# Patient Record
Sex: Male | Born: 1950
Health system: Southern US, Community
[De-identification: ages and names within clinical notes are randomized; demographics above are authoritative.]

## PROBLEM LIST (undated history)

## (undated) DIAGNOSIS — Z860100 Personal history of colon polyps, unspecified: Secondary | ICD-10-CM

## (undated) DIAGNOSIS — I480 Paroxysmal atrial fibrillation: Secondary | ICD-10-CM

## (undated) DIAGNOSIS — M75102 Unspecified rotator cuff tear or rupture of left shoulder, not specified as traumatic: Secondary | ICD-10-CM

## (undated) DIAGNOSIS — I2510 Atherosclerotic heart disease of native coronary artery without angina pectoris: Secondary | ICD-10-CM

## (undated) DIAGNOSIS — J309 Allergic rhinitis, unspecified: Secondary | ICD-10-CM

## (undated) DIAGNOSIS — T8859XA Other complications of anesthesia, initial encounter: Secondary | ICD-10-CM

## (undated) DIAGNOSIS — M1990 Unspecified osteoarthritis, unspecified site: Secondary | ICD-10-CM

## (undated) DIAGNOSIS — Z9889 Other specified postprocedural states: Secondary | ICD-10-CM

## (undated) DIAGNOSIS — R112 Nausea with vomiting, unspecified: Secondary | ICD-10-CM

## (undated) DIAGNOSIS — T4145XA Adverse effect of unspecified anesthetic, initial encounter: Secondary | ICD-10-CM

## (undated) HISTORY — PX: CARDIAC CATHETERIZATION: SHX172

## (undated) HISTORY — PX: POLYPECTOMY: SHX149

## (undated) HISTORY — PX: COLONOSCOPY: SHX174

## (undated) HISTORY — DX: Atherosclerotic heart disease of native coronary artery without angina pectoris: I25.10

## (undated) HISTORY — PX: KNEE ARTHROSCOPY: SUR90

---
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
## 1988-11-24 HISTORY — PX: CARPAL TUNNEL RELEASE: SHX101

## 2004-05-10 ENCOUNTER — Encounter: Admission: RE | Admit: 2004-05-10 | Discharge: 2004-05-10 | Payer: Self-pay | Admitting: Family Medicine

## 2008-06-16 ENCOUNTER — Ambulatory Visit: Payer: Self-pay | Admitting: Gastroenterology

## 2008-06-30 ENCOUNTER — Ambulatory Visit: Payer: Self-pay | Admitting: Gastroenterology

## 2008-06-30 ENCOUNTER — Encounter: Payer: Self-pay | Admitting: Gastroenterology

## 2008-07-12 ENCOUNTER — Encounter: Payer: Self-pay | Admitting: Gastroenterology

## 2011-01-03 ENCOUNTER — Ambulatory Visit (INDEPENDENT_AMBULATORY_CARE_PROVIDER_SITE_OTHER): Payer: 59 | Admitting: Internal Medicine

## 2011-01-03 ENCOUNTER — Encounter: Payer: Self-pay | Admitting: Internal Medicine

## 2011-01-03 ENCOUNTER — Encounter (INDEPENDENT_AMBULATORY_CARE_PROVIDER_SITE_OTHER): Payer: Self-pay | Admitting: *Deleted

## 2011-01-03 ENCOUNTER — Other Ambulatory Visit: Payer: Self-pay | Admitting: Internal Medicine

## 2011-01-03 ENCOUNTER — Other Ambulatory Visit: Payer: 59

## 2011-01-03 DIAGNOSIS — Z23 Encounter for immunization: Secondary | ICD-10-CM

## 2011-01-03 DIAGNOSIS — J309 Allergic rhinitis, unspecified: Secondary | ICD-10-CM | POA: Insufficient documentation

## 2011-01-03 DIAGNOSIS — N529 Male erectile dysfunction, unspecified: Secondary | ICD-10-CM | POA: Insufficient documentation

## 2011-01-03 DIAGNOSIS — D126 Benign neoplasm of colon, unspecified: Secondary | ICD-10-CM | POA: Insufficient documentation

## 2011-01-03 DIAGNOSIS — Z0000 Encounter for general adult medical examination without abnormal findings: Secondary | ICD-10-CM

## 2011-01-03 DIAGNOSIS — M1710 Unilateral primary osteoarthritis, unspecified knee: Secondary | ICD-10-CM | POA: Insufficient documentation

## 2011-01-03 DIAGNOSIS — IMO0002 Reserved for concepts with insufficient information to code with codable children: Secondary | ICD-10-CM | POA: Insufficient documentation

## 2011-01-03 DIAGNOSIS — E785 Hyperlipidemia, unspecified: Secondary | ICD-10-CM

## 2011-01-03 DIAGNOSIS — M1990 Unspecified osteoarthritis, unspecified site: Secondary | ICD-10-CM | POA: Insufficient documentation

## 2011-01-03 DIAGNOSIS — Z125 Encounter for screening for malignant neoplasm of prostate: Secondary | ICD-10-CM

## 2011-01-03 LAB — CBC WITH DIFFERENTIAL/PLATELET
Basophils Relative: 0.6 % (ref 0.0–3.0)
Eosinophils Absolute: 0.4 10*3/uL (ref 0.0–0.7)
Eosinophils Relative: 4.6 % (ref 0.0–5.0)
HCT: 47.3 % (ref 39.0–52.0)
MCV: 93.6 fl (ref 78.0–100.0)
Monocytes Relative: 7.1 % (ref 3.0–12.0)
Neutrophils Relative %: 55.6 % (ref 43.0–77.0)
Platelets: 199 10*3/uL (ref 150.0–400.0)

## 2011-01-03 LAB — BASIC METABOLIC PANEL
BUN: 19 mg/dL (ref 6–23)
Calcium: 9.7 mg/dL (ref 8.4–10.5)
Chloride: 102 mEq/L (ref 96–112)
Creatinine, Ser: 1 mg/dL (ref 0.4–1.5)
Glucose, Bld: 92 mg/dL (ref 70–99)
Potassium: 3.9 mEq/L (ref 3.5–5.1)

## 2011-01-03 LAB — LIPID PANEL
Cholesterol: 212 mg/dL — ABNORMAL HIGH (ref 0–200)
Triglycerides: 237 mg/dL — ABNORMAL HIGH (ref 0.0–149.0)

## 2011-01-03 LAB — PSA: PSA: 0.46 ng/mL (ref 0.10–4.00)

## 2011-01-15 NOTE — Assessment & Plan Note (Signed)
Summary: NEW [facility] PT--#--STC   Vital Signs:  Patient profile:   60 year old male Height:      68 inches Weight:      205 pounds BMI:     31.28 O2 Sat:      97 % on Room air Temp:     98.5 degrees F oral Pulse rate:   70 / minute BP sitting:   128 / 82  (left arm) Cuff size:   regular  Vitals Entered By: Bill Salinas CMA (January 03, 2011 1:33 PM)  O2 Flow:  Room air CC: New pt here to est care with primary md/ ab   Primary Care Provider:  Illene Regulus  CC:  New pt here to est care with primary md/ ab.  History of Present Illness: Mr. Lienhard presents to establsih for on-going continuity care.   He has a place in the right inguinal area which is intermittently raised.  He always feels tired and not rested. Some snoring. No report of apnea. Just as tired in the am as at bedtime. does not fall asleep during the day. He also reports some cognitive decline: memory loss and decreased mental edge.   Erectile dysfunction, semi-hard erections.   Preventive Screening-Counseling & Management  Alcohol-Tobacco     Alcohol drinks/day: 0     Smoking Status: never  Caffeine-Diet-Exercise     Caffeine use/day: 5-6 cups daily     Does Patient Exercise: no  Hep-HIV-STD-Contraception     Dental Visit-last 6 months yes     Sun Exposure-Excessive: no  Safety-Violence-Falls     Seat Belt Use: yes     Helmet Use: n/a     Firearms in the Home: firearms in the home     Smoke Detectors: yes     Violence in the Home: no risk noted     Sexual Abuse: no     Fall Risk: low  Current Medications (verified): 1)  None  Allergies (verified): No Known Drug Allergies  Past History:  Past Medical History: UCD-varicella? 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 marriage - good healthSmoking Status:  never Caffeine use/day:  5-6 cups daily Does Patient Exercise:  no Dental Care w/in 6 mos.:  yes Sun Exposure-Excessive:  no Seat Belt Use:  yes Fall Risk:  low  Review of Systems       The patient complains of decreased hearing.  The patient denies anorexia, fever, weight loss, weight gain, vision loss, chest pain, syncope, dyspnea on exertion, peripheral edema, headaches, abdominal pain, severe indigestion/heartburn, incontinence, muscle weakness, difficulty walking, depression, abnormal bleeding, enlarged lymph nodes, and angioedema.    Physical Exam  General:  Well-developed,well-nourished,in no acute distress; alert,appropriate and cooperative throughout examination Head:  normocephalic and atraumatic.   Eyes:  vision grossly intact, pupils equal, pupils round, corneas and lenses clear, and no injection.   Ears:  External ear exam shows no significant lesions or  deformities.  Otoscopic examination reveals clear canals, tympanic membranes are intact bilaterally without bulging, retraction, inflammation or discharge. Hearing is grossly normal bilaterally. Nose:  no external deformity, no external erythema, and no nasal discharge.   Mouth:  Oral mucosa and oropharynx without lesions or exudates.  Teeth in good repair. Neck:  supple, full ROM, no thyromegaly, and no carotid bruits.   Chest Wall:  no deformities.   Lungs:  Normal respiratory effort, chest expands symmetrically. Lungs are clear to auscultation, no crackles or wheezes. Heart:  Normal rate and regular rhythm. S1 and S2 normal without gallop, murmur, click, rub or  other extra sounds. Abdomen:  soft, non-tender, normal bowel sounds, no distention, no guarding, and no rebound tenderness.   Visible bulge right groin. Palpable right inquinal hernia, good size, soft, non-tender and reducible  Rectal:  no external abnormalities and normal sphincter tone.   Prostate:  no gland enlargement, no nodules, no asymmetry, and no induration.   Msk:  normal ROM, no joint tenderness, no joint swelling, no joint warmth, and no joint instability.   Pulses:  2+ radial and DP pulses Extremities:  No clubbing, cyanosis, edema, or deformity noted with normal full range of motion of all joints.   Neurologic:  alert & oriented X3, cranial nerves II-XII intact, strength normal in all extremities, gait normal, and DTRs symmetrical and normal.   Skin:  turgor normal, color normal, no suspicious lesions, and no ulcerations.   Cervical Nodes:  no anterior cervical adenopathy and no posterior cervical adenopathy.   Inguinal Nodes:  no R inguinal adenopathy and no L inguinal adenopathy.   Psych:  Oriented X3, memory intact for recent and remote, normally interactive, good eye contact, and not anxious appearing.     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   Plan - for any joint pain recommend APAP as first line of treatment, to use NSAIDs on if APAP fails.   Problem # 2:  ERECTILE DYSFUNCTION, ORGANIC (ICD-607.84) Able to attain erection but maintaining erection is subpar.   Plan - testosterone level with recommendations to follow.   Addendum- testosterone level is normal. No need for treatment  Problem # 3:  COLONIC POLYPS (ICD-211.3) Patient with a history of colon polyps. Last colonoscopy '09 with Dr. Russella Dar. Will rely on GI call back for next study.  Problem # 4:  ALLERGIC RHINITIS CAUSE UNSPECIFIED (ICD-477.9) Long term problem. If otc non-sedating antihistamines,  e.g. claritin orallegra, don't work can consider nasal inhalational steroids as a treatment.   Problem # 5:  Preventive Health Care (ICD-V70.0) History as outline with no major limiting ailments. Exam is unremarkable. Lab......[facility] for colorectal cancer screening. Normal prostate exam and lab is ordered.  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 He is a candidate for pneumonia and shingles vaccine - will await review of records from his previous doctor.   IN summry - a very nice gentleman who is established for on-going care. He is oriented to the office processes and services provided. He will return as needed or in 4 months for follow-up.  Other Orders: TLB-Lipid Panel (80061-LIPID) TLB-CBC Platelet - w/Differential (85025-CBCD) TLB-BMP (Basic Metabolic Panel-BMET) (80048-METABOL) TLB-TSH (Thyroid Stimulating Hormone) (84443-TSH) TLB-Testosterone, Total (84403-TESTO) TLB-PSA (Prostate Specific Antigen) (84153-PSA) Tdap => 64yrs IM (62831) Admin 1st Vaccine (51761)  Patient: Bradley Gilbert Note: All result  statuses are Final unless otherwise noted.  Tests: (1) Lipid Panel (LIPID)   Cholesterol          [H]  212 mg/dL                   1-191     ATP III Classification            Desirable:  < 200 mg/dL                    Borderline High:  200 - 239 mg/dL               High:  > = 240 mg/dL   Triglycerides        [H]  237.0 mg/dL                 4.7-829.5     Normal:  <150 mg/dL     Borderline High:  621 - 199 mg/dL   HDL                       30.86 mg/dL                 >57.84   VLDL Cholesterol     [H]  47.4 mg/dL                  6.9-62.9  CHO/HDL Ratio:  CHD Risk                             4                    Men          Women     1/2 Average Risk     3.4          3.3     Average Risk          5.0          4.4     2X Average Risk          9.6          7.1     3X Average Risk          15.0          11.0                            Tests: (2) CBC Platelet w/Diff (CBCD)   White Cell Count          8.3 K/uL                    4.5-10.5   Red Cell Count            5.05 Mil/uL                 4.22-5.81   Hemoglobin                16.3 g/dL                   52.8-41.3   Hematocrit                47.3 %                      39.0-52.0   MCV  93.6 fl                     78.0-100.0   MCHC                      34.6 g/dL                   03.4-74.2   RDW                       12.9 %                      11.5-14.6   Platelet Count            199.0 K/uL                  150.0-400.0   Neutrophil %              55.6 %                      43.0-77.0   Lymphocyte %              32.1 %                      12.0-46.0   Monocyte %                7.1 %                       3.0-12.0   Eosinophils%              4.6 %                       0.0-5.0   Basophils %               0.6 %                       0.0-3.0   Neutrophill Absolute      4.6 K/uL                    1.4-7.7   Lymphocyte Absolute       2.7 K/uL                    0.7-4.0   Monocyte Absolute         0.6 K/uL                    0.1-1.0  Eosinophils, Absolute                             0.4 K/uL                    0.0-0.7   Basophils Absolute        0.0 K/uL                    0.0-0.1  Tests: (3) BMP (METABOL)   Sodium                    139 mEq/L                   135-145   Potassium  3.9 mEq/L                   3.5-5.1   Chloride                  102 mEq/L                   96-112   Carbon Dioxide            30 mEq/L                    19-32   Glucose                   92 mg/dL                    16-10   BUN                       19 mg/dL                    9-60   Creatinine                1.0 mg/dL                   4.5-4.0   Calcium                   9.7 mg/dL                   9.8-11.9   GFR                       83.57 mL/min                >60.00  Tests: (4) TSH (TSH)   FastTSH                   1.63  uIU/mL                 0.35-5.50  Tests: (5) Testosterone, Total (TESTO)   Testosterone              416.48 ng/dL                147.82-956.21  Tests: (6) Prostate Specific Antigen (PSA)   PSA-Hyb                   0.46 ng/mL                  0.10-4.00  Tests: (7) Cholesterol LDL - Direct (DIRLDL)  Cholesterol LDL - Direct                             133.1 mg/dL     Optimal:  <308 mg/dL     Near or Above Optimal:  100-129 mg/dL     Borderline High:  657-846 mg/dL     High:  962-952 mg/dL     Very High:  >841 mg/dL  Orders Added: 1)  TLB-Lipid Panel [80061-LIPID] 2)  TLB-CBC Platelet - w/Differential [85025-CBCD] 3)  TLB-BMP (Basic Metabolic Panel-BMET) [80048-METABOL] 4)  TLB-TSH (Thyroid Stimulating Hormone) [84443-TSH] 5)  TLB-Testosterone, Total [84403-TESTO] 6)  TLB-PSA (Prostate Specific Antigen) [84153-PSA] 7)  Tdap => 72yrs IM [90715] 8)  Admin 1st Vaccine [90471] 9)  New Patient 40-64 years [32440]   Immunizations Administered:  Tetanus Vaccine:    Vaccine Type: Tdap    Site: left deltoid    Mfr: GlaxoSmithKline    Dose: 0.5 ml    Route: IM    Given by: Ami Bullins CMA    Exp. Date: 09/12/2012    Lot #: [id number]    VIS given: 10/11/08 version given January 03, 2011.   Immunizations Administered:  Tetanus Vaccine:    Vaccine Type: Tdap    Site: left deltoid    Mfr: GlaxoSmithKline    Dose: 0.5 ml    Route: IM    Given by: Ami Bullins CMA    Exp. Date: 09/12/2012    Lot #: [id number]    VIS given: 10/11/08 version given January 03, 2011.

## 2011-03-14 ENCOUNTER — Ambulatory Visit (INDEPENDENT_AMBULATORY_CARE_PROVIDER_SITE_OTHER): Payer: 59 | Admitting: Internal Medicine

## 2011-03-14 VITALS — BP 132/78 | HR 68 | Temp 98.9°F | Resp 14 | Wt 204.5 lb

## 2011-03-14 DIAGNOSIS — T148 Other injury of unspecified body region: Secondary | ICD-10-CM

## 2011-03-14 DIAGNOSIS — W57XXXA Bitten or stung by nonvenomous insect and other nonvenomous arthropods, initial encounter: Secondary | ICD-10-CM

## 2011-03-14 DIAGNOSIS — G4700 Insomnia, unspecified: Secondary | ICD-10-CM

## 2011-03-14 MED ORDER — DOXYCYCLINE HYCLATE 100 MG PO TABS: 100.0000 mg | ORAL_TABLET | Freq: Two times a day (BID) | ORAL | Status: AC

## 2011-03-16 ENCOUNTER — Encounter: Payer: Self-pay | Admitting: Internal Medicine

## 2011-03-16 NOTE — Progress Notes (Signed)
Subjective:    Patient ID: Bradley Gilbert, male    DOB: 1951-01-06, 60 y.o.   MRN: [id number]  HPI Bradley Gilbert presents with fever, headache and malaise. He has had several tick bites: one on the arm and 3 in the right groin. He has a h/o RMSF and at that time did not have a rash. He has no rash today either. He also reports a very poor sleep habit recently: a duration problem without latency issues.  Past Medical History  Diagnosis Date  . Benign neoplasm of colon   . Allergic rhinitis, cause unspecified   . Osteoarthrosis, unspecified whether generalized or localized, unspecified site   . Osteoarthrosis, unspecified whether generalized or localized, lower leg    No past surgical history on file. Family History  Problem Relation Age of Onset  . Diabetes Mother   . Heart disease Father   . Cancer Father   . COPD Father     throat  . Colon cancer Neg Hx   . Prostate cancer Neg Hx    History   Social History  . Marital Status: Married    Spouse Name: N/A    Number of Children: N/A  . 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 Smoking status: Not on file  . Smokeless tobacco: Not on file  . Alcohol Use: Not on file  . Drug Use: Not on file  . Sexually Active: Not on file   Other Topics Concern  . Not on file   Social History Narrative   Graduate of [employer].  Married - '85.  1 son - '70,  2 daughters - '74, '87;   5 grandchildren.  Hobbies - keeps horses and ponies.  Marriage - good health        Review of Systems Review of Systems  Constitutional: Positive for fever, chill. No change in activity and no unexpected weight change.  HENT:  Negative for hearing loss, ear pain, congestion, neck stiffness and postnasal drip.   Eyes: Negative for pain, discharge and visual disturbance.  Respiratory: Negative for chest tightness and wheezing.   Cardiovascular: Negative for chest pain and  palpitations.       [No decreased exercise tolerance Gastrointestinal: [No change in bowel habit. No bloating or gas. No reflux or indigestion Genitourinary: Negative for urgency, frequency, flank pain and difficulty urinating.  Musculoskeletal:  Positive for myalgias, back pain, arthralgias.  Negative for gait problem.  Neurological: Negative for dizziness, tremors, weakness and headaches.  Hematological: Negative for adenopathy.  Psychiatric/Behavioral: Negative for behavioral problems and dysphoric mood.       Objective:   Physical Exam  Vitals reviewed. Constitutional: He is oriented to person, place, and time. He appears well-developed and well-nourished. No distress.  HENT:  Head: Normocephalic and atraumatic.  Eyes: Conjunctivae and EOM are normal. Pupils are equal, round, and reactive to light.  Neck: Neck supple.  Cardiovascular: Normal rate and regular rhythm.   Pulmonary/Chest: Effort normal and breath sounds normal.  Abdominal: Soft. Bowel sounds are normal. He exhibits no distension. There is no guarding.  Musculoskeletal: Normal range of motion. He exhibits no edema and no tenderness.  Neurological: He is alert and oriented to person, place, and time. He has normal reflexes. No cranial nerve deficit.  Skin: Skin is warm and dry. No rash noted.  Psychiatric: He has a normal mood and affect. Thought content normal.  Assessment & Plan:  1. RMSF - patient with tick exposure and a history of RMSF that presented without rash. He feels his symptoms are very much the same.  Plan - doxycycline 100mg  bid x 7 days.  2. Insomnia - sleep duration issues. Reviewed sleep hygiene with him in detail, especially to avoid extinction behaviors. No medication provided.

## 2011-03-17 ENCOUNTER — Other Ambulatory Visit: Payer: Self-pay | Admitting: Internal Medicine

## 2011-03-17 DIAGNOSIS — K4090 Unilateral inguinal hernia, without obstruction or gangrene, not specified as recurrent: Secondary | ICD-10-CM | POA: Insufficient documentation

## 2011-07-10 ENCOUNTER — Telehealth: Payer: Self-pay | Admitting: *Deleted

## 2011-07-10 MED ORDER — DOXYCYCLINE HYCLATE 100 MG PO TABS: 100.0000 mg | ORAL_TABLET | Freq: Two times a day (BID) | ORAL | Status: AC

## 2011-07-10 NOTE — Telephone Encounter (Signed)
Called patient. He does not know if the ticks had fed - no blood when they were removed from his scalp in the shower. 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

## 2011-07-10 NOTE — Telephone Encounter (Signed)
Pt states that removed 2 ticks from his scalp about 2 weeks ago. He is now having sxs of reoccurring HA and neck stiffness. Pt has OV schedule for tomorrow but wanted to know if ATB could be sent to pharmacy today-please advise

## 2011-07-11 ENCOUNTER — Ambulatory Visit: Payer: 59 | Admitting: Internal Medicine

## 2011-07-18 ENCOUNTER — Telehealth: Payer: Self-pay | Admitting: *Deleted

## 2011-07-18 NOTE — Telephone Encounter (Signed)
Was treated w/ doxycycline for possible RMSF. Agree with advice.

## 2011-07-18 NOTE — Telephone Encounter (Signed)
Pt is scheduled for f/u OV Monday. He was given RX for abx for possible tick illness after a bite. Pt continues to c/o h/a x 2 - 3 wks, stiff neck and back ache. Advised him to call and speak w/on-call service w/any change in symptoms.

## 2011-07-21 ENCOUNTER — Ambulatory Visit (INDEPENDENT_AMBULATORY_CARE_PROVIDER_SITE_OTHER): Payer: 59 | Admitting: Internal Medicine

## 2011-07-21 VITALS — BP 136/84 | HR 63 | Temp 98.4°F | Wt 210.0 lb

## 2011-07-21 DIAGNOSIS — R0609 Other forms of dyspnea: Secondary | ICD-10-CM

## 2011-07-21 DIAGNOSIS — R6889 Other general symptoms and signs: Secondary | ICD-10-CM

## 2011-07-21 DIAGNOSIS — Z136 Encounter for screening for cardiovascular disorders: Secondary | ICD-10-CM

## 2011-07-21 DIAGNOSIS — R0989 Other specified symptoms and signs involving the circulatory and respiratory systems: Secondary | ICD-10-CM

## 2011-07-21 NOTE — Progress Notes (Signed)
Subjective:    Patient ID: Bradley Gilbert, male    DOB: [birth year], 60 y.o.   MRN: [id number]  HPI Mr. Jeudy presents for evaluation of persistent neck stiffness and minor headache.  He did have tick exposure about 2-3 weeks ago and was started on doxycycline 100mg  bid x 7 days starting about 10 days after exposure. He has not had any rash or severe headache or other symptoms of RMSF or Lyme's disease. He has had prior case of RMSF.  Mr. Breton reports that about 10 days ago while out working he experienced the on-set of profound diaphoresis along with weakness. He thought he might have heat exhaustion and went to the house. He reports that he was slow to recover and felt "wiped out" the remainder of the weekend. sinc that time he has had decreased exercise tolerance and endurance is down. He denies any chest pain or discomfort. He will have increase DOE with normal activities. Risk profile is low: no DM, no lipid function elevation, no obese. He is 59.  Past Medical History  Diagnosis Date  . Benign neoplasm of colon   . Allergic rhinitis, cause unspecified   . Osteoarthrosis, unspecified whether generalized or localized, unspecified site   . Osteoarthrosis, unspecified whether generalized or localized, lower leg    No past surgical history on file. Family History  Problem Relation Age of Onset  . Diabetes Mother   . Heart disease Father   . Cancer Father   . COPD Father     throat  . Colon cancer Neg Hx   . Prostate cancer Neg Hx    History   Social History  . Marital Status: Married    Spouse Name: N/A    Number of Children: N/A  . 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 Smoking status: Not on file  . Smokeless tobacco: Not on file  . Alcohol Use: Not on file  . Drug Use: Not on file  . Sexually Active: Not on file   Other Topics Concern  . Not on file   Social History Narrative   Graduate of [employer].  Married - '85.  1 son - '70,  2 daughters - '74, '87;   5 grandchildren.  Hobbies - keeps horses and ponies.  Marriage - good health       Review of Systems Review of Systems  Constitutional:  Negative for fever, chills, activity change and unexpected weight change.  HEENT:  Negative for hearing loss, ear pain, congestion, neck stiffness and postnasal drip. Negative for sore throat or swallowing problems. Negative for dental complaints.   Eyes: Negative for vision loss or change in visual acuity.  Respiratory: Negative for chest tightness and wheezing.   Cardiovascular: Negative for chest pain and palpitation. Positive for decreased exercise tolerance and DOE Gastrointestinal: No change in bowel habit. No bloating or gas. No reflux or indigestion Genitourinary: Negative for urgency, frequency, flank pain and difficulty urinating.  Musculoskeletal: Negative for myalgias, back pain, arthralgias and gait problem.  Neurological: Negative for dizziness, tremors, weakness and headaches.  Hematological: Negative for adenopathy.  Psychiatric/Behavioral: Negative for behavioral problems and dysphoric mood.       Objective:   Physical Exam Vitals noted - stable Gen'l - WNWD white male in no distress HEENT- normal Neck - supple with full ROM but with crepitus with extension and head rolls Chest - CTAP Cor -  2+ radial pulse, RRR, no JVD Abdomen BS+ Extremities - no deformity Derm - no rash or lesions  12 Lead EKG no injury pattern.       Assessment & Plan:  1. 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 Doubt infectious etiology  Plan - ROM exercises daily  2. Decreased exercise tolerance - patient with an event which was exaggerated in terms of heat exhaustion. The combination of symptoms at the time and the persistent decreased exercise tolerance that has persisted are very worrisome for atypical angina.   Plan Myoview stress test.

## 2011-08-11 ENCOUNTER — Ambulatory Visit (HOSPITAL_COMMUNITY): Payer: 59 | Attending: Internal Medicine | Admitting: Radiology

## 2011-08-11 DIAGNOSIS — R0989 Other specified symptoms and signs involving the circulatory and respiratory systems: Secondary | ICD-10-CM | POA: Insufficient documentation

## 2011-08-11 DIAGNOSIS — R0602 Shortness of breath: Secondary | ICD-10-CM

## 2011-08-11 DIAGNOSIS — R6889 Other general symptoms and signs: Secondary | ICD-10-CM

## 2011-08-11 DIAGNOSIS — R0609 Other forms of dyspnea: Secondary | ICD-10-CM | POA: Insufficient documentation

## 2011-08-11 MED ORDER — TECHNETIUM TC 99M TETROFOSMIN IV KIT
33.0000 | PACK | Freq: Once | INTRAVENOUS | Status: AC | PRN
  Administered 2011-08-11: 33 via INTRAVENOUS

## 2011-08-11 MED ORDER — TECHNETIUM TC 99M TETROFOSMIN IV KIT
11.0000 | PACK | Freq: Once | INTRAVENOUS | Status: AC | PRN
  Administered 2011-08-11: 11 via INTRAVENOUS

## 2011-08-11 NOTE — Progress Notes (Signed)
[facility] SITE 3 NUCLEAR MED [address] [place] [address] [phone]  Cardiology Nuclear Med Study  Bradley Gilbert is a 60 y.o. male [id number] 04-Aug-1951   Nuclear Med Background Indication for Stress Test:  Evaluation for Ischemia History:  No previous documented CAD Cardiac Risk Factors: Family History - CAD  Symptoms:  Diaphoresis, DOE and Fatigue   Nuclear Pre-Procedure Caffeine/Decaff Intake:  None NPO After: 7:00pm   Lungs:  Clear. IV 0.9% NS with Angio Cath:  20g  IV Site: R Antecubital x 1, tolerated well IV Started by:  Irean Hong, RN  Chest Size (in):  42 Cup Size: n/a  Height: 5\' 10"  (1.778 m)  Weight:  204 lb (92.534 kg)  BMI:  Body mass index is 29.27 kg/(m^2). Tech Comments:  n/a    Nuclear Med Study 1 or 2 day study: 1 day  Stress Test Type:  Stress  Reading MD: Olga Millers, MD  Order Authorizing Provider:  Illene Regulus, MD  Resting Radionuclide: Technetium 54m Tetrofosmin  Resting Radionuclide Dose: 11 mCi   Stress Radionuclide:  Technetium 48m Tetrofosmin  Stress Radionuclide Dose: 32.2 mCi           Stress Protocol Rest HR: 61 Stress HR: 153  Rest BP: 134/81 Stress BP: 181/74  Exercise Time (min): 10:01 METS: 11.7   Predicted Max HR: 159 bpm % Max HR: 96.23 bpm Rate Pressure Product: 46962   Dose of Adenosine (mg):  n/a Dose of Lexiscan: n/a mg  Dose of Atropine (mg): n/a Dose of Dobutamine: n/a mcg/kg/min (at max HR)  Stress Test Technologist: Smiley Houseman, CMA-N  Nuclear Technologist:  Domenic Polite, CNMT     Rest Procedure:  Myocardial perfusion imaging was performed at rest 45 minutes following the intravenous administration of Technetium 31m Tetrofosmin.  Rest ECG: No acute changes, rare PVC.  Stress Procedure:  The patient exercised for 10:01 on the treadmill utilizing the Bruce protocol.  The patient stopped due to fatigue and denied any chest pain.  There were marked ST-T wave changes with  occasional PVC's and couplets.  ST changes quickly resolved.  Technetium 82m Tetrofosmin was injected at peak exercise and myocardial perfusion imaging was performed after a brief delay.  Stress ECG: Significant ST abnormalities consistent with ischemia.  QPS Raw Data Images:  Acquisition technically good; normal left ventricular size. Stress Images:  There is decreased uptake in the inferior wall. Rest Images:  Normal homogeneous uptake in all areas of the myocardium. Subtraction (SDS):  These findings are consistent with mild inferior ischemia noted on the short axis images only. Transient Ischemic Dilatation (Normal <1.22): .84 Lung/Heart Ratio (Normal <0.45):  .34  Quantitative Gated Spect Images QGS EDV:  102 ml QGS ESV:  34 ml QGS cine images:  NL LV Function; NL Wall Motion QGS EF: 66%  Impression Exercise Capacity:  Good exercise capacity. BP Response:  Normal blood pressure response. Clinical Symptoms:  No chest pain. ECG Impression: Significant ST abnormalities consistent with ischemia. Comparison with Prior Nuclear Study: No previous nuclear studies. Abnormal Stress nuclear study Olga Millers

## 2011-08-12 ENCOUNTER — Telehealth: Payer: Self-pay | Admitting: *Deleted

## 2011-08-12 DIAGNOSIS — R9439 Abnormal result of other cardiovascular function study: Secondary | ICD-10-CM

## 2011-08-12 NOTE — Telephone Encounter (Signed)
Card cons Dx abn CL Done Thx

## 2011-08-12 NOTE — Telephone Encounter (Signed)
[facility] [facility]ology called to notify MD of abnormal stress test. Results are ready, please advise in Dr Debby Bud absence.

## 2011-08-13 NOTE — Telephone Encounter (Signed)
Pt scheduled for Apt w/Cardiology on 9/26.

## 2011-08-13 NOTE — Telephone Encounter (Signed)
Patient informed and PCC's notified

## 2011-08-15 ENCOUNTER — Telehealth: Payer: Self-pay | Admitting: Cardiovascular Disease

## 2011-08-15 ENCOUNTER — Emergency Department (HOSPITAL_COMMUNITY): Payer: 59

## 2011-08-15 ENCOUNTER — Observation Stay (HOSPITAL_COMMUNITY)
Admission: EM | Admit: 2011-08-15 | Discharge: 2011-08-16 | Disposition: A | Discharge status: Home / Self Care | Payer: 59 | Attending: Internal Medicine | Admitting: Internal Medicine

## 2011-08-15 ENCOUNTER — Telehealth: Payer: Self-pay | Admitting: *Deleted

## 2011-08-15 DIAGNOSIS — R61 Generalized hyperhidrosis: Secondary | ICD-10-CM | POA: Insufficient documentation

## 2011-08-15 DIAGNOSIS — Z01818 Encounter for other preprocedural examination: Secondary | ICD-10-CM | POA: Insufficient documentation

## 2011-08-15 DIAGNOSIS — R079 Chest pain, unspecified: Principal | ICD-10-CM | POA: Insufficient documentation

## 2011-08-15 DIAGNOSIS — R42 Dizziness and giddiness: Secondary | ICD-10-CM | POA: Insufficient documentation

## 2011-08-15 DIAGNOSIS — Z01812 Encounter for preprocedural laboratory examination: Secondary | ICD-10-CM | POA: Insufficient documentation

## 2011-08-15 DIAGNOSIS — R5381 Other malaise: Secondary | ICD-10-CM | POA: Insufficient documentation

## 2011-08-15 DIAGNOSIS — R9439 Abnormal result of other cardiovascular function study: Secondary | ICD-10-CM | POA: Insufficient documentation

## 2011-08-15 DIAGNOSIS — R0602 Shortness of breath: Secondary | ICD-10-CM | POA: Insufficient documentation

## 2011-08-15 DIAGNOSIS — F29 Unspecified psychosis not due to a substance or known physiological condition: Secondary | ICD-10-CM | POA: Insufficient documentation

## 2011-08-15 LAB — PROTIME-INR
INR: 1.18 (ref 0.00–1.49)
Prothrombin Time: 15.3 seconds — ABNORMAL HIGH (ref 11.6–15.2)

## 2011-08-15 LAB — COMPREHENSIVE METABOLIC PANEL
ALT: 25 U/L (ref 0–53)
AST: 20 U/L (ref 0–37)
BUN: 14 mg/dL (ref 6–23)
Calcium: 9.3 mg/dL (ref 8.4–10.5)
Chloride: 104 mEq/L (ref 96–112)
Creatinine, Ser: 0.94 mg/dL (ref 0.50–1.35)
GFR calc Af Amer: >60 mL/min (ref >60)

## 2011-08-15 LAB — CBC
HCT: 42.2 % (ref 39.0–52.0)
MCHC: 37 g/dL — ABNORMAL HIGH (ref 30.0–36.0)
RBC: 4.84 MIL/uL (ref 4.22–5.81)

## 2011-08-15 LAB — APTT: aPTT: 132 seconds — ABNORMAL HIGH (ref 24–37)

## 2011-08-15 LAB — D-DIMER, QUANTITATIVE: D-Dimer, Quant: <0.22 ug/mL-FEU (ref 0.00–0.48)

## 2011-08-15 LAB — POCT I-STAT TROPONIN I

## 2011-08-15 NOTE — Telephone Encounter (Signed)
Pt and wife walked into the office. He c/o slight dizziness, lightheadedness, "gas in chest", slight chest pressure in center of chest, "jittery" x 1 hr. NO pain or SOB. Pt had abnormal stress test Monday 9/20 and scheduled for cardiology eval Monday 9/24. I advised pt to go to ER 5 minutes after he walked into the office. Gave him copy of stress test to take with him to the ER. Also advised them if he was admitted to ensure he was put on Dr Debby Bud or covering MD's service.   Wife will call office later with update.

## 2011-08-15 NOTE — Telephone Encounter (Signed)
Pt came to PCP's office, Wife is taking him to ER at [facility] now.

## 2011-08-15 NOTE — Telephone Encounter (Signed)
9/21--pt calling stating having funny feeling in chest, diaphoresis, and red face?--when i attempted to reach pt there was no answer on home or cell phone--LM on both numbers for pt to go to nearest ED  And have this checked out--nt

## 2011-08-15 NOTE — Telephone Encounter (Signed)
Pt calling stating that he is not feeling well. Pt c/o sweating and red face and pt feel funny in his chest. Pt wanted to come see Dr. Clifton James today. Pt was made aware he was not in the office today but wanted to speak to someone to be advised as to what to do.

## 2011-08-16 LAB — CBC
HCT: 40.6 % (ref 39.0–52.0)
MCH: 31.7 pg (ref 26.0–34.0)
MCHC: 35.7 g/dL (ref 30.0–36.0)
MCV: 88.6 fL (ref 78.0–100.0)
Platelets: 177 10*3/uL (ref 150–400)
RDW: 12.7 % (ref 11.5–15.5)

## 2011-08-16 LAB — BASIC METABOLIC PANEL
CO2: 26 mEq/L (ref 19–32)
GFR calc Af Amer: >60 mL/min (ref >60)
GFR calc non Af Amer: >60 mL/min (ref >60)
Sodium: 138 mEq/L (ref 135–145)

## 2011-08-16 LAB — LIPID PANEL
Cholesterol: 152 mg/dL (ref 0–200)
HDL: 40 mg/dL (ref >39)
Total CHOL/HDL Ratio: 3.8 RATIO

## 2011-08-16 LAB — TSH: TSH: 1.54 u[IU]/mL (ref 0.350–4.500)

## 2011-08-16 LAB — CARDIAC PANEL(CRET KIN+CKTOT+MB+TROPI)
Relative Index: INVALID (ref 0.0–2.5)
Troponin I: <0.3 ng/mL (ref <0.30)

## 2011-08-16 LAB — HEMOGLOBIN A1C: Mean Plasma Glucose: 123 mg/dL — ABNORMAL HIGH (ref <117)

## 2011-08-17 NOTE — Telephone Encounter (Signed)
Patient was admitted, had cath w/o obstructive disease. Does he already have post-hospital follow-up with cardiology? With me?

## 2011-08-17 NOTE — Telephone Encounter (Signed)
Admitted, cathed, discharged. See previous phone note

## 2011-08-18 ENCOUNTER — Other Ambulatory Visit: Payer: Self-pay | Admitting: *Deleted

## 2011-08-18 DIAGNOSIS — R0602 Shortness of breath: Secondary | ICD-10-CM

## 2011-08-18 NOTE — Telephone Encounter (Signed)
Reviewed hospital course. I have not seen this pt before. He was scheduled to see me for initial visit. Cath with mild CAD. cdm

## 2011-08-19 ENCOUNTER — Ambulatory Visit (HOSPITAL_COMMUNITY): Payer: 59 | Attending: Cardiology

## 2011-08-19 ENCOUNTER — Encounter: Payer: Self-pay | Admitting: *Deleted

## 2011-08-19 DIAGNOSIS — R5381 Other malaise: Secondary | ICD-10-CM | POA: Insufficient documentation

## 2011-08-19 DIAGNOSIS — R0602 Shortness of breath: Secondary | ICD-10-CM

## 2011-08-19 DIAGNOSIS — I079 Rheumatic tricuspid valve disease, unspecified: Secondary | ICD-10-CM | POA: Insufficient documentation

## 2011-08-19 DIAGNOSIS — R61 Generalized hyperhidrosis: Secondary | ICD-10-CM | POA: Insufficient documentation

## 2011-08-19 DIAGNOSIS — I059 Rheumatic mitral valve disease, unspecified: Secondary | ICD-10-CM | POA: Insufficient documentation

## 2011-08-19 DIAGNOSIS — R0989 Other specified symptoms and signs involving the circulatory and respiratory systems: Secondary | ICD-10-CM | POA: Insufficient documentation

## 2011-08-19 DIAGNOSIS — R0609 Other forms of dyspnea: Secondary | ICD-10-CM | POA: Insufficient documentation

## 2011-08-19 HISTORY — PX: TRANSTHORACIC ECHOCARDIOGRAM: SHX275

## 2011-08-20 ENCOUNTER — Encounter: Payer: Self-pay | Admitting: Cardiovascular Disease

## 2011-08-20 ENCOUNTER — Encounter: Payer: Self-pay | Admitting: *Deleted

## 2011-08-20 ENCOUNTER — Ambulatory Visit (INDEPENDENT_AMBULATORY_CARE_PROVIDER_SITE_OTHER): Payer: 59 | Admitting: Cardiovascular Disease

## 2011-08-20 VITALS — BP 136/88 | HR 86 | Ht 70.0 in | Wt 210.1 lb

## 2011-08-20 DIAGNOSIS — E785 Hyperlipidemia, unspecified: Secondary | ICD-10-CM | POA: Insufficient documentation

## 2011-08-20 DIAGNOSIS — E7800 Pure hypercholesterolemia, unspecified: Secondary | ICD-10-CM | POA: Insufficient documentation

## 2011-08-20 DIAGNOSIS — I2510 Atherosclerotic heart disease of native coronary artery without angina pectoris: Secondary | ICD-10-CM

## 2011-08-20 MED ORDER — ROSUVASTATIN CALCIUM 10 MG PO TABS: 10.0000 mg | ORAL_TABLET | Freq: Every day | ORAL | Status: DC

## 2011-08-20 NOTE — Progress Notes (Signed)
History of Present Illness:59 yo male with recent admission to [facility] 08/15/11 with c/o chest pain. He had an abnormal stress myoview earlier this month per primary care that showed possible inferior wall ischemia. Cardiac cath 08/15/11 with mild non-obstructive CAD. (30% mid LAD, o/w no CAD).   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 He had been drinking plenty of fluids. After this episode, the stress myoview was arranged by Dr. Debby Bud. His stress myoview on 08/11/11 showed normal LV function with mild dropout of the inferior wall with stress c/w ischemia. He had an episode of chest pain 08/15/11 which was concerning and because of this he went into the ED. Cath was later that day. Echo yesterday with normal LV function, no valvular disease. He was discharged on 08/16/11 and has done well since then.   No chest pain, SOB, palpitations.   Past Medical History  Diagnosis Date  . Benign neoplasm of colon   . Allergic rhinitis, cause unspecified   . Osteoarthrosis, unspecified whether generalized or localized, unspecified site   . Osteoarthrosis, unspecified whether generalized or localized, lower leg   . Coronary artery disease     Cath 08/15/11 with 30% mid LAD, o/w no CAD    Past Surgical History  Procedure Date  . Knee surgery     Current Outpatient Prescriptions  Medication Sig Dispense Refill  . aspirin 81 MG tablet Take 81 mg by mouth as needed.        . NON FORMULARY NO MEDICATIONS         No Known Allergies  History   Social History  . Marital Status: Married    Spouse Name: N/A    Number of Children: N/A  . 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 Smoking status: Never Smoker   . Smokeless tobacco: Not on file  . Alcohol Use: Not on file  . Drug Use: Not on file  . Sexually Active: Not on file   Other Topics  Concern  . Not on file   Social History Narrative   Graduate of [employer].  Married - '85.  1 son - '70,  2 daughters - '74, '87;   5 grandchildren.  Hobbies - keeps horses and ponies.  Marriage - good health    Family History  Problem Relation Age of Onset  . Diabetes Mother   . Heart disease Father   . Cancer Father   . COPD Father     throat  . Colon cancer Neg Hx   . Prostate cancer Neg Hx     Review of Systems:  As stated in the HPI and otherwise negative.   BP 136/88  Pulse 86  Ht 5\' 10"  (1.778 m)  Wt 210 lb 1.9 oz (95.31 kg)  BMI 30.15 kg/m2  Physical Examination: General: Well developed, well nourished, NAD HEENT: OP clear, mucus membranes moist SKIN: warm, dry. No rashes. Neuro: No focal deficits Musculoskeletal: Muscle strength 5/5 all ext Psychiatric: Mood and affect normal Neck: No JVD, no carotid bruits, no thyromegaly, no lymphadenopathy. Lungs:Clear bilaterally, no wheezes, rhonci, crackles Cardiovascular: Regular rate and rhythm. No murmurs, gallops or rubs. Abdomen:Soft. Bowel sounds present. Non-tender.  Extremities: No lower extremity edema. Pulses are 2 + in the bilateral DP/PT.  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  There was separate ostia for the   LAD and the left circumflex.      LAD:  A long vessel coursing to the apex.  It gave off two diagonals.   There was a 30% lesion in the midportion of the LAD right after the   second diagonal.      Left circumflex was a very large dominant vessel, gave off two large   OMs.  The large PL and the large PeA was angiographically normal.      The right coronary artery was a small nondominant vessel with no   obstruction.      Left ventriculogram done in the RAO position showed an EF of 60-65% with   no regional wall motion abnormalities.      Echo 08/19/11:  Left ventricle: The cavity size was normal. Wall thickness was increased in a pattern of mild LVH. Systolic  function was normal. The estimated ejection fraction was in the range of 60% to 65%. Wall motion was normal; there were no regional wall motion abnormalities. Doppler parameters are consistent with abnormal left ventricular relaxation (grade 1 diastolic dysfunction). - Aortic valve: There was no stenosis. - Mitral valve: Trivial regurgitation. - Right ventricle: The cavity size was normal. Systolic function was normal. - Pulmonary arteries: PA peak pressure: 25mm Hg (S). - Inferior vena cava: The vessel was normal in size; the respirophasic diameter changes were in the normal range (= 50%); findings are consistent with normal central venous pressure. Impressions:  - Normal LV size and systolic function, EF 60-65%. Normal RV size and systolic function. No significant valvular abnormalities.

## 2011-08-20 NOTE — Patient Instructions (Signed)
Your physician wants you to follow-up in: 12 months.  You will receive a reminder letter in the mail two months in advance. If you don't receive a letter, please call our office to schedule the follow-up appointment.  Your physician has recommended you make the following change in your medication: Start Crestor 10 mg by mouth daily.  Your physician recommends that you return for fasting lab work in: 3 months--Last week of December or first week in [place] and Liver profile

## 2011-08-20 NOTE — Assessment & Plan Note (Signed)
Stable. No recurrence of chest pain. Will add statin (Crestor 10 mg po Qdaily) and have him start ASA 81 mg po Qdaily. Heart healthy diet and daily exercise encouraged.

## 2011-08-20 NOTE — Assessment & Plan Note (Signed)
Start statin. Repeat LFTs and lipids in 3 months. Goal LDL 70.

## 2011-08-21 ENCOUNTER — Encounter: Payer: Self-pay | Admitting: *Deleted

## 2011-08-21 NOTE — H&P (Addendum)
NAME:  Bradley Gilbert, Bradley Gilbert NO.:  [id number]  MEDICAL RECORD NO.:  [id number]  LOCATION:  2508                         FACILITY:  [facility]  PHYSICIAN:  Bevelyn Buckles. Travia Onstad, MDDATE OF BIRTH:  1951/06/26  DATE OF ADMISSION:  08/15/2011 DATE OF DISCHARGE:                             HISTORY & PHYSICAL   PRIMARY CARDIOLOGIST:  The patient was scheduled to see Dr. Clifton James on August 20, 2011.  PRIMARY MEDICAL DOCTOR:  Rosalyn Gess. Norins, MD  CHIEF COMPLAINTS:  Chest pain.  HISTORY OF PRESENT ILLNESS:  Mr. 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  He developed diaphoresis, dizziness, some confusion, shortness of breath, and extreme fatigue during that episode.  Nuclear study showed significant ST abnormalities consistent with ischemia with mild inferior ischemia noted on the short axis images.  Today, the patient had a general sense that something was wrong from when he woke up.  He felt diaphoretic with chest pressure and shortness of breath. These symptoms are worse with exertion.  He also notes intermittent indigestion and a great increase in exhaustion recently.  He went to his PCP and was referred to the ER.  EKG shows nonspecific ST-T changes and troponin is negative with point-of-care.  The patient is pain free.  PAST MEDICAL HISTORY:  Abnormal nuclear stress on August 11, 2011, as above with normal EF of 60%-6%.  PAST SURGICAL HISTORY:  Knee surgery.  MEDICATIONS: 1. Aspirin 325 mg daily. 2. Ibuprofen p.r.n.  ALLERGIES:  No known drug allergies.  SOCIAL HISTORY:  Mr. Esco is the climax.  He is married with 3 children.  He works at [employer], but denies any history of smoking cigarettes.  He does not drink any  alcohol.  FAMILY HISTORY:  Mother is living at 51, does not necessarily have coronary artery disease that he knows of, but thinks that there may be some on her side.  Father died at 9 of an MI.  He has 4 sisters in relatively good health.  REVIEW OF SYSTEMS:  Positive for sweats.  Negative for fevers, chills, nausea, vomiting, diarrhea, bright red blood per rectum, melena, or hematemesis.  No bleeding problems in the past or stroke.  All other systems reviewed and otherwise negative.  LABORATORY DATA:  WBC 6.2, hemoglobin 15.6, hematocrit 42.2, platelet count 185.  Sodium 138, potassium 2.8, chloride 104, CO2 of 24, glucose 116, BUN 14, creatinine 0.94.  Troponin is negative with point-of-care. LFTs are normal.  EKG shows normal sinus rhythm with hyperacute T-waves in V2 through V4.  RADIOLOGIC STUDIES:  Chest x-ray showed no active disease.  PHYSICAL EXAMINATION:  VITAL SIGNS:  Temperature 97.5, pulse 73, respirations 15, blood pressure 140/83, pulse ox 100% on room air. GENERAL:  This is a pleasant white male in no acute distress. HEENT:  Normocephalic, atraumatic with extraocular movements intact. Clear sclerae.  Nares are without discharge. NECK:  Supple without carotid bruit. HEART:  Auscultation to the heart reveals regular rate and  rhythm with S1 and S2 without murmurs, rubs, or gallops. LUNGS:  Clear to auscultation bilaterally without wheezes, rales, or rhonchi. ABDOMEN:  Soft, nontender, nondistended.  Positive bowel sounds. EXTREMITIES:  Warm, dry, and without edema. NEUROLOGIC:  He is alert and oriented x3 and responds TO questions appropriately with a normal affect.  ASSESSMENT AND PLAN:  . The patient was seen and examined by Dr. Gala Romney and myself.  This is a 60 year old gentleman with no formal diagnosis of coronary artery disease, but an abnormal nuclear stress test several days ago, who presents with typical unstable angina symptoms that are worse  with exertion, improved with rest, and very concerning for tight coronary blockage.  At this time, we recommend to proceed with cardiac catheterization this afternoon.  We will also proceed to treat him with aspirin, beta-blockade, heparin, and initiate statin.  Other routine lab work including A1c and fasting lipid panels will be checked.  Plan was discussed with the patient including risks, benefits, and alternatives and the patient is agreeable to proceeding.     Dayna Dunn, P.A.C.   ______________________________ Bevelyn Buckles. Jaylise Peek, MD    DD/MEDQ  D:  08/15/2011  T:  08/15/2011  Job:  [id number]  cc:   Verne Carrow, MD Rosalyn Gess. Norins, MD  Electronically Signed by Ronie Spies  on 08/21/2011 07:08:14 PM Electronically Signed by Arvilla Meres MD on 09/07/2011 02:44:32 PM

## 2011-08-21 NOTE — Telephone Encounter (Signed)
Pt was seen by cardiology on 9/26 and I scheduled f/u with Dr Debby Bud on 10/12 at 1.

## 2011-08-27 ENCOUNTER — Telehealth (INDEPENDENT_AMBULATORY_CARE_PROVIDER_SITE_OTHER): Payer: Self-pay

## 2011-08-27 NOTE — Telephone Encounter (Signed)
Called pt to notify him that I did ask Dr Michaell Cowing about getting his pain medicine the day before his surgery  And per Dr Michaell Cowing he does not prescribe pain medicine until the day of surgery./ [facility]

## 2011-09-01 ENCOUNTER — Telehealth (INDEPENDENT_AMBULATORY_CARE_PROVIDER_SITE_OTHER): Payer: Self-pay

## 2011-09-01 NOTE — Telephone Encounter (Signed)
LM with pt's wife to notify pt to stop his aspirin 5 days before his surgery planned with Dr Michaell Cowing.Hulda Humphrey

## 2011-09-05 ENCOUNTER — Ambulatory Visit: Payer: 59 | Admitting: Internal Medicine

## 2011-09-05 DIAGNOSIS — K4090 Unilateral inguinal hernia, without obstruction or gangrene, not specified as recurrent: Secondary | ICD-10-CM

## 2011-09-05 HISTORY — PX: LAPAROSCOPIC INGUINAL HERNIA REPAIR: SUR788

## 2011-09-07 NOTE — Cardiovascular Report (Signed)
  NAME:  ZAKKERY, DORIAN NO.:  [id number]  MEDICAL RECORD NO.:  [id number]  LOCATION:  2807                         FACILITY:  [facility]  PHYSICIAN:  Bevelyn Buckles. Letisia Schwalb, MDDATE OF BIRTH:  1951/11/10  DATE OF PROCEDURE: DATE OF DISCHARGE:                           CARDIAC CATHETERIZATION   PRIMARY CARE PHYSICIAN:  Rosalyn Gess. Norins, MD.  PATIENT IDENTIFICATION:  Bradley Gilbert is a very pleasant 60 year old male who has several-week history of episodes of dyspnea and presyncope.  He had a stress test, which suggested inferior ischemia.  He presented today with worsening dyspnea.  Cardiac markers and EKG were normal. Given his symptoms and positive stress test, he is brought for cardiac catheterization.  PROCEDURES PERFORMED: 1. Selective coronary angiography. 2. Left heart catheterization. 3. Left ventriculogram.  DESCRIPTION OF PROCEDURE:  The risks and indications were explained. Consent was signed, and placed on the chart.  After confirmation of a normal Allen's test,  the right wrist area was prepped and draped in routine sterile fashion, anesthetized with 1% local lidocaine.  A 5- French arterial sheath was placed using a modified Seldinger technique. 4500 units of systemic heparin was given, and 3 mg of intra-arterial verapamil were administered.  Standard catheters including JL-3.5, JR-4, and a straight pigtail were used,  No apparent complications.  Central aortic pressure of 117/71 with a mean of 92.  LV pressure 128 over zero with an EDP of 11.  There is no aortic stenosis.  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  There was separate ostia for the LAD and the left circumflex.  LAD:  A long vessel coursing to the apex.  It gave off two diagonals. There was a 30% lesion in the midportion of the LAD right after the second diagonal.  Left circumflex was a very large dominant vessel, gave off two large OMs.  The large PL and the large PeA was  angiographically normal.  The right coronary artery was a small nondominant vessel with no obstruction.  Left ventriculogram done in the RAO position showed an EF of 60-65% with no regional wall motion abnormalities.  ASSESSMENT: 1. Minimal nonobstructive coronary artery disease as described above. 2. Normal left ventricular function.  DISCUSSION:  Mr. Maselli stress test seems to be a false positive.  We will check a D-dimer and echo.  If all these are normal he can be positively discharged home in the a.m.  If the symptoms persist, we can consider an outpatient chest CT.  We will start him empirically on proton pump inhibitor.     Bevelyn Buckles. Jalaine Riggenbach, MD     DRB/MEDQ  D:  08/15/2011  T:  08/15/2011  Job:  [id number]  cc:   Rosalyn Gess. Norins, MD  Electronically Signed by Arvilla Meres MD on 09/07/2011 02:44:30 PM

## 2011-09-09 ENCOUNTER — Encounter (INDEPENDENT_AMBULATORY_CARE_PROVIDER_SITE_OTHER): Payer: Self-pay

## 2011-09-11 ENCOUNTER — Encounter (INDEPENDENT_AMBULATORY_CARE_PROVIDER_SITE_OTHER): Payer: Self-pay

## 2011-09-23 ENCOUNTER — Ambulatory Visit (INDEPENDENT_AMBULATORY_CARE_PROVIDER_SITE_OTHER): Payer: 59 | Admitting: Surgery

## 2011-09-23 ENCOUNTER — Encounter (INDEPENDENT_AMBULATORY_CARE_PROVIDER_SITE_OTHER): Payer: Self-pay | Admitting: Surgery

## 2011-09-23 VITALS — BP 138/86 | HR 72 | Temp 96.2°F | Resp 20 | Ht 70.0 in | Wt 208.2 lb

## 2011-09-23 DIAGNOSIS — K4090 Unilateral inguinal hernia, without obstruction or gangrene, not specified as recurrent: Secondary | ICD-10-CM

## 2011-09-23 NOTE — Progress Notes (Signed)
Subjective:     Patient ID: Bradley Gilbert, male   DOB: May 28, 1951, 60 y.o.   MRN: [id number]  HPI  Patient Care Team: Duke Salvia, MD as PCP - General (Internal Medicine)  This patient is a 60 y.o.male who presents today for surgical evaluation.   Procedure: Laparoscopic right inguinal repair with mesh 09/05/2011  Patient comes in today feeling sore. He notes it's hard to close his pants at the beltline. He was feeling constipated but thinks he is currently getting on top of that. He had a moderate amount of bruising developed early, but that is nearly resolved. He still rather sensitive. He hesitates to cough. He sneezed and it really bothered him. He's been trying to just take Aleve only. One or 2 doses of hydrocodone a day. He was not taking things together as I had explained to he and his wife. He comes today with his wife. 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  Past Medical History  Diagnosis Date  . Benign neoplasm of colon   . Allergic rhinitis, cause unspecified   . Osteoarthrosis, unspecified whether generalized or localized, unspecified site   . Osteoarthrosis, unspecified whether generalized or localized, lower leg   . Coronary artery disease     Cath 08/15/11 with 30% mid LAD, o/w no CAD  . Abdominal pain     below navel    . Hearing loss     Past Surgical History  Procedure Date  . Knee surgery   . Hand surgery   . Hernia repair 09/05/11    [facility]    History   Social History  . Marital Status: Married    Spouse Name: N/A    Number of Children: N/A  . 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 Smoking status: Never Smoker   . Smokeless tobacco: Never Used  . Alcohol Use: No  . Drug Use: No  . Sexually Active: Not on file   Other Topics Concern  . Not on file   Social History Narrative   Graduate of [employer].  Married - '85.  1 son - '70,  2 daughters - '74, '87;   5 grandchildren.  Hobbies - keeps horses and ponies.  Marriage - good health    Family History  Problem Relation Age of Onset  . Diabetes Mother   . Heart disease Father   . Cancer Father   . COPD Father     throat  . Colon cancer Neg Hx   . Prostate cancer Neg Hx     Current outpatient prescriptions:Acetaminophen (TYLENOL PO), Take by mouth as needed.  , Disp: , Rfl: ;  aspirin 81 MG tablet, Take 81 mg by mouth as needed.  , Disp: , Rfl: ;  naproxen sodium (ANAPROX) 220 MG tablet, Take 220 mg by mouth 2 (two) times daily with a meal.  , Disp: , Rfl: ;  oxyCODONE (OXY IR/ROXICODONE) 5 MG immediate release tablet, every 8 (eight) hours as needed. , Disp: , Rfl:   No Known Allergies     Review of Systems  Constitutional: Negative for fever, chills and diaphoresis.  HENT: Negative for sore throat, trouble swallowing and neck pain.   Eyes: Negative for photophobia and visual disturbance.  Respiratory: Negative for choking and shortness of breath.   Cardiovascular:  Negative for chest pain and palpitations.  Gastrointestinal: Negative for nausea, vomiting, abdominal distention, anal bleeding and rectal pain.  Genitourinary: Negative for dysuria, urgency, difficulty urinating and testicular pain.  Musculoskeletal: Negative for myalgias, arthralgias and gait problem.  Skin: Negative for color change and rash.  Neurological: Negative for dizziness, speech difficulty, weakness and numbness.  Hematological: Negative for adenopathy.  Psychiatric/Behavioral: Negative for hallucinations, confusion and agitation.       Objective:   Physical Exam  Constitutional: He is oriented to person, place, and time. He appears well-developed and well-nourished. No distress.  HENT:  Head: Normocephalic.  Mouth/Throat: Oropharynx is clear and moist. No oropharyngeal exudate.  Eyes: Conjunctivae and EOM are normal. Pupils are equal,  round, and reactive to light. No scleral icterus.  Neck: Normal range of motion. No tracheal deviation present.  Cardiovascular: Normal rate, normal heart sounds and intact distal pulses.   Pulmonary/Chest: Effort normal. No respiratory distress.  Abdominal: Soft. He exhibits no distension. There is no tenderness. Hernia confirmed negative in the right inguinal area and confirmed negative in the left inguinal area.       Incisions clean with normal healing ridges.  No hernias  Genitourinary: Penis normal. No penile tenderness.       R groin sensitive.  No hernia/seroma  Musculoskeletal: Normal range of motion. He exhibits no tenderness.  Neurological: He is alert and oriented to person, place, and time. No cranial nerve deficit. He exhibits normal muscle tone. Coordination normal.  Skin: Skin is warm and dry. No rash noted. He is not diaphoretic.  Psychiatric: He has a normal mood and affect. His behavior is normal.       Assessment:     ~2 weeks from Lap [facility] repair with poorly controlled soreness     Plan:     I noted I think he deserves to get better control than this. Recommended he doubled  Aleve to 2 pills twice a day. Take the oxycodone more regularly. Use ice and/or heat. Experimental final works better for him. Use all 3 categories of pain control together. Hopefully that'll help calm things down.  I agree it is not realistic for him to go back to work right now. Given the very intense activity he does, I think he needs to wait a few more weeks at least. I would like to see him a couple weeks to make sure he is continuing to improve. He and his wife felt reassured.  Increase activity as tolerated.  Do not push through pain.  Advanced on diet as tolerated. Bowel regimen to avoid problems.

## 2011-09-23 NOTE — Patient Instructions (Signed)
Managing Pain  Pain after surgery or related to activity is often due to strain/injury to muscle, tendon, nerves and/or incisions.  This pain is usually short-term and will improve in a few months.   Many people find it helpful to do the following things TOGETHER to help speed the process of healing and to get back to regular activity more quickly:  1. Avoid heavy physical activity a.  no lifting greater than 20 pounds b. Do not "push through" the pain.  Listen to your body and avoid positions and maneuvers than reproduce the pain c. Walking is okay as tolerated, but go slowly and stop when getting sore.  d. Remember: If it hurts to do it, then don't do it! 2. Take Anti-inflammatory medication  a. Take with food/snack around the clock for 1-2 weeks i. This helps the muscle and nerve tissues become less irritable and calm down faster b. Choose ONE of the following over-the-counter medications: i. Naproxen 220mg  tabs (ex. Aleve) 2 pills twice a day  ii. Ibuprofen 200mg  tabs (ex. Advil, Motrin) 3-4 pills with every meal and just before bedtime iii. Acetaminophen 500mg  tabs (Tylenol) 1-2 pills with every meal and just before bedtime 3. Use a Heating pad or Ice/Cold Pack a. 4-6 times a day b. May use warm bath/hottub  or showers 4. Try Gentle Massage and/or Stretching  a. at the area of pain many times a day b. stop if you feel pain - do not overdo it  Try these steps together to help you body heal faster and avoid making things get worse.  Doing just one of these things may not be enough.    If you are not getting better after two weeks or are noticing you are getting worse, contact our office for further advice; we may need to re-evaluate you & see what other things we can do to help.

## 2011-09-25 NOTE — Discharge Summary (Signed)
  NAME:  Bradley Gilbert, Bradley Gilbert NO.:  [id number]  MEDICAL RECORD NO.:  [id number]  LOCATION:  2508                         FACILITY:  [facility]  PHYSICIAN:  Hillis Range, MD       DATE OF BIRTH:  08-Jan-1951  DATE OF ADMISSION:  08/15/2011 DATE OF DISCHARGE:  08/16/2011                              DISCHARGE SUMMARY   PROCEDURES: 1. Cardiac catheterization. 2. Coronary arteriogram. 3. Left ventriculogram. 4. Portable chest x-ray.  PRIMARY FINAL DISCHARGE DIAGNOSIS:  Episodic diaphoresis, dizziness, confusion, shortness of breath, and fatigue.  SECONDARY DIAGNOSES: 1. False positive Myoview with ST abnormalities and mild inferior     ischemia. 2. History of knee surgery. 3. Family history of coronary artery disease in his father.  TIME AT DISCHARGE:  34 minutes.  HOSPITAL COURSE:  Bradley Gilbert is a 60 year old male with no previous history of coronary artery disease.  He had episodic symptoms that were concerning for ischemia.  A stress test was performed which was abnormal as described above.  When he had recurrent symptoms, he was sent to the hospital and admitted for further evaluation and treatment.  His symptoms resolved.  A cardiac catheterization was performed on August 15, 2011, which showed minimal coronary artery disease and a preserved EF.  His only lesion was a 30% stenosis in the LAD.  His EF was 60-65%.  Dr. Gala Romney reviewed the films and recommended a D-dimer as well as an echocardiogram.  The D-dimer was within normal limits. The echocardiogram will be obtained as an outpatient.  On August 16, 2011, Bradley Gilbert's symptoms had resolved.  He was ambulating without chest pain or shortness of breath.  He was evaluated by Dr. Johney Frame and considered stable for discharge, to follow up as an outpatient.  DISCHARGE INSTRUCTIONS:  His activity level is to be increased gradually with no lifting with his right arm for a week and no driving for a day. He  is encouraged to stick to a low-sodium heart-healthy diet.  He is to call our office for problems with the cath site.  He is to follow up at [facility], and we will call him with an appointment.  He is to get an echocardiogram as soon as possible.  He is to follow up with Dr. Debby Bud as needed or as scheduled.  DISCHARGE MEDICATIONS: 1. Aspirin 325 mg daily p.r.n. 2. Ibuprofen 2 tabs q.8 h. p.r.n. 3. Prilosec OTC 20 mg daily. 4. Toprol-XL 25 mg daily.     Bradley Demark, PA-C   ______________________________ Hillis Range, MD    RB/MEDQ  D:  08/16/2011  T:  08/16/2011  Job:  [id number]  Electronically Signed by Bradley Demark PA-C on 09/01/2011 06:45:58 AM Electronically Signed by Hillis Range MD on 09/25/2011 02:26:38 PM

## 2011-10-13 ENCOUNTER — Ambulatory Visit (INDEPENDENT_AMBULATORY_CARE_PROVIDER_SITE_OTHER): Payer: 59 | Admitting: Surgery

## 2011-10-13 ENCOUNTER — Encounter (INDEPENDENT_AMBULATORY_CARE_PROVIDER_SITE_OTHER): Payer: Self-pay | Admitting: Surgery

## 2011-10-13 VITALS — BP 132/88 | HR 72 | Temp 96.9°F | Resp 16 | Ht 71.0 in | Wt 204.4 lb

## 2011-10-13 DIAGNOSIS — R1031 Right lower quadrant pain: Secondary | ICD-10-CM

## 2011-10-13 DIAGNOSIS — K4090 Unilateral inguinal hernia, without obstruction or gangrene, not specified as recurrent: Secondary | ICD-10-CM

## 2011-10-13 MED ORDER — OXYCODONE HCL 5 MG PO TABS: 5.0000 mg | ORAL_TABLET | Freq: Four times a day (QID) | ORAL | Status: AC | PRN

## 2011-10-13 NOTE — Patient Instructions (Signed)
Managing Pain  Pain after surgery or related to activity is often due to strain/injury to muscle, tendon, nerves and/or incisions.  This pain is usually short-term and will improve in a few months.   Many people find it helpful to do the following things TOGETHER to help speed the process of healing and to get back to regular activity more quickly:  1. Avoid heavy physical activity a.  no lifting greater than 20 pounds b. Do not "push through" the pain.  Listen to your body and avoid positions and maneuvers than reproduce the pain c. Walking is okay as tolerated, but go slowly and stop when getting sore.  d. Remember: If it hurts to do it, then don't do it! 2. Take Anti-inflammatory medication  a. Take with food/snack around the clock for 1-2 weeks i. This helps the muscle and nerve tissues become less irritable and calm down faster b. Choose ONE of the following over-the-counter medications: i. Naproxen 220mg  tabs (ex. Aleve) 1-2 pills twice a day  3. Use a Heating pad or Ice/Cold Pack a. 4-6 times a day b. May use warm bath/hottub  or showers 4. Try Gentle Massage and/or Stretching  a. at the area of pain many times a day b. stop if you feel pain - do not overdo it  Try these steps together to help you body heal faster and avoid making things get worse.  Doing just one of these things may not be enough.    If you are not getting better after two weeks or are noticing you are getting worse, contact our office for further advice; we may need to re-evaluate you & see what other things we can do to help.

## 2011-10-13 NOTE — Progress Notes (Signed)
Subjective:     Patient ID: Bradley Gilbert, male   DOB: Jan 30, 1951, 60 y.o.   MRN: [id number]  HPI  Patient Care Team: Duke Salvia, MD as PCP - General (Internal Medicine)  This patient is a 60 y.o.male who presents today for surgical evaluation.   Patient notes his right groin soreness has gone down to intermittent. However, it is not totally relieved. He has stopped taking narcotics. He takes Aleve and Tylenol intermittently. He notes he occasionally gets some pulls and pains.   He did some yard work and noticed some discomfort so backed off.   He wants to get back to work but is worried that it was not safe. Feel some pressure in his right groin with urination but otherwise no difficulty. Distally improved since the last visit. He does not like taking medication, but wonders if he needs is a little more narcotics just in case.  Past Medical History  Diagnosis Date  . Benign neoplasm of colon   . Allergic rhinitis, cause unspecified   . Osteoarthrosis, unspecified whether generalized or localized, unspecified site   . Osteoarthrosis, unspecified whether generalized or localized, lower leg   . Coronary artery disease     Cath 08/15/11 with 30% mid LAD, o/w no CAD  . Abdominal pain     below navel    . Hearing loss   . Inguinal hernia     right    Past Surgical History  Procedure Date  . Knee surgery   . Hand surgery   . Cardiac catheterization 2012  . Hernia repair 09/05/11    lap RIH w mesh    History   Social History  . Marital Status: Married    Spouse Name: N/A    Number of Children: N/A  . 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 Smoking status: Never Smoker   . Smokeless tobacco: Never Used  . Alcohol Use: No  . Drug Use: No  . Sexually Active: Not on file   Other Topics Concern  . Not on file   Social History Narrative   Graduate of [employer].  Married -  '85.  1 son - '70,  2 daughters - '74, '87;   5 grandchildren.  Hobbies - keeps horses and ponies.  Marriage - good health    Family History  Problem Relation Age of Onset  . Diabetes Mother   . Heart disease Father   . Cancer Father     throat  . COPD Father     throat  . Colon cancer Neg Hx   . Prostate cancer Neg Hx     Current outpatient prescriptions:Acetaminophen (TYLENOL PO), Take by mouth as needed.  , Disp: , Rfl: ;  aspirin 81 MG tablet, Take 81 mg by mouth as needed.  , Disp: , Rfl: ;  naproxen sodium (ANAPROX) 220 MG tablet, Take 220 mg by mouth 2 (two) times daily with a meal.  , Disp: , Rfl:  oxyCODONE (OXY IR/ROXICODONE) 5 MG immediate release tablet, Take 1-2 tablets (5-10 mg total) by mouth every 6 (six) hours as needed for pain., Disp: 40 tablet, Rfl: 0  No Known Allergies     Review of Systems  Constitutional: Negative for fever, chills and diaphoresis.  HENT: Negative for sore throat, trouble swallowing and neck pain.   Eyes: Negative for photophobia and visual disturbance.  Respiratory: Negative for  choking and shortness of breath.   Cardiovascular: Negative for chest pain and palpitations.  Gastrointestinal: Negative for nausea, vomiting, diarrhea, constipation, abdominal distention, anal bleeding and rectal pain.  Genitourinary: Negative for dysuria, urgency, discharge, penile swelling, scrotal swelling, difficulty urinating, penile pain and testicular pain.  Musculoskeletal: Negative for myalgias, arthralgias and gait problem.  Skin: Negative for color change and rash.  Neurological: Negative for dizziness, speech difficulty, weakness and numbness.  Hematological: Negative for adenopathy.  Psychiatric/Behavioral: Negative for hallucinations, confusion and agitation.       Objective:   Physical Exam  Constitutional: He is oriented to person, place, and time. He appears well-developed and well-nourished. No distress.  HENT:  Head: Normocephalic.    Mouth/Throat: Oropharynx is clear and moist. No oropharyngeal exudate.  Eyes: Conjunctivae and EOM are normal. Pupils are equal, round, and reactive to light. No scleral icterus.  Neck: Normal range of motion. No tracheal deviation present.  Cardiovascular: Normal rate, normal heart sounds and intact distal pulses.   Pulmonary/Chest: Effort normal. No respiratory distress.  Abdominal: Soft. He exhibits no distension. There is no tenderness. Hernia confirmed negative in the right inguinal area and confirmed negative in the left inguinal area.       Incisions clean with normal healing ridges.  No hernias  Genitourinary: Penis normal. No penile tenderness.       Mild right groin soreness & fullness of spermatic cord.  No hematoma/seroma  Musculoskeletal: Normal range of motion. He exhibits no tenderness.  Neurological: He is alert and oriented to person, place, and time. No cranial nerve deficit. He exhibits normal muscle tone. Coordination normal.  Skin: Skin is warm and dry. No rash noted. He is not diaphoretic.  Psychiatric: He has a normal mood and affect. His behavior is normal.       Assessment:     One month s/p lap RIH repair, slowly recovering    Plan:     Increase activity as tolerated.  Do not push through pain.  Increase Aleve to 2 BID.  Refill oxycodone #40  Advanced on diet as tolerated. Bowel regimen to avoid problems.  RTW 2 weeks - too soon for now given heavy lifting  Return to clinic p.r.n. Call if not better or worse.  The patient & his wife expressed understanding and appreciation

## 2011-10-22 ENCOUNTER — Encounter (INDEPENDENT_AMBULATORY_CARE_PROVIDER_SITE_OTHER): Payer: Self-pay

## 2011-11-20 ENCOUNTER — Other Ambulatory Visit: Payer: 59 | Admitting: *Deleted

## 2011-12-29 ENCOUNTER — Telehealth: Payer: Self-pay

## 2011-12-29 ENCOUNTER — Telehealth: Payer: Self-pay | Admitting: Internal Medicine

## 2011-12-29 MED ORDER — PROMETHAZINE HCL 25 MG PO TABS: 25.0000 mg | ORAL_TABLET | Freq: Three times a day (TID) | ORAL | Status: AC | PRN

## 2011-12-29 MED ORDER — DIPHENOXYLATE-ATROPINE 2.5-0.025 MG PO TABS: 1.0000 | ORAL_TABLET | Freq: Four times a day (QID) | ORAL | Status: AC | PRN

## 2011-12-29 NOTE — Telephone Encounter (Signed)
[facility] Triage Call Report Triage Record Num: [id number] Operator: Audelia Hives Patient Name: Bradley Gilbert Call Date & Time: 12/28/2011 6:33:18PM Patient Phone: [phone] PCP: Illene Regulus Patient Gender: Male PCP Fax : [phone] Patient DOB: [birth year] [facility] Name: [facility] Reason for Call: Caller: Lucienne Minks; PCP: Illene Regulus; CB#: [phone]; Call Reason: Flu, Vomiting; Sx Onset: 12/27/2011; Sx Notes: ; Temp: Tym at ; Wt: ; Home treatment(s) tried: ginger peel, ginger ale, sweet tea; Did home treatment help?: No; Guideline Used: ; Disp:; Appt Scheduled?: Spouse calling regarding flu s/s that started 12/28/11 at 0300, body aches, joint pain, chills, temp 100.2 tympanic. Unable to keep gingerale down, last voided 1500 and has voided only voided once. Has been vomiting q 20 min since 0300. Emergent s/s for Flu-Like Symptoms r/o per protocol except for see in ED due to signs of dehydration. Protocol(s) Used: Flu-Like Symptoms Recommended Outcome per Protocol: See ED Immediately Reason for Outcome: Signs of dehydration Care Advice: ~ Another adult should drive. ~ IMMEDIATE ACTION May have clear liquids (such as water, clear fruit juices without pulp, soda, tea or coffee without dairy or non-dairy creamer, clear broth or bouillon, oral hydration solution, or plain gelatin, fruit ices/popsicles, hard candy) but do not eat solid foods before being seen by your provider. ~ 12/28/2011 6:44:57PM Page 1 of 1 [id number]

## 2011-12-29 NOTE — Telephone Encounter (Signed)
Spoke with the wife of the pt, and she is refusing an ov.  She states he is too sick to come in.

## 2011-12-29 NOTE — Telephone Encounter (Signed)
Called patient - he is having frequent loose stools and nausea and occasional emesis. He is not keeping down fluids. He feels better than yesterday. He denies orthostatic symptoms but has had a 3 lb weight loss and does have dry mucus membranes.  He is warned about the dangers of dehydration or persistent unremitting symptoms, in which case he should call back or if after hours go to the ED  Rx. Lomotil after loose stool limit 4/24hr       Promethazine 25mg  tabs q 6

## 2011-12-31 ENCOUNTER — Ambulatory Visit (INDEPENDENT_AMBULATORY_CARE_PROVIDER_SITE_OTHER): Payer: 59 | Admitting: Internal Medicine

## 2011-12-31 ENCOUNTER — Encounter: Payer: Self-pay | Admitting: Internal Medicine

## 2011-12-31 VITALS — BP 130/86 | HR 77 | Temp 97.2°F | Resp 14 | Wt 196.0 lb

## 2011-12-31 DIAGNOSIS — A084 Viral intestinal infection, unspecified: Secondary | ICD-10-CM

## 2011-12-31 DIAGNOSIS — A09 Infectious gastroenteritis and colitis, unspecified: Secondary | ICD-10-CM

## 2011-12-31 NOTE — Patient Instructions (Signed)
Viral gastroenteritis - no sign of bacterial infection. This is a self -limited infection. Plan is to HYDRATE,HYDRATE - 128+ ounces - gatoraide, apple juice, ginger ale. Food - if you can eat Banannas, rice, applesauce, toast - bland food. Call if you cannot keep fluids down, if you get very light headed, if you start to run a high fever.  Viral Gastroenteritis Viral gastroenteritis is also known as stomach flu. This condition affects the stomach and intestinal tract. The illness typically lasts 3 to 8 days. Most people develop an immune response. This eventually gets rid of the virus. While this natural response develops, the virus can make you quite ill.   CAUSES   Diarrhea and vomiting are often caused by a virus. Medicines (antibiotics) that kill germs will not help unless there is also a germ (bacterial) infection. SYMPTOMS   The most common symptom is diarrhea. This can cause severe loss of fluids (dehydration) and body salt (electrolyte) imbalance. TREATMENT   Treatments for this illness are aimed at rehydration. Antidiarrheal medicines are not recommended. They do not decrease diarrhea volume and may be harmful. Usually, home treatment is all that is needed. The most serious cases involve vomiting so severely that you are not able to keep down fluids taken by mouth (orally). In these cases, intravenous (IV) fluids are needed. Vomiting with viral gastroenteritis is common, but it will usually go away with treatment. HOME CARE INSTRUCTIONS   Small amounts of fluids should be taken frequently. Large amounts at one time may not be tolerated. Plain water may be harmful in infants and the elderly. Oral rehydration solutions (ORS) are available at pharmacies and grocery stores. ORS replace water and important electrolytes in proper proportions. Sports drinks are not as effective as ORS and may be harmful due to sugars worsening diarrhea.  As a general guideline for children, replace any new fluid  losses from diarrhea or vomiting with ORS as follows:     If your child weighs 22 pounds or under (10 kg or less), give 60-120 mL (1/4 - 1/2 cup or 2 - 4 ounces) of ORS for each diarrheal stool or vomiting episode.     If your child weighs more than 22 pounds (more than 10 kgs), give 120-240 mL (1/2 - 1 cup or 4 - 8 ounces) of ORS for each diarrheal stool or vomiting episode.     In a child with vomiting, it may be helpful to give the above ORS replacement in 5 mL (1 teaspoon) amounts every 5 minutes, then increase as tolerated.     While correcting for dehydration, children should eat normally. However, foods high in sugar should be avoided because this may worsen diarrhea. Large amounts of carbonated soft drinks, juice, gelatin desserts, and other highly sugared drinks should be avoided.     After correction of dehydration, other liquids that are appealing to the child may be added. Children should drink small amounts of fluids frequently and fluids should be increased as tolerated.     Adults should eat normally while drinking more fluids than usual. Drink small amounts of fluids frequently and increase as tolerated. Drink enough water and fluids to keep your urine clear or pale yellow. Broths, weak decaffeinated tea, lemon-lime soft drinks (allowed to go flat), and ORS replace fluids and electrolytes.     Avoid:    Carbonated drinks.     Juice.    Extremely hot or cold fluids.     Caffeine drinks.  Fatty, greasy foods.     Alcohol.    Tobacco.    Too much intake of anything at one time.     Gelatin desserts.     Probiotics are active cultures of beneficial bacteria. They may lessen the amount and number of diarrheal stools in adults. Probiotics can be found in yogurt with active cultures and in supplements.     Wash your hands well to avoid spreading bacteria and viruses.     Antidiarrheal medicines are not recommended for infants and children.     Only take  over-the-counter or prescription medicines for pain, discomfort, or fever as directed by your caregiver. Do not give aspirin to children.     For adults with dehydration, ask your caregiver if you should continue all prescribed and over-the-counter medicines.     If your caregiver has given you a follow-up appointment, it is very important to keep that appointment. Not keeping the appointment could result in a lasting (chronic) or permanent injury and disability. If there is any problem keeping the appointment, you must call to reschedule.  SEEK IMMEDIATE MEDICAL CARE IF:    You are unable to keep fluids down.     There is no urine output in 6 to 8 hours or there is only a small amount of very dark urine.     You develop shortness of breath.     There is blood in the vomit (may look like coffee grounds) or stool.     Belly (abdominal) pain develops, increases, or localizes.     There is persistent vomiting or diarrhea.     You have a fever.     Your baby is older than 3 months with a rectal temperature of 102 F (38.9 C) or higher.     Your baby is 97 months old or younger with a rectal temperature of 100.4 F (38 C) or higher.  MAKE SURE YOU:    Understand these instructions.     Will watch your condition.     Will get help right away if you are not doing well or get worse.  Document Released: 11/10/2005 Document Revised: 07/23/2011 Document Reviewed: 03/24/2007 [facility] Patient Information 2012 [place], [place].

## 2011-12-31 NOTE — Progress Notes (Signed)
  Subjective:    Patient ID: Bradley Gilbert, male    DOB: Aug 10, 1951, 61 y.o.   MRN: [id number]  HPI Bradley Gilbert presents with a 3 day h/o sore throat and then N/V to the point of not keeping anything down. He called 2/4 and was started on phenergan which has helped a lot. Now he is having copious watery diarrhea - he has not seen much improvement with lomotil. He has had up to 20 BMs yesterday and 8-10 so far today, although slowing down a little - he has lost from 196- 189.5. A little bit of fever intermittently. No frank abdominal pain but sore from vomiting. PMH, FamHx and SocHx reviewed for any changes and relevance.   Review of Systems System review is negative for any constitutional, cardiac, pulmonary, GI or neuro symptoms or complaints other than as described in the HPI.     Objective:   Physical Exam Orhtostatics - supine 140/94 74                        Sitting   138/92 76                        Standing 121/90 88 HEENT - No sinus tenderness, throat clear Neck- supple Pulm - normal respirations, lungs clear Cor- RRR Abd- BS hyperactive, no guarding or rebound, no HSM Neuro - A&O x 3, normal strength, normal gait.       Assessment & Plan:  Viral gastroenteritis - no acute abdomen, mildly orthostatic and able to take down fluids, no fever, not septic appearing  Plan - Lomotil after loose stools up to 6 per 24 hrs           Phenergan 25 mg po q 6           Hydrate - minimum of 128 oz per day           Call for refractory symptoms, dizziness or near-syncope, refractory fever or pain, BRAT diet.

## 2012-02-26 ENCOUNTER — Encounter: Payer: Self-pay | Admitting: Internal Medicine

## 2012-02-26 ENCOUNTER — Ambulatory Visit (INDEPENDENT_AMBULATORY_CARE_PROVIDER_SITE_OTHER): Payer: 59 | Admitting: Internal Medicine

## 2012-02-26 VITALS — BP 130/76 | HR 81 | Temp 99.0°F | Resp 16 | Wt 202.5 lb

## 2012-02-26 DIAGNOSIS — J111 Influenza due to unidentified influenza virus with other respiratory manifestations: Secondary | ICD-10-CM

## 2012-02-26 MED ORDER — OSELTAMIVIR PHOSPHATE 75 MG PO CAPS: 75.0000 mg | ORAL_CAPSULE | Freq: Two times a day (BID) | ORAL | Status: AC

## 2012-02-26 MED ORDER — DOXYCYCLINE HYCLATE 100 MG PO TABS: 100.0000 mg | ORAL_TABLET | Freq: Two times a day (BID) | ORAL | Status: AC

## 2012-02-26 NOTE — Patient Instructions (Signed)
The symptoms are most consistent with influenza but there may also be bacterial upper respiratory infection.  Plan - Tamiflu 75 mg twice a day for 5 days            Doxycycline 100 mg twice a day for 10 days           Tylenol 1000 mg three times a day on schedule until better            Ibuprofen 600 mg ( 3x200 mg) three times a day on schedule            Hydrate            Eating is optional            Call for shortness of breath, inability to keep down meds or fluids.    Influenza, Adult Influenza ("the flu") is a viral infection of the respiratory tract. It causes chills, fever, cough, headache, body aches, and sore throat. Influenza in general will make you feel sicker than when you have a common cold. Symptoms of the illness typically last a few days. Cough and fatigue may continue for as long as 7 to 10 days. Influenza is highly contagious. It spreads easily to others in the droplets from coughs and sneezes. People frequently become infected by touching something that was recently contaminated with the virus and then touch their mouth, nose or eyes. This infection is caused by a virus. Symptoms will not be reduced or improved by taking an antibiotic. Antibiotics are medications that kill bacteria, not viruses. DIAGNOSIS   Diagnosis of influenza is often made based on the history and physical examination as well as the presence of influenza reports occurring in your community. Testing can be done if the diagnosis is not certain. TREATMENT   Since influenza is caused by a virus, antibiotics are not helpful. Your caregiver may prescribe antiviral medicines to shorten the illness and lessen the severity. Your caregiver may also recommend influenza vaccination and/or antiviral medicines for your family members in order to prevent the spread of influenza to them. HOME CARE INSTRUCTIONS  DO NOT GIVE ASPIRIN TO PERSONS WITH INFLUENZA WHO ARE UNDER 40 YEARS OF AGE. This could lead to brain and liver  damage (Reye's syndrome). Read the label on over-the-counter medicines.   Stay home from work or school if at all possible until most of your symptoms are gone.   Only take over-the-counter or prescription medicines for pain, discomfort, or fever as directed by your caregiver.   Use a cool mist humidifier to increase air moisture. This will make breathing easier.   Rest until your temperature is nearly normal: 98.6 F (37 C). This usually takes 3 to 4 days. Be sure you get plenty of rest.   Drink at least eight, eight-ounce glasses of fluids per day. Fluids include water, juice, broth, gelatin, or lemonade.   Cover your mouth and nose when coughing or sneezing and wash your hands often to prevent the spread of this virus to other persons.  PREVENTION   Annual influenza vaccination (flu shots) is the best way to avoid getting influenza. An annual flu shot is now routinely recommended for all adults in the [place] SEEK MEDICAL CARE IF:    You develop shortness of breath while resting.   You have a deep cough with production of mucous or chest pain.   You develop nausea (feeling sick to your stomach), vomiting, or diarrhea.  SEEK IMMEDIATE MEDICAL CARE IF:  You have difficulty breathing, become short of breath, or your skin or nails turn bluish.   You develop severe neck pain or stiffness.   You develop a severe headache, facial pain, or earache.   You have a fever.   You develop nausea or vomiting that cannot be controlled.  Document Released: 11/07/2000 Document Revised: 10/30/2011 Document Reviewed: 09/12/2009 [facility] Patient Information 2012 [place], [place].

## 2012-02-28 ENCOUNTER — Ambulatory Visit (INDEPENDENT_AMBULATORY_CARE_PROVIDER_SITE_OTHER): Payer: 59 | Admitting: Family Medicine

## 2012-02-28 ENCOUNTER — Encounter: Payer: Self-pay | Admitting: Family Medicine

## 2012-02-28 ENCOUNTER — Telehealth: Payer: Self-pay | Admitting: Internal Medicine

## 2012-02-28 VITALS — BP 110/80 | HR 78 | Temp 97.7°F | Wt 200.0 lb

## 2012-02-28 DIAGNOSIS — R0989 Other specified symptoms and signs involving the circulatory and respiratory systems: Secondary | ICD-10-CM

## 2012-02-28 DIAGNOSIS — R0602 Shortness of breath: Secondary | ICD-10-CM

## 2012-02-28 MED ORDER — ALBUTEROL SULFATE HFA 108 (90 BASE) MCG/ACT IN AERS: 2.0000 | INHALATION_SPRAY | RESPIRATORY_TRACT | Status: DC | PRN

## 2012-02-28 MED ORDER — HYDROCOD POLST-CHLORPHEN POLST 10-8 MG/5ML PO LQCR: 5.0000 mL | Freq: Two times a day (BID) | ORAL | Status: DC

## 2012-02-28 MED ORDER — ALBUTEROL SULFATE (2.5 MG/3ML) 0.083% IN NEBU: 2.5000 mg | INHALATION_SOLUTION | Freq: Four times a day (QID) | RESPIRATORY_TRACT | Status: DC | PRN

## 2012-02-28 NOTE — Patient Instructions (Signed)
Use the cough medicine as needed Use the albuterol inhaler as needed for shortness of breath/wheezing Continue the doxycycline- take w/ food Finish the Tamiflu as directed Call with any questions or concerns If worsening shortness of breath- go to the ER Hang in there!

## 2012-02-28 NOTE — Telephone Encounter (Signed)
Caller: Linda/Patient; PCP: Illene Regulus; CB#: [phone]; Call regarding Cough/Congestion;  seen in office 02/25/12 and placed on Tamiflu and doxycycline.  Cough is worse, and he c/o shortness of breath at times.  Wheezing.  Afebrile.  Cough is productive of yellowish green sputum.  Denies resp distress or other emergent symptoms per protocol; advised appt within 24 hours.  Appt sched 1015 02/28/12 with Dr. Beverely Low at [place] office.

## 2012-02-28 NOTE — Assessment & Plan Note (Signed)
New.  Pt currently being tx'd for flu and possibly bacterial process.  Now w/ wheezing and SOB.  Pt responded to neb tx in office.  Start cough meds, albuterol inhaler prn.  Reviewed supportive care and red flags that should prompt return.  Pt expressed understanding and is in agreement w/ plan.

## 2012-02-28 NOTE — Progress Notes (Signed)
  Subjective:    Patient ID: Bradley Gilbert, male    DOB: 1951/09/12, 61 y.o.   MRN: [id number]  HPI Flu- was seen on Thursday by Dr Debby Bud and dx'd w/ Flu and possible bacterial infxn.  Started on Tamiflu and Doxy.  Reports fevers have improved.  Now having increased SOB, productive cough, 'gurgling' when lying down.  Body aches have improved.   Review of Systems For ROS see HPI     Objective:   Physical Exam  Vitals reviewed. Constitutional: He appears well-developed and well-nourished.  Pulmonary/Chest: Effort normal. No respiratory distress. He has wheezes (scattered expiratory wheezes, clear completely s/p neb tx).          Assessment & Plan:

## 2012-02-29 NOTE — Progress Notes (Signed)
Subjective:    Patient ID: Bradley Gilbert, male    DOB: 07/03/1951, 60 y.o.   MRN: [id number]  HPI Bradley Gilbert presents for a 48 hour h/o severe myalgias/arthralgias and fevers to 102+. He reports being cold all the time. He has not had any nausea or vomiting or GI symptoms. He has not had a productive cough and denies respiratory distress. He has not been around anyone with influenza.  Past Medical History  Diagnosis Date  . Benign neoplasm of colon   . Allergic rhinitis, cause unspecified   . Osteoarthrosis, unspecified whether generalized or localized, unspecified site   . Osteoarthrosis, unspecified whether generalized or localized, lower leg   . Coronary artery disease     Cath 08/15/11 with 30% mid LAD, o/w no CAD  . Abdominal pain     below navel    . Hearing loss   . Inguinal hernia     right   Past Surgical History  Procedure Date  . Knee surgery   . Hand surgery   . Cardiac catheterization 2012  . Hernia repair 09/05/11    lap RIH w mesh   Family History  Problem Relation Age of Onset  . Diabetes Mother   . Heart disease Father   . Cancer Father     throat  . COPD Father     throat  . Colon cancer Neg Hx   . Prostate cancer Neg Hx    History   Social History  . Marital Status: Married    Spouse Name: N/A    Number of Children: N/A  . 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 Smoking status: Never Smoker   . Smokeless tobacco: Never Used  . Alcohol Use: No  . Drug Use: No  . Sexually Active: Not on file   Other Topics Concern  . Not on file   Social History Narrative   Graduate of [employer].  Married - '85.  1 son - '70,  2 daughters - '74, '87;   5 grandchildren.  Hobbies - keeps horses and ponies.  Marriage - good health    Current Outpatient Prescriptions on File Prior to Visit  Medication Sig Dispense Refill  . Acetaminophen (TYLENOL PO) Take by mouth as  needed.        Marland Kitchen aspirin 81 MG tablet Take 81 mg by mouth as needed.        . naproxen sodium (ANAPROX) 220 MG tablet Take 220 mg by mouth 2 (two) times daily with a meal.        . albuterol (PROVENTIL HFA;VENTOLIN HFA) 108 (90 BASE) MCG/ACT inhaler Inhale 2 puffs into the lungs every 4 (four) hours as needed for wheezing or shortness of breath.  1 Inhaler  0   No current facility-administered medications on file prior to visit.      Review of Systems System review is negative for any constitutional, cardiac, pulmonary, GI or neuro symptoms or complaints other than as described in the HPI.     Objective:   Physical Exam Filed Vitals:   02/26/12 1600  BP: 130/76  Pulse: 81  Temp: 99 F (37.2 C)  Resp: 16   Gen'l- WNWD white man in no acute distress but uncomfortable HEENT- TM's normal, throat clear Nodes - no adenopathy Cor - RRR PUlm - good air movement, no increased WOB, no wheezing Abd- BS+, soft Neuro -  A&O x 3, normal cognition, normal gait.        Assessment & Plan:  Influenza like illness - Plan - tamiflu 75mg  bid x 5           Doxycycline 100 mg bid x 10          Supportive care - to call if unable to take meds or keep down fluids.

## 2012-02-29 NOTE — Telephone Encounter (Signed)
Follow up office visit with Dr. Beverely Low reviewed.

## 2012-03-01 ENCOUNTER — Telehealth: Payer: Self-pay

## 2012-03-01 NOTE — Telephone Encounter (Signed)
[facility] Triage Call Report Triage Record Num: [id number] Operator: Chevis Pretty Patient Name: Bradley Gilbert Call Date & Time: 02/28/2012 8:44:49AM Patient Phone: [phone] PCP: Illene Regulus Patient Gender: Male PCP Fax : [phone] Patient DOB: [birth year] [facility] Name: [facility] Reason for Call: Caller: Linda/Patient; PCP: Illene Regulus; CB#: [phone]; Call regarding Cough/Congestion; seen in office 02/25/12 and placed on Tamiflu and doxycycline. Cough is worse, and he c/o shortness of breath at times. Wheezing. Afebrile. Cough is productive of yellowish green sputum. Denies resp distress or other emergent symptoms per protocol; advised appt within 24 hours. Appt sched 1015 02/28/12 with Dr. Beverely Low at [place] office. Protocol(s) Used: Cough - Adult Recommended Outcome per Protocol: See Provider within 24 hours Reason for Outcome: Productive cough with colored sputum (other than clear or white sputum) Care Advice: ~ Use a cool mist humidifier to moisten air. Be sure to clean according to manufacturer's instructions. Limit or avoid exposure to irritants and allergens (e.g. air pollution, smoke/smoking, chemicals, dust, pollen, pet dander, etc.) ~ Call provider if fever greater than 101.5 F (38.6 C) or 100.5 F (38.1C) in an immunocompromised patient (such as diabetes, HIV/AIDS, renal disease, chemotherapy, organ transplant, or chronic steroid use) has not improved in 24 hours. ~ Increase fluids to 8-12 eight oz (1.6 to 2.4 liters) glasses per day, half of them to be water. Soups, popsicles, fruit juices, non-caffeinated sodas (unless restricting sodium intake), jello, broths, decaf teas, etc. are all okay. Warm fluids can be soothing. ~ ~ If you can, stop smoking now and avoid all secondhand smoke. ~ HEALTH PROMOTION / MAINTENANCE ~ SYMPTOM / CONDITION MANAGEMENT ~ CAUTIONS Coughing up mucus or phlegm helps to get rid of an infection. A productive cough should  not be stopped. A cough medicine with guaifenesin (Robitussin, Mucinex) can help loosen the mucus. Cough medicine with dextromethorphan (DM) should be avoided. Drinking lots of fluids can help loosen the mucus too, especially warm fluids. ~ 02/28/2012 8:52:48AM Page 1 of 1 [id number]

## 2012-07-14 DIAGNOSIS — Z0279 Encounter for issue of other medical certificate: Secondary | ICD-10-CM

## 2012-07-17 ENCOUNTER — Ambulatory Visit (INDEPENDENT_AMBULATORY_CARE_PROVIDER_SITE_OTHER): Payer: 59 | Admitting: Family Medicine

## 2012-07-17 ENCOUNTER — Encounter: Payer: Self-pay | Admitting: Family Medicine

## 2012-07-17 VITALS — BP 122/80 | HR 67 | Temp 98.0°F | Ht 70.0 in | Wt 209.0 lb

## 2012-07-17 DIAGNOSIS — B882 Other arthropod infestations: Secondary | ICD-10-CM | POA: Insufficient documentation

## 2012-07-17 DIAGNOSIS — A94 Unspecified arthropod-borne viral fever: Secondary | ICD-10-CM

## 2012-07-17 MED ORDER — DOXYCYCLINE HYCLATE 100 MG PO CAPS: 100.0000 mg | ORAL_CAPSULE | Freq: Two times a day (BID) | ORAL | Status: AC

## 2012-07-17 NOTE — Progress Notes (Signed)
OFFICE NOTE  07/17/2012  CC:  Chief Complaint  Patient presents with  . Tick Removal    tick bite 2 weeks ago, in belly button     HPI: Patient is a 61 y.o. Caucasian male who is here for malaise. He describes about 3 days of HA, neck stiffness, fatigue, slight dizziness and nausea from time to time, slight blurry vision. Appetite is down.  Taking tylenol and advil regularly lately.  No known fevers.  He has a bit of rash on chest that has been there for weeks, mildly itchy at the most.  Mild ST. No nasal congestion, no cough, no n/v/d.  He got a tick from umbillical area about 2 wks ago.  Pertinent PMH:  Past Medical History  Diagnosis Date  . Benign neoplasm of colon   . Allergic rhinitis, cause unspecified   . Osteoarthrosis, unspecified whether generalized or localized, unspecified site   . Osteoarthrosis, unspecified whether generalized or localized, lower leg   . Coronary artery disease     Cath 08/15/11 with 30% mid LAD, o/w no CAD  . Abdominal pain     below navel    . Hearing loss   . Inguinal hernia     right    MEDS:  Tylenol q6h prn  PE: Blood pressure 122/80, pulse 67, temperature 98 F (36.7 C), temperature source Oral, height 5\' 10"  (1.778 m), weight 209 lb (94.802 kg), SpO2 95.00%. Gen: Alert, well appearing.  Patient is oriented to person, place, time, and situation. ENT: Ears: EACs clear, normal epithelium.  TMs with good light reflex and landmarks bilaterally.  Eyes: no injection, icteris, swelling, or exudate.  EOMI, PERRLA.  FUNDOSCOPY: normal retinal vasculature and optic discs bilat. Nose: no drainage or turbinate edema/swelling.  No injection or focal lesion.  Mouth: lips without lesion/swelling.  Oral mucosa pink and moist.  Dentition intact and without obvious caries or gingival swelling.  Oropharynx without erythema, exudate, or swelling.  Neck - No masses or thyromegaly or limitation in range of motion.  Posterior and lateral neck soft  tissues/muscles are mildly TTP, as are the trapezius areas bilat. CV: RRR, no m/r/g.   LUNGS: CTA bilat, nonlabored resps, good aeration in all lung fields. ABD: soft, NT EXT: no clubbing, cyanosis, or edema.  SKIN: a few pinkish/erythematous papules are present just under the pectoral area on each side, these are blanchable.  He has one petechial-like lesion on the volar surface of each wrist.  Ankles and remainder of skin w/out rash.    IMPRESSION AND PLAN:  Tick borne illness suspected, vs viral syndrome. He looks pretty good in exam room today. Will rx doxycycline 100mg  bid x 10d.  If not significantly improved in 4-5 d then return for recheck, earlier if worsening.  FOLLOW UP: prn

## 2013-06-27 ENCOUNTER — Encounter: Payer: Self-pay | Admitting: Gastroenterology

## 2014-01-23 ENCOUNTER — Emergency Department (HOSPITAL_COMMUNITY): Payer: 59

## 2014-01-23 ENCOUNTER — Encounter (HOSPITAL_COMMUNITY): Payer: Self-pay | Admitting: Emergency Medicine

## 2014-01-23 ENCOUNTER — Emergency Department (HOSPITAL_COMMUNITY)
Admission: EM | Admit: 2014-01-23 | Discharge: 2014-01-24 | Disposition: A | Discharge status: Home / Self Care | Payer: 59 | Attending: Emergency Medicine | Admitting: Emergency Medicine

## 2014-01-23 DIAGNOSIS — J309 Allergic rhinitis, unspecified: Secondary | ICD-10-CM | POA: Insufficient documentation

## 2014-01-23 DIAGNOSIS — Y93H9 Activity, other involving exterior property and land maintenance, building and construction: Secondary | ICD-10-CM | POA: Insufficient documentation

## 2014-01-23 DIAGNOSIS — D126 Benign neoplasm of colon, unspecified: Secondary | ICD-10-CM | POA: Insufficient documentation

## 2014-01-23 DIAGNOSIS — Z23 Encounter for immunization: Secondary | ICD-10-CM | POA: Insufficient documentation

## 2014-01-23 DIAGNOSIS — W19XXXA Unspecified fall, initial encounter: Secondary | ICD-10-CM

## 2014-01-23 DIAGNOSIS — S43016A Anterior dislocation of unspecified humerus, initial encounter: Secondary | ICD-10-CM | POA: Insufficient documentation

## 2014-01-23 DIAGNOSIS — R109 Unspecified abdominal pain: Secondary | ICD-10-CM | POA: Insufficient documentation

## 2014-01-23 DIAGNOSIS — R0682 Tachypnea, not elsewhere classified: Secondary | ICD-10-CM | POA: Insufficient documentation

## 2014-01-23 DIAGNOSIS — Y92009 Unspecified place in unspecified non-institutional (private) residence as the place of occurrence of the external cause: Secondary | ICD-10-CM

## 2014-01-23 DIAGNOSIS — M1710 Unilateral primary osteoarthritis, unspecified knee: Secondary | ICD-10-CM | POA: Insufficient documentation

## 2014-01-23 DIAGNOSIS — I2510 Atherosclerotic heart disease of native coronary artery without angina pectoris: Secondary | ICD-10-CM | POA: Insufficient documentation

## 2014-01-23 DIAGNOSIS — S43005A Unspecified dislocation of left shoulder joint, initial encounter: Secondary | ICD-10-CM

## 2014-01-23 DIAGNOSIS — IMO0002 Reserved for concepts with insufficient information to code with codable children: Secondary | ICD-10-CM | POA: Insufficient documentation

## 2014-01-23 DIAGNOSIS — IMO0001 Reserved for inherently not codable concepts without codable children: Secondary | ICD-10-CM | POA: Insufficient documentation

## 2014-01-23 DIAGNOSIS — H902 Conductive hearing loss, unspecified: Secondary | ICD-10-CM | POA: Insufficient documentation

## 2014-01-23 DIAGNOSIS — M1990 Unspecified osteoarthritis, unspecified site: Secondary | ICD-10-CM | POA: Insufficient documentation

## 2014-01-23 DIAGNOSIS — Z79899 Other long term (current) drug therapy: Secondary | ICD-10-CM | POA: Insufficient documentation

## 2014-01-23 DIAGNOSIS — W1789XA Other fall from one level to another, initial encounter: Secondary | ICD-10-CM | POA: Insufficient documentation

## 2014-01-23 LAB — CBC WITH DIFFERENTIAL/PLATELET
Basophils Absolute: 0 10*3/uL (ref 0.0–0.1)
Basophils Relative: 0 % (ref 0–1)
EOS ABS: 0.1 10*3/uL (ref 0.0–0.7)
EOS PCT: 1 % (ref 0–5)
HEMATOCRIT: 45.3 % (ref 39.0–52.0)
HEMOGLOBIN: 16.5 g/dL (ref 13.0–17.0)
LYMPHS ABS: 1.9 10*3/uL (ref 0.7–4.0)
LYMPHS PCT: 16 % (ref 12–46)
MCH: 32.4 pg (ref 26.0–34.0)
MCHC: 36.4 g/dL — ABNORMAL HIGH (ref 30.0–36.0)
MCV: 88.8 fL (ref 78.0–100.0)
MONO ABS: 0.7 10*3/uL (ref 0.1–1.0)
MONOS PCT: 6 % (ref 3–12)
Neutro Abs: 9.1 10*3/uL — ABNORMAL HIGH (ref 1.7–7.7)
Neutrophils Relative %: 77 % (ref 43–77)
Platelets: 194 10*3/uL (ref 150–400)
RBC: 5.1 MIL/uL (ref 4.22–5.81)
RDW: 12.5 % (ref 11.5–15.5)
WBC: 11.7 10*3/uL — AB (ref 4.0–10.5)

## 2014-01-23 LAB — COMPREHENSIVE METABOLIC PANEL
ALK PHOS: 49 U/L (ref 39–117)
ALT: 23 U/L (ref 0–53)
AST: 22 U/L (ref 0–37)
Albumin: 4.2 g/dL (ref 3.5–5.2)
BUN: 19 mg/dL (ref 6–23)
CALCIUM: 9.9 mg/dL (ref 8.4–10.5)
CO2: 23 meq/L (ref 19–32)
Chloride: 101 mEq/L (ref 96–112)
Creatinine, Ser: 1.08 mg/dL (ref 0.50–1.35)
GFR, EST AFRICAN AMERICAN: 82 mL/min — AB (ref >90)
GFR, EST NON AFRICAN AMERICAN: 71 mL/min — AB (ref >90)
GLUCOSE: 135 mg/dL — AB (ref 70–99)
Potassium: 3.7 mEq/L (ref 3.7–5.3)
SODIUM: 139 meq/L (ref 137–147)
TOTAL PROTEIN: 7.5 g/dL (ref 6.0–8.3)
Total Bilirubin: 0.7 mg/dL (ref 0.3–1.2)

## 2014-01-23 MED ORDER — ETOMIDATE 2 MG/ML IV SOLN
10.0000 mg | Freq: Once | INTRAVENOUS | Status: AC
  Administered 2014-01-23: 10 mg via INTRAVENOUS
  Filled 2014-01-23: qty 10

## 2014-01-23 MED ORDER — FENTANYL CITRATE 0.05 MG/ML IJ SOLN
100.0000 ug | INTRAMUSCULAR | Status: DC | PRN
  Administered 2014-01-23: 100 ug via INTRAVENOUS
  Filled 2014-01-23 (×2): qty 2

## 2014-01-23 MED ORDER — FENTANYL CITRATE 0.05 MG/ML IJ SOLN
INTRAMUSCULAR | Status: AC | PRN
  Administered 2014-01-23 (×2): 100 ug via INTRAVENOUS

## 2014-01-23 MED ORDER — SODIUM CHLORIDE 0.9 % IV BOLUS (SEPSIS)
1000.0000 mL | Freq: Once | INTRAVENOUS | Status: AC
  Administered 2014-01-23: 1000 mL via INTRAVENOUS

## 2014-01-23 MED ORDER — ETOMIDATE 2 MG/ML IV SOLN
INTRAVENOUS | Status: AC
  Filled 2014-01-23: qty 10

## 2014-01-23 MED ORDER — IOHEXOL 300 MG/ML  SOLN
100.0000 mL | Freq: Once | INTRAMUSCULAR | Status: AC | PRN
  Administered 2014-01-23: 100 mL via INTRAVENOUS

## 2014-01-23 MED ORDER — ETOMIDATE 2 MG/ML IV SOLN
INTRAVENOUS | Status: AC | PRN
  Administered 2014-01-23: 5 mg via INTRAVENOUS
  Administered 2014-01-23: 10 mg via INTRAVENOUS

## 2014-01-23 MED ORDER — FENTANYL CITRATE 0.05 MG/ML IJ SOLN
50.0000 ug | Freq: Once | INTRAMUSCULAR | Status: AC
  Administered 2014-01-23: 50 ug via INTRAVENOUS
  Filled 2014-01-23: qty 2

## 2014-01-23 MED ORDER — FENTANYL CITRATE 0.05 MG/ML IJ SOLN
INTRAMUSCULAR | Status: AC | PRN
  Administered 2014-01-23: 50 ug via INTRAVENOUS

## 2014-01-23 MED ORDER — HYDROMORPHONE HCL PF 1 MG/ML IJ SOLN
1.0000 mg | INTRAMUSCULAR | Status: AC
  Administered 2014-01-23: 1 mg via INTRAVENOUS
  Filled 2014-01-23: qty 1

## 2014-01-23 MED ORDER — ONDANSETRON HCL 4 MG/2ML IJ SOLN
4.0000 mg | Freq: Once | INTRAMUSCULAR | Status: AC
  Administered 2014-01-23: 4 mg via INTRAVENOUS
  Filled 2014-01-23: qty 2

## 2014-01-23 MED ORDER — ETOMIDATE 2 MG/ML IV SOLN
INTRAVENOUS | Status: AC | PRN
  Administered 2014-01-23: 10 mg via INTRAVENOUS
  Administered 2014-01-23: 5 mg via INTRAVENOUS

## 2014-01-23 MED ORDER — ETOMIDATE 2 MG/ML IV SOLN
INTRAVENOUS | Status: AC | PRN
  Administered 2014-01-23: 10 mg via INTRAVENOUS

## 2014-01-23 MED ORDER — TETANUS-DIPHTH-ACELL PERTUSSIS 5-2.5-18.5 LF-MCG/0.5 IM SUSP
0.5000 mL | Freq: Once | INTRAMUSCULAR | Status: AC
  Administered 2014-01-23: 0.5 mL via INTRAMUSCULAR
  Filled 2014-01-23: qty 0.5

## 2014-01-23 MED ORDER — FENTANYL CITRATE 0.05 MG/ML IJ SOLN
INTRAMUSCULAR | Status: AC
  Filled 2014-01-23: qty 2

## 2014-01-23 MED ORDER — SODIUM CHLORIDE 0.9 % IV SOLN: Freq: Once | INTRAVENOUS | Status: DC

## 2014-01-23 MED ORDER — FENTANYL CITRATE 0.05 MG/ML IJ SOLN
INTRAMUSCULAR | Status: AC | PRN
Start: 2014-01-23 — End: 2014-01-23
  Administered 2014-01-23 (×2): 50 ug via INTRAVENOUS

## 2014-01-23 MED ORDER — ETOMIDATE 2 MG/ML IV SOLN: 20.0000 mg | Freq: Once | INTRAVENOUS | Status: DC

## 2014-01-23 MED ORDER — FENTANYL CITRATE 0.05 MG/ML IJ SOLN
50.0000 ug | Freq: Once | INTRAMUSCULAR | Status: DC
  Filled 2014-01-23: qty 2

## 2014-01-23 NOTE — Sedation Documentation (Signed)
Pt is able to state full Name, DOB, and where he is right now.

## 2014-01-23 NOTE — ED Notes (Signed)
PA at bedside.

## 2014-01-23 NOTE — ED Provider Notes (Signed)
Medical screening examination/treatment/procedure(s) were conducted as a shared visit with non-physician practitioner(s) and myself.  I personally evaluated the patient during the encounter.    I saw and evaluated the patient, reviewed the resident's note and I agree with the findings and plan.   63yo M, c/o left shoulder pain s/p fall from roof today while trying to repair a gutter PTA. Has been associated with left shoulder decreased ROM as well as "tingling" in his left fingers. States he landed on his left side. Endorses brief LOC, but awoke A&O and ambulated into his house. Denies prodromal symptoms before fall, no AMS/confusion, no neck or back pain, no head pain, no CP/SOB, no abd pain, no N/V/D, no focal motor weakness. VSS, A&O, CTA, RRR, abd soft/NT, +TTP left shoulder with deformity. CT H/CS/C/A/P negative for acute injury. XR left shoulder with anterior dislocation. Attempted reduction x2 with adequate sedation and muscle relaxation. Shoulder will reduce then dislocate again during attempts. NMS continues intact left hand, strong radial pulse. Pt has been seen at [facility] previously (Dr. Shellia Carwin). T/C to Ortho Dr. Alvan Dame, case discussed, including:  HPI, pertinent PM/SHx, VS/PE, dx testing, ED course and treatment: requests he will come to ED for evaluation. Ortho Dr. Alvan Dame has come to the ED for evaluation. 3rd reduction attempted by Dr. Alvan Dame with procedural sedation. Post-XR obtained: shoulder is reduced. Sling is in place. NMS left hand remains intact with strong radial pulse. Pt states he "feels better," has gotten himself dressed and is sitting in a chair in his room requesting to be discharged. Pt VSS, A&O, resps easy. Ortho Dr. Alvan Dame requests EDP to d/c pt to home with rx ultram prn pain and f/u in ofc in the next few weeks. Dr. Alvan Dame has spoken extensively with pt and his wife. Dx and testing d/w pt and family.  Questions answered.  Verb understanding, agreeable to d/c home with  outpt f/u.   Preprocedure Pre-anesthesia/induction confirmation of laterality/correct procedure site including "time-out."  Provider confirms review of the nurses' note, allergies, medications, pertinent labs, PMH, pre-induction vital signs, pulse oximetry, pain level, and ECG (as applicable), and patient condition satisfactory for commencing with order for sedation and procedure. This procedure (procedural sedation and shoulder dislocation/reduction) has been fully reviewed with the patient and written informed consent has been obtained.  Procedural sedation: (x3) Presedation checklist: Informed consent, including risks and benefits, obtained. Pre-procedural timeout performed with Pre-sedation confirmation of laterality/correct procedure site.  Provider confirms review of the nurses' note, allergies, medications, pertinent labs, PMH, pre-induction vital signs, pulse oximetry, pain level, and patient condition satisfactory for commencing with order for sedation/procedure.  Awake and alert, Patient on monitor, Continuous pulse oximetry, Supplemental oxygen provided, NPO status verified, Suction available, Sedation reversal agent(s) at bedside as applicable; Resuscitative equipment readily available. Last meal: 12 hours ago;  Physician time: 15 minutes; Agent: Etomidate, Fentanyl; Level: Moderate;  Complications: None; Treatment: None;  Reassessment: Awake, Alert, Oriented, Vital signs normal, Returned to presedation baseline, Procedure completed.  Orthopedic Procedure: (x2) Timeout: Pre-procedural timeout;  Indication: Dislocation; Joint: Left shoulder; Description: Closed;  Procedure: Informed consent obtained,  Procedural sedation (see note),  Closed reduction of dislocation, By traction, scapular rotation,  Immobilization: By myself, Assisted by Emergency Medicine Resident, Shoulder immobilzer; Post-reduction xray obtained; Reassessment:  Neurovascularly intact, continues dislocated.     Results for orders placed during the hospital encounter of 01/23/14  CBC WITH DIFFERENTIAL      Result Value Ref Range   WBC 11.7 (*) 4.0 -  10.5 K/uL   RBC 5.10  4.22 - 5.81 MIL/uL   Hemoglobin 16.5  13.0 - 17.0 g/dL   HCT 45.3  39.0 - 52.0 %   MCV 88.8  78.0 - 100.0 fL   MCH 32.4  26.0 - 34.0 pg   MCHC 36.4 (*) 30.0 - 36.0 g/dL   RDW 12.5  11.5 - 15.5 %   Platelets 194  150 - 400 K/uL   Neutrophils Relative % 77  43 - 77 %   Neutro Abs 9.1 (*) 1.7 - 7.7 K/uL   Lymphocytes Relative 16  12 - 46 %   Lymphs Abs 1.9  0.7 - 4.0 K/uL   Monocytes Relative 6  3 - 12 %   Monocytes Absolute 0.7  0.1 - 1.0 K/uL   Eosinophils Relative 1  0 - 5 %   Eosinophils Absolute 0.1  0.0 - 0.7 K/uL   Basophils Relative 0  0 - 1 %   Basophils Absolute 0.0  0.0 - 0.1 K/uL  COMPREHENSIVE METABOLIC PANEL      Result Value Ref Range   Sodium 139  137 - 147 mEq/L   Potassium 3.7  3.7 - 5.3 mEq/L   Chloride 101  96 - 112 mEq/L   CO2 23  19 - 32 mEq/L   Glucose, Bld 135 (*) 70 - 99 mg/dL   BUN 19  6 - 23 mg/dL   Creatinine, Ser 1.08  0.50 - 1.35 mg/dL   Calcium 9.9  8.4 - 10.5 mg/dL   Total Protein 7.5  6.0 - 8.3 g/dL   Albumin 4.2  3.5 - 5.2 g/dL   AST 22  0 - 37 U/L   ALT 23  0 - 53 U/L   Alkaline Phosphatase 49  39 - 117 U/L   Total Bilirubin 0.7  0.3 - 1.2 mg/dL   GFR calc non Af Amer 71 (*) >90 mL/min   GFR calc Af Amer 82 (*) >90 mL/min   Dg Elbow Complete Left 01/23/2014   CLINICAL DATA:  Trauma. [place] off house. Patient declined further imaging of the elbow.  EXAM: LEFT ELBOW - COMPLETE 3+ VIEW  COMPARISON:  None.  FINDINGS: Three views the left elbow are limited by positioning. No gross fracture is evident, but assessment for joint effusion is not possible. Subtle or nondisplaced fractures could be obscured.  IMPRESSION: No gross fracture evident although study is limited.   Electronically Signed   By: Misty Stanley M.D.   On: 01/23/2014 19:43   Dg Wrist Complete Left 01/23/2014   CLINICAL  DATA:  Fall from roof.  Wrist pain.  EXAM: LEFT WRIST - COMPLETE 3+ VIEW  COMPARISON:  None.  FINDINGS: No definite acute fracture or dislocation is identified. There is radiocarpal joint space loss with mild scapholunate diastasis. On the lateral view, there is a well corticated ossific density anterior to the ulnar styloid which may be related to an old carpal bone fracture.  IMPRESSION: No definite acute osseous findings. Possible old carpal bone fracture with mild scapholunate diastasis, suggesting scapholunate ligament injury.   Electronically Signed   By: Camie Patience M.D.   On: 01/23/2014 19:38   Ct Head Wo Contrast 01/23/2014   CLINICAL DATA:  Status post fall from as far as 84 phi  EXAM: CT HEAD WITHOUT CONTRAST  CT CERVICAL SPINE WITHOUT CONTRAST  TECHNIQUE: Multidetector CT imaging of the head and cervical spine was performed following the standard protocol without intravenous contrast. Multiplanar CT  image reconstructions of the cervical spine were also generated.  COMPARISON:  None.  FINDINGS: CT HEAD FINDINGS  The ventricles are normal in size and position. There is no intracranial hemorrhage nor intracranial mass effect. There is no evidence of an evolving ischemic infarction. There are no abnormal intracranial calcifications. The cerebellum and brainstem are normal in appearance.  At bone window settings the observed portions of the paranasal sinuses and mastoid air cells are clear. There is no evidence of an acute skull fracture.  CT CERVICAL SPINE FINDINGS  The cervical vertebral bodies are preserved in height. There is mild loss of the normal cervical lordosis. The intervertebral disc space height is decreased at C5-6. There are anterior endplate osteophytes at C4-5 and C5-6 and C6-7. There is no evidence of a perched facet nor spinous process fracture. The prevertebral soft tissue spaces appear normal. The odontoid is intact and the lateral masses of C1 align normally with those of C2. The bony  ring at each cervical level is intact. The observed portions of the first rib appear normal.  IMPRESSION: 1. There is no acute intracranial abnormality nor evidence of an acute skull fracture. 2. There is mild loss of the normal cervical lordosis likely secondary to muscle spasm. There is no evidence of an acute fracture or dislocation. There is degenerative disc change of the mid and lower cervical spine.   Electronically Signed   By: David  Martinique   On: 01/23/2014 18:47   Ct Chest W Contrast 01/23/2014   CLINICAL DATA:  Fall from roof  EXAM: CT CHEST, ABDOMEN, AND PELVIS WITH CONTRAST  TECHNIQUE: Multidetector CT imaging of the chest, abdomen and pelvis was performed following the standard protocol during bolus administration of intravenous contrast.  CONTRAST:  164mL OMNIPAQUE IOHEXOL 300 MG/ML  SOLN  COMPARISON:  None.  FINDINGS: CT CHEST FINDINGS  There is a left anterior shoulder dislocation with Hill-Sachs fracture along the posterior aspect of the left humeral head.  No evidence of aortic dissection. No mediastinal hematoma. No pericardial fluid.  There is no pneumothorax or pulmonary contusion. Mild basilar atelectasis and bronchiectasis present. No evidence of rib fracture or scapular fracture.  CT ABDOMEN AND PELVIS FINDINGS  No evidence of hepatic trauma. There several low-density lesions in the periphery of the right hepatic alone and superiorly in the left hepatic lobe which likely represents a hemangiomas or cysts but are technically indeterminate. Spleen demonstrates no evidence of trauma. Adrenal glands and kidneys are normal. The pancreas and duodenum appear normal. There is no evidence of injury to the abdominal aorta.  No free fluid in the abdomen or pelvis. No evidence of pelvic fracture or spine fracture. Bladder is intact.  IMPRESSION: 1. Left anterior shoulder dislocation with Hill-Sachs fracture. 2. No evidence of traumatic injury to the aorta. 3. No pneumothorax. 4. No evidence of trauma  in abdomen pelvis. 5. No evidence of pelvic fracture or spine fracture. 6. Several low-density lesions within the liver which cannot be fully characterized likely represent cysts or hemangiomas but cannot be fully characterized due to small size. Findings conveyed toJACOB, LACKEY on 01/23/2014  at19:05.   Electronically Signed   By: Suzy Bouchard M.D.   On: 01/23/2014 19:06   Ct Cervical Spine Wo Contrast 01/23/2014   CLINICAL DATA:  Status post fall from as far as 85 phi  EXAM: CT HEAD WITHOUT CONTRAST  CT CERVICAL SPINE WITHOUT CONTRAST  TECHNIQUE: Multidetector CT imaging of the head and cervical spine was performed following the  standard protocol without intravenous contrast. Multiplanar CT image reconstructions of the cervical spine were also generated.  COMPARISON:  None.  FINDINGS: CT HEAD FINDINGS  The ventricles are normal in size and position. There is no intracranial hemorrhage nor intracranial mass effect. There is no evidence of an evolving ischemic infarction. There are no abnormal intracranial calcifications. The cerebellum and brainstem are normal in appearance.  At bone window settings the observed portions of the paranasal sinuses and mastoid air cells are clear. There is no evidence of an acute skull fracture.  CT CERVICAL SPINE FINDINGS  The cervical vertebral bodies are preserved in height. There is mild loss of the normal cervical lordosis. The intervertebral disc space height is decreased at C5-6. There are anterior endplate osteophytes at C4-5 and C5-6 and C6-7. There is no evidence of a perched facet nor spinous process fracture. The prevertebral soft tissue spaces appear normal. The odontoid is intact and the lateral masses of C1 align normally with those of C2. The bony ring at each cervical level is intact. The observed portions of the first rib appear normal.  IMPRESSION: 1. There is no acute intracranial abnormality nor evidence of an acute skull fracture. 2. There is mild loss of the  normal cervical lordosis likely secondary to muscle spasm. There is no evidence of an acute fracture or dislocation. There is degenerative disc change of the mid and lower cervical spine.   Electronically Signed   By: David  Martinique   On: 01/23/2014 18:47   Dg Shoulder Left 01/23/2014   CLINICAL DATA:  Fall from roof  EXAM: LEFT SHOULDER - 2+ VIEW  COMPARISON:  DG ELBOW COMPLETE*L* dated 01/23/2014  FINDINGS: There is anterior left shoulder dislocation with the humeral head anterior medial to the glenoid fossa. Hill-Sachs deformity in the posterior humeral head.  IMPRESSION: Anterior left shoulder dislocation with Hill-Sachs deformity.   Electronically Signed   By: Suzy Bouchard M.D.   On: 01/23/2014 19:44   Dg Shoulder Left Port 01/23/2014   CLINICAL DATA:  Post attempted reduction.  EXAM: PORTABLE LEFT SHOULDER - 2+ VIEW  COMPARISON:  01/23/2014.  FINDINGS: Left humeral head remains dislocated. On the prior oblique view, this was noted to be located anteriorly. Impaction against the anterior glenoid with Hill-Sachs deformity.  Pulmonary vascular prominence.  Acromioclavicular joint degenerative changes.  IMPRESSION: Left humeral head remains dislocated. On the prior oblique view, this was noted to be located anteriorly. Impaction against the anterior glenoid with Hill-Sachs deformity.   Electronically Signed   By: Chauncey Cruel M.D.   On: 01/23/2014 23:01   Dg Shoulder Left Port 01/23/2014   CLINICAL DATA:  Postreduction.  EXAM: PORTABLE LEFT SHOULDER - 2+ VIEW  COMPARISON:  Earlier same date.  FINDINGS: 2101 hrs. There is persistent anterior inferior glenohumeral dislocation. Hill-Sachs deformity of the humeral head is not well visualized on this examination.  IMPRESSION: Persistent dislocation of the left glenohumeral joint.   Electronically Signed   By: Camie Patience M.D.   On: 01/23/2014 21:21   Dg Hand Complete Left 01/23/2014   CLINICAL DATA:  Fall from roof.  Hand pain.  EXAM: LEFT HAND - COMPLETE 3+ VIEW   COMPARISON:  None.  FINDINGS: There is no evidence of acute fracture or dislocation within the hand. Mild interphalangeal degenerative changes are noted. As described on the examination of the left wrist, there is scapholunate diastasis with a well corticated calcific density anterior to the ulnar styloid, best seen on the lateral view.  IMPRESSION: No acute osseous findings. Possible old carpal bone fracture and scapholunate ligament injury.   Electronically Signed   By: Camie Patience M.D.   On: 01/23/2014 19:40   Dg Shoulder Left Port 01/24/2014   CLINICAL DATA:  Post reduction.  EXAM: PORTABLE LEFT SHOULDER - 2+ VIEW  COMPARISON:  01/23/2014  FINDINGS: On this single portable projection, the left humeral head appears reduced. Hill-Sachs deformity. Question fracture of the glenoid with small displaced fragment inferiorly.  Acromioclavicular joint degenerative changes.  IMPRESSION: On this single portable projection, the left humeral head appears reduced. Hill-Sachs deformity.  Question fracture of the glenoid with small displaced fragment inferiorly.   Electronically Signed   By: Chauncey Cruel M.D.   On: 01/24/2014 00:40         Alfonzo Feller, DO 01/25/14 2004

## 2014-01-23 NOTE — ED Provider Notes (Signed)
CSN: [id number]     Arrival date & time 01/23/14  1557 History   First MD Initiated Contact with Patient 01/23/14 1616     Chief Complaint  Patient presents with  . Fall  . Shoulder Injury     (Consider location/radiation/quality/duration/timing/severity/associated sxs/prior Treatment) Patient is a 63 y.o. male presenting with fall and shoulder injury.  Fall  Shoulder Injury   63 yo male presents to ED with acute RIGHT arm pain after mechanical fall from roof, approximately 20 ft in height. Patient states he landed on his left side. Patient admits to LOC but upon wakening was able to get up and ambulate to the house. Patient admits to 10/10 pain to Left upper arm and shoulder. Patient states he is unable to move his arm or wrist secondary to the pain. Admits to tingling in Left hand. Denies any chest pain, SOB, HA, N/V, abdominal pain, or dizziness. Denies any symptoms prior to fall. States he slipped on the roof and fell to the ground. Denies any current medication use or blood thinner use. PMH significant CAD, Osteroarthritis, and Benign neoplasm of colon.  Past Medical History  Diagnosis Date  . Benign neoplasm of colon   . Allergic rhinitis, cause unspecified   . Osteoarthrosis, unspecified whether generalized or localized, unspecified site   . Osteoarthrosis, unspecified whether generalized or localized, lower leg   . Coronary artery disease     Cath 08/15/11 with 30% mid LAD, o/w no CAD  . Abdominal pain     below navel    . Hearing loss   . Inguinal hernia     right   Past Surgical History  Procedure Laterality Date  . Knee surgery    . Hand surgery    . Cardiac catheterization  2012  . Hernia repair  09/05/11    lap RIH w mesh   Family History  Problem Relation Age of Onset  . Diabetes Mother   . Heart disease Father   . Cancer Father     throat  . COPD Father     throat  . Colon cancer Neg Hx   . Prostate cancer Neg Hx    History  Substance Use Topics  .  Smoking status: Never Smoker   . Smokeless tobacco: Never Used  . Alcohol Use: No    Review of Systems  All other systems reviewed and are negative.      Allergies  Shellfish allergy  Home Medications   Current Outpatient Rx  Name  Route  Sig  Dispense  Refill  . acetaminophen (TYLENOL) 500 MG tablet   Oral   Take 500 mg by mouth every 6 (six) hours as needed for mild pain.         Marland Kitchen EXPIRED: albuterol (PROVENTIL HFA;VENTOLIN HFA) 108 (90 BASE) MCG/ACT inhaler   Inhalation   Inhale 2 puffs into the lungs every 4 (four) hours as needed for wheezing or shortness of breath.   1 Inhaler   0   . traMADol (ULTRAM) 50 MG tablet   Oral   Take 1 tablet (50 mg total) by mouth every 6 (six) hours as needed for moderate pain or severe pain.   15 tablet   0    BP 149/79  Pulse 76  Temp(Src) 98.2 F (36.8 C) (Oral)  Resp 16  SpO2 98% Physical Exam  Nursing note and vitals reviewed. Constitutional: He is oriented to person, place, and time. He appears well-developed and well-nourished. He is cooperative.  He does not appear ill. He appears distressed.  HENT:  Head: Normocephalic. Head is with abrasion. Head is without raccoon's eyes, without Battle's sign, without laceration, without right periorbital erythema and without left periorbital erythema.    Right Ear: Tympanic membrane and ear canal normal. No drainage. No hemotympanum.  Left Ear: Tympanic membrane and ear canal normal. No drainage. No hemotympanum.  Nose: Nose normal.  Eyes: Conjunctivae and EOM are normal. Pupils are equal, round, and reactive to light. No scleral icterus.  Neck: Normal range of motion. Neck supple. No JVD present. No tracheal deviation present.  Cardiovascular: Normal rate, regular rhythm and intact distal pulses.  Exam reveals no gallop and no friction rub.   No murmur heard. Pulmonary/Chest: Breath sounds normal. Tachypnea noted. No respiratory distress. He has no decreased breath sounds. He  has no wheezes. He has no rhonchi. He has no rales.  Abdominal: Soft. Normal appearance and bowel sounds are normal. He exhibits no distension. There is no hepatosplenomegaly. There is no tenderness. There is no rigidity, no rebound and no guarding.    Musculoskeletal: Normal range of motion. He exhibits no edema.  No midline spinal tenderness  Tenderness to palpation of LEFT proximal humerus. Pain with movement of of LEFT shoulder. Loss of rounded curvature of LEFT shoulder. LEFT arm held tight to patient side with Left hand across abdomen.     Neurological: He is alert and oriented to person, place, and time. GCS eye subscore is 4. GCS verbal subscore is 5. GCS motor subscore is 6.  Sensation intact bilaterally though patient admits to tingling in LEft hand.     Skin: Skin is warm and dry. He is not diaphoretic.     Psychiatric: He has a normal mood and affect. His behavior is normal.    ED Course  Procedures (including critical care time) Labs Review Labs Reviewed  CBC WITH DIFFERENTIAL - Abnormal; Notable for the following:    WBC 11.7 (*)    MCHC 36.4 (*)    Neutro Abs 9.1 (*)    All other components within normal limits  COMPREHENSIVE METABOLIC PANEL - Abnormal; Notable for the following:    Glucose, Bld 135 (*)    GFR calc non Af Amer 71 (*)    GFR calc Af Amer 82 (*)    All other components within normal limits   Imaging Review Dg Elbow Complete Left  01/23/2014   CLINICAL DATA:  Trauma. [place] off house. Patient declined further imaging of the elbow.  EXAM: LEFT ELBOW - COMPLETE 3+ VIEW  COMPARISON:  None.  FINDINGS: Three views the left elbow are limited by positioning. No gross fracture is evident, but assessment for joint effusion is not possible. Subtle or nondisplaced fractures could be obscured.  IMPRESSION: No gross fracture evident although study is limited.   Electronically Signed   By: Misty Stanley M.D.   On: 01/23/2014 19:43   Dg Wrist Complete  Left  01/23/2014   CLINICAL DATA:  Fall from roof.  Wrist pain.  EXAM: LEFT WRIST - COMPLETE 3+ VIEW  COMPARISON:  None.  FINDINGS: No definite acute fracture or dislocation is identified. There is radiocarpal joint space loss with mild scapholunate diastasis. On the lateral view, there is a well corticated ossific density anterior to the ulnar styloid which may be related to an old carpal bone fracture.  IMPRESSION: No definite acute osseous findings. Possible old carpal bone fracture with mild scapholunate diastasis, suggesting scapholunate ligament injury.  Electronically Signed   By: Camie Patience M.D.   On: 01/23/2014 19:38   Ct Head Wo Contrast  01/23/2014   CLINICAL DATA:  Status post fall from as far as 38 phi  EXAM: CT HEAD WITHOUT CONTRAST  CT CERVICAL SPINE WITHOUT CONTRAST  TECHNIQUE: Multidetector CT imaging of the head and cervical spine was performed following the standard protocol without intravenous contrast. Multiplanar CT image reconstructions of the cervical spine were also generated.  COMPARISON:  None.  FINDINGS: CT HEAD FINDINGS  The ventricles are normal in size and position. There is no intracranial hemorrhage nor intracranial mass effect. There is no evidence of an evolving ischemic infarction. There are no abnormal intracranial calcifications. The cerebellum and brainstem are normal in appearance.  At bone window settings the observed portions of the paranasal sinuses and mastoid air cells are clear. There is no evidence of an acute skull fracture.  CT CERVICAL SPINE FINDINGS  The cervical vertebral bodies are preserved in height. There is mild loss of the normal cervical lordosis. The intervertebral disc space height is decreased at C5-6. There are anterior endplate osteophytes at C4-5 and C5-6 and C6-7. There is no evidence of a perched facet nor spinous process fracture. The prevertebral soft tissue spaces appear normal. The odontoid is intact and the lateral masses of C1 align  normally with those of C2. The bony ring at each cervical level is intact. The observed portions of the first rib appear normal.  IMPRESSION: 1. There is no acute intracranial abnormality nor evidence of an acute skull fracture. 2. There is mild loss of the normal cervical lordosis likely secondary to muscle spasm. There is no evidence of an acute fracture or dislocation. There is degenerative disc change of the mid and lower cervical spine.   Electronically Signed   By: David  Martinique   On: 01/23/2014 18:47   Ct Chest W Contrast  01/23/2014   CLINICAL DATA:  Fall from roof  EXAM: CT CHEST, ABDOMEN, AND PELVIS WITH CONTRAST  TECHNIQUE: Multidetector CT imaging of the chest, abdomen and pelvis was performed following the standard protocol during bolus administration of intravenous contrast.  CONTRAST:  170mL OMNIPAQUE IOHEXOL 300 MG/ML  SOLN  COMPARISON:  None.  FINDINGS: CT CHEST FINDINGS  There is a left anterior shoulder dislocation with Hill-Sachs fracture along the posterior aspect of the left humeral head.  No evidence of aortic dissection. No mediastinal hematoma. No pericardial fluid.  There is no pneumothorax or pulmonary contusion. Mild basilar atelectasis and bronchiectasis present. No evidence of rib fracture or scapular fracture.  CT ABDOMEN AND PELVIS FINDINGS  No evidence of hepatic trauma. There several low-density lesions in the periphery of the right hepatic alone and superiorly in the left hepatic lobe which likely represents a hemangiomas or cysts but are technically indeterminate. Spleen demonstrates no evidence of trauma. Adrenal glands and kidneys are normal. The pancreas and duodenum appear normal. There is no evidence of injury to the abdominal aorta.  No free fluid in the abdomen or pelvis. No evidence of pelvic fracture or spine fracture. Bladder is intact.  IMPRESSION: 1. Left anterior shoulder dislocation with Hill-Sachs fracture. 2. No evidence of traumatic injury to the aorta. 3. No  pneumothorax. 4. No evidence of trauma in abdomen pelvis. 5. No evidence of pelvic fracture or spine fracture. 6. Several low-density lesions within the liver which cannot be fully characterized likely represent cysts or hemangiomas but cannot be fully characterized due to small size. Findings conveyed  toJACOB, Farley Crooker on 01/23/2014  at19:05.   Electronically Signed   By: Suzy Bouchard M.D.   On: 01/23/2014 19:06   Ct Cervical Spine Wo Contrast  01/23/2014   CLINICAL DATA:  Status post fall from as far as 17 phi  EXAM: CT HEAD WITHOUT CONTRAST  CT CERVICAL SPINE WITHOUT CONTRAST  TECHNIQUE: Multidetector CT imaging of the head and cervical spine was performed following the standard protocol without intravenous contrast. Multiplanar CT image reconstructions of the cervical spine were also generated.  COMPARISON:  None.  FINDINGS: CT HEAD FINDINGS  The ventricles are normal in size and position. There is no intracranial hemorrhage nor intracranial mass effect. There is no evidence of an evolving ischemic infarction. There are no abnormal intracranial calcifications. The cerebellum and brainstem are normal in appearance.  At bone window settings the observed portions of the paranasal sinuses and mastoid air cells are clear. There is no evidence of an acute skull fracture.  CT CERVICAL SPINE FINDINGS  The cervical vertebral bodies are preserved in height. There is mild loss of the normal cervical lordosis. The intervertebral disc space height is decreased at C5-6. There are anterior endplate osteophytes at C4-5 and C5-6 and C6-7. There is no evidence of a perched facet nor spinous process fracture. The prevertebral soft tissue spaces appear normal. The odontoid is intact and the lateral masses of C1 align normally with those of C2. The bony ring at each cervical level is intact. The observed portions of the first rib appear normal.  IMPRESSION: 1. There is no acute intracranial abnormality nor evidence of an acute  skull fracture. 2. There is mild loss of the normal cervical lordosis likely secondary to muscle spasm. There is no evidence of an acute fracture or dislocation. There is degenerative disc change of the mid and lower cervical spine.   Electronically Signed   By: David  Martinique   On: 01/23/2014 18:47   Dg Shoulder Left  01/23/2014   CLINICAL DATA:  Fall from roof  EXAM: LEFT SHOULDER - 2+ VIEW  COMPARISON:  DG ELBOW COMPLETE*L* dated 01/23/2014  FINDINGS: There is anterior left shoulder dislocation with the humeral head anterior medial to the glenoid fossa. Hill-Sachs deformity in the posterior humeral head.  IMPRESSION: Anterior left shoulder dislocation with Hill-Sachs deformity.   Electronically Signed   By: Suzy Bouchard M.D.   On: 01/23/2014 19:44   Dg Shoulder Left Port  01/24/2014   CLINICAL DATA:  Post reduction.  EXAM: PORTABLE LEFT SHOULDER - 2+ VIEW  COMPARISON:  01/23/2014  FINDINGS: On this single portable projection, the left humeral head appears reduced. Hill-Sachs deformity. Question fracture of the glenoid with small displaced fragment inferiorly.  Acromioclavicular joint degenerative changes.  IMPRESSION: On this single portable projection, the left humeral head appears reduced. Hill-Sachs deformity.  Question fracture of the glenoid with small displaced fragment inferiorly.   Electronically Signed   By: Chauncey Cruel M.D.   On: 01/24/2014 00:40   Dg Shoulder Left Port  01/23/2014   CLINICAL DATA:  Post attempted reduction.  EXAM: PORTABLE LEFT SHOULDER - 2+ VIEW  COMPARISON:  01/23/2014.  FINDINGS: Left humeral head remains dislocated. On the prior oblique view, this was noted to be located anteriorly. Impaction against the anterior glenoid with Hill-Sachs deformity.  Pulmonary vascular prominence.  Acromioclavicular joint degenerative changes.  IMPRESSION: Left humeral head remains dislocated. On the prior oblique view, this was noted to be located anteriorly. Impaction against the anterior  glenoid with Hill-Sachs  deformity.   Electronically Signed   By: Chauncey Cruel M.D.   On: 01/23/2014 23:01   Dg Shoulder Left Port  01/23/2014   CLINICAL DATA:  Postreduction.  EXAM: PORTABLE LEFT SHOULDER - 2+ VIEW  COMPARISON:  Earlier same date.  FINDINGS: 2101 hrs. There is persistent anterior inferior glenohumeral dislocation. Hill-Sachs deformity of the humeral head is not well visualized on this examination.  IMPRESSION: Persistent dislocation of the left glenohumeral joint.   Electronically Signed   By: Camie Patience M.D.   On: 01/23/2014 21:21   Dg Hand Complete Left  01/23/2014   CLINICAL DATA:  Fall from roof.  Hand pain.  EXAM: LEFT HAND - COMPLETE 3+ VIEW  COMPARISON:  None.  FINDINGS: There is no evidence of acute fracture or dislocation within the hand. Mild interphalangeal degenerative changes are noted. As described on the examination of the left wrist, there is scapholunate diastasis with a well corticated calcific density anterior to the ulnar styloid, best seen on the lateral view.  IMPRESSION: No acute osseous findings. Possible old carpal bone fracture and scapholunate ligament injury.   Electronically Signed   By: Camie Patience M.D.   On: 01/23/2014 19:40     EKG Interpretation None       MDM   Final diagnoses:  Fall at home  Dislocation of left shoulder joint   Leukocytosis at 11.7. Suspect secondary to recent trauma CMP WNL. CT head /Cspine shows no acute intracranial abnormality or skull fracture. Mild loss of cervical lordosis suspect secondary to muscle spasm.  Plain films shows Anterior LEFT shoulder dislocation with hill sachs lesion. Also scapholunate lingament injury to LEFT.  wrist with possible old carpal bone fracture. No evidence of traumatic injury to the aorta. No pneumothorax.  No evidence of trauma in abdomen or pelvis.  No evidence of pelvic fracture or spine fracture.  Several low-density lesions within the liver suggestive of cysts or hemangiomas    Patient pain improved with pain control in ED. Reduction of left shoulder attempted x 2 with good sedation and muscle relaxation. Shoulder reduced multiple times but continues to dislocate again during procedure. Sensation intact with good radial pulse. Ortho consulted for assistance in reduction. Reduction attempt by Dr. Alvan Dame successful with post procedure films showing LEFT shoulder reduced. Patient placed in sling. Patient has good sensation and good pulses. Patient able to move digits freely. Plan to discharge patient home with pain meds and ortho follow up.   Patient discussed with Dr. Thurnell Garbe.   Meds given in ED:  Medications  HYDROmorphone (DILAUDID) injection 1 mg (1 mg Intravenous Given 01/23/14 1644)  ondansetron (ZOFRAN) injection 4 mg (4 mg Intravenous Given 01/23/14 1643)  sodium chloride 0.9 % bolus 1,000 mL (0 mLs Intravenous Stopped 01/23/14 1801)  fentaNYL (SUBLIMAZE) injection 50 mcg (50 mcg Intravenous Given 01/23/14 1759)  iohexol (OMNIPAQUE) 300 MG/ML solution 100 mL (100 mLs Intravenous Contrast Given 01/23/14 1837)  fentaNYL (SUBLIMAZE) injection 50 mcg (50 mcg Intravenous Given 01/23/14 1946)  etomidate (AMIDATE) injection 10 mg (10 mg Intravenous Given 01/23/14 2035)  fentaNYL (SUBLIMAZE) injection 50 mcg (50 mcg Intravenous Given 01/23/14 2035)  Tdap (BOOSTRIX) injection 0.5 mL (0.5 mLs Intramuscular Given 01/23/14 2051)  etomidate (AMIDATE) injection (10 mg Intravenous Given 01/23/14 2038)  fentaNYL (SUBLIMAZE) injection (50 mcg Intravenous Given 01/23/14 2041)  etomidate (AMIDATE) injection 10 mg (10 mg Intravenous Given 01/23/14 2153)  fentaNYL (SUBLIMAZE) injection 50 mcg (50 mcg Intravenous Given 01/23/14 2153)  fentaNYL (SUBLIMAZE) injection (100  mcg Intravenous Given 01/23/14 2147)  etomidate (AMIDATE) injection (5 mg Intravenous Given 01/23/14 2142)  fentaNYL (SUBLIMAZE) injection (50 mcg Intravenous Given 01/23/14 2324)  etomidate (AMIDATE) injection (5 mg Intravenous Given 01/23/14 2322)     Discharge Medication List as of 01/24/2014 12:12 AM    START taking these medications   Details  traMADol (ULTRAM) 50 MG tablet Take 1 tablet (50 mg total) by mouth every 6 (six) hours as needed for moderate pain or severe pain., Starting 01/24/2014, Until Discontinued, Print            Dossie Arbour [place], [place] 01/24/14 [phone]

## 2014-01-23 NOTE — ED Notes (Signed)
Orthopedist and EDP remain at bedside for manipulation.

## 2014-01-23 NOTE — ED Notes (Signed)
Xray called for portable xray of left shoulder.

## 2014-01-23 NOTE — ED Notes (Signed)
MD at bedside. Ortho

## 2014-01-23 NOTE — ED Notes (Signed)
Pt reports that he fell from roof- approx 60ft. Landing on L side. Reports severe L shoulder pain. Reports loss consciousness briefly and now feels like he is going to pass out. Pt taking short shallow breaths.

## 2014-01-24 MED ORDER — TRAMADOL HCL 50 MG PO TABS: 50.0000 mg | ORAL_TABLET | Freq: Four times a day (QID) | ORAL | Status: DC | PRN

## 2014-01-24 MED ORDER — FENTANYL CITRATE 0.05 MG/ML IJ SOLN: 100.0000 ug | Freq: Once | INTRAMUSCULAR | Status: DC

## 2014-01-24 NOTE — ED Provider Notes (Signed)
Procedural sedation Date/Time: 01/24/2014 1:57 AM Performed by: Sinda Du Authorized by: Sinda Du Consent: Verbal consent obtained. written consent obtained. Risks and benefits: risks, benefits and alternatives were discussed Consent given by: patient Patient understanding: patient states understanding of the procedure being performed Patient consent: the patient's understanding of the procedure matches consent given Procedure consent: procedure consent matches procedure scheduled Relevant documents: relevant documents present and verified Test results: test results available and properly labeled Site marked: the operative site was marked Imaging studies: imaging studies available Required items: required blood products, implants, devices, and special equipment available Patient identity confirmed: verbally with patient, arm band, provided demographic data and hospital-assigned identification number Time out: Immediately prior to procedure a "time out" was called to verify the correct patient, procedure, equipment, support staff and site/side marked as required. Preparation: Patient was prepped and draped in the usual sterile fashion. Local anesthesia used: no Patient sedated: yes Sedatives: etomidate Vitals: Vital signs were monitored during sedation. Patient tolerance: Patient tolerated the procedure well with no immediate complications.      Sinda Du, MD 01/24/14 [phone]

## 2014-01-24 NOTE — ED Notes (Signed)
Pt CAOx4 at discharge.  Pt back to baseline.  Decreased amount of pain reported at 1/10.  No additional questions at discharge.

## 2014-01-24 NOTE — Discharge Instructions (Signed)
°Emergency Department Resource Guide °1) Find a Doctor and Pay Out of Pocket °Although you won't have to find out who is covered by your insurance plan, it is a good idea to ask around and get recommendations. You will then need to call the office and see if the doctor you have chosen will accept you as a new patient and what types of options they offer for patients who are self-pay. Some doctors offer discounts or will set up payment plans for their patients who do not have insurance, but you will need to ask so you aren't surprised when you get to your appointment. ° °2) Contact Your Local Health Department °Not all health departments have doctors that can see patients for sick visits, but many do, so it is worth a call to see if yours does. If you don't know where your local health department is, you can check in your phone book. The CDC also has a tool to help you locate your state's health department, and many state websites also have listings of all of their local health departments. ° °3) Find a Walk-in Clinic °If your illness is not likely to be very severe or complicated, you may want to try a walk in clinic. These are popping up all over the country in pharmacies, drugstores, and shopping centers. They're usually staffed by nurse practitioners or physician assistants that have been trained to treat common illnesses and complaints. They're usually fairly quick and inexpensive. However, if you have serious medical issues or chronic medical problems, these are probably not your best option. ° °No Primary Care Doctor: °- Call [facility] at  832-8000 - they can help you locate a primary care doctor that  accepts your insurance, provides certain services, etc. °- Physician Referral Service- 1-800-533-3463 ° °Chronic Pain Problems: °Organization         Address  Phone   Notes  °[facility]  (336) 297-2271 Patients need to be referred by their primary care doctor.  ° °Medication  Assistance: °Organization         Address  Phone   Notes  °[place] [facility] [address] °[place], [place] [address] (336) 641-8030 --Must be a resident of [place] °-- Must have NO insurance coverage whatsoever (no Medicaid/ Medicare, etc.) °-- The pt. MUST have a primary care doctor that directs their care regularly and follows them in the community °  °[facility]  (866) 331-1348   °[employer]  (888) 892-1162   ° °Agencies that provide inexpensive medical care: °Organization         Address  Phone   Notes  °[facility]  (336) 832-8035   °[facility]    (336) 832-7272   °[facility] [address] °[place], [place] [address] (336) 832-4777   °[facility] [address], °[place] (336) 271-4999   °[facility]    (336) 373-0678   °[facility]    (336) 272-1050   °[facility] ° [address], [place] Phone:  (336) 832-4444, Fax:  (336) 832-4440 Hours of Operation:  9 am - 6 pm, M-F.  Also accepts Medicaid/Medicare and self-pay.  °[facility] ° [address], [place] Phone: (336) 832-3150, Fax: (336) [phone]. Hours of Operation:  8:30 am - 5:30 pm, M-F.  Also accepts Medicaid and self-pay.  °[facility] [place] [address], [place] Phone: (336) 878-6027   °[facility] [address], [place], [place] (336)723-1848, Ext. 123 Mondays & Thursdays: 7-9 AM.  First 15 patients are seen on a first come, first serve basis. °  ° °Medicaid-accepting [place] Providers: ° °Organization         Address  Phone   Notes  °[facility] [address], [place] (336) 641-2100 Also accepts self-pay patients.  °[facility] [address], [place] ° (336) 856-9996   °[facility] [address], [place]  (336) 288-8857   °[facility] [address] [place] [address], [place] (336) 299-7000   °Veita Bland [address], [place]  ° (336) 373-1557 Only accepts [place] Access Medicaid patients after they have their name applied to their card.  ° °Self-Pay (no insurance) in [place]: ° °Organization         Address  Phone   Notes  °Sickle Cell Patients, [facility] [address], [place] (336) 832-1970   °[facility] [address], [place] (336) 832-4400   °[facility] ° [address], [place] (336) 992-4800   °[facility]/Dr. Osei-Bonsu ° [address] [place] [address], [place] or [address], [place] (336) 841-8500 Phone number for both [place] and [place] locations is the same.  °[facility] [address], [place] (336) 299-0000   °[facility] [address] [place] [address], [place] or [address] (336) 852-7530 °(336) 878-2260   °[facility] [address], [place] (336) 350-1642, phone; (336) 294-5005, fax Sees patients 1st and 3rd Saturday of every month.  Must not qualify for public or private insurance (i.e. Medicaid, Medicare, [place] Health Choice, Veterans' Benefits) • Household income should be no more than 200% of the poverty level •The clinic cannot treat you if you are pregnant or think you are pregnant • Sexually transmitted diseases are not treated at the clinic.  ° ° °Dental Care: °Organization         Address  Phone  Notes  °[place] [facility] [address], [place] (336) 641-6152 Accepts children up to age 21 who are enrolled in Medicaid or [place] Health Choice; pregnant women with a Medicaid card; and children who have applied for Medicaid or [place] Health Choice, but were declined, whose parents can pay a reduced fee at time of service.  °[place]  [facility] [place]  [address], [place] (336) 641-7733 Accepts children up to age 21 who are enrolled in Medicaid or [place] Health Choice; pregnant women with a Medicaid card; and children who have applied for Medicaid or [place] Health Choice, but were declined, whose parents can pay a reduced fee at time of service.  °[facility] ° [address], [place] (336) 641-4533 Patients are seen by appointment only. Walk-ins are not accepted. [facility] will see patients 18 years of age and older. °Monday - Tuesday (8am-5pm) °Most Wednesdays (8:30-5pm) °$30 per visit, cash only  °[facility] ° [address], [place] (336) 641-4533 Patients are seen by appointment only. Walk-ins are not accepted. [facility] will see patients 18 years of age and older. °One   Wednesday Evening (Monthly: Volunteer Based).  $30 per visit, cash only  °[facility]  (919) 537-3737 for adults; Children under age 4, call Graduate Pediatric Dentistry at ([phone]) [phone]. Children aged 4-14, please call (919) [phone] to request a pediatric application. ° Dental services are provided in all areas of dental care including fillings, crowns and bridges, complete and partial dentures, implants, gum treatment, root canals, and extractions. Preventive care is also provided. Treatment is provided to both adults and children. °Patients are selected via a lottery and there is often a waiting list. °  °[facility] [address], °[place] ° (336) 763-8833 [email or web address] °  °[facility] [address], [place], [place] (336)723-1848, Ext. 123 Second and Fourth Thursday of each month, opens at 6:30 AM; Clinic ends at 9 AM.  Patients are seen on a first-come first-served basis, and a limited number are seen during each clinic.  ° °[facility] ° [address], [place], [place] (336) [phone]    Eligibility Requirements °You must have lived in [place], [place], or [place] counties for at least the last three months. °  You cannot be eligible for state or federal sponsored healthcare insurance, including Veterans Administration, Medicaid, or Medicare. °  You generally cannot be eligible for healthcare insurance through your employer.  °  How to apply: °Eligibility screenings are held every Tuesday and Wednesday afternoon from 1:00 pm until 4:00 pm. You do not need an appointment for the interview!  °[facility] [address], [place], [place] 336-631-2330   °[facility]  336-342-8273   °[facility]  336-703-3100   °[facility]  336-570-6415   ° °Behavioral Health Resources in the Community: °Intensive Outpatient Programs °Organization         Address  Phone  Notes  °[facility] [address], [place], [place] 336-878-6098   °[facility] [address], [place], [place] 336-832-9800   °[facility] [address], [place], [place] ° 336-882-2125   °[facility] [address],  °[place], [place] 1-800-853-5163 or 336-641-4981   °Substance Abuse Resources °Organization         Address  Phone  Notes  °[facility]  336-882-2125   °[facility]  336-784-9470   °[facility]  336-285-9073   °[facility]  336-845-3988   °Residential & Outpatient Substance Abuse Program  1-800-659-3381   °Psychological Services °Organization         Address  Phone  Notes  °[facility]  336- 832-9600   °[facility]  336- 378-7881   °[facility] [address], [place] 1-800-853-5163 or 336-641-4981   ° °Mobile Crisis Teams °Organization         Address  Phone  Notes  °[facility], Mobile Crisis Care Unit  1-877-626-1772   °Assertive °[facility] ° [address]  [place], [place] 336-834-9664   °Sharon DeEsch [address] °[place] 336-554-5454   ° °Self-Help/Support Groups °Organization         Address  Phone             Notes  °[facility] - variety of support groups  336- 373-1402 Call for more information  °Narcotics Anonymous (NA), [facility] [address], °[place]  2 meetings at this location  ° °  Residential Treatment Programs Organization         Address  Phone  Notes  [facility] [address],    [place]  1-(581) 526-7917   [facility]  [address], [place] [address], [place], [place]   [place], [place] 437-843-6864 Admissions: 8am-3pm M-F  [facility] [address],    [place], [place] 035-009-3818   [facility] [address] [place], [place], [place]   [facility] [address]  [place], [place]   [facility] - Intensive Outpatient [address], [place], [place]   [facility] [address]  [place], [place] 1-(845)670-9522 or 346-071-5113   [facility] [address], [place], [place] Accepts Medicaid  [facility] [address],  [place] 1-(251)356-9215 Substance Abuse/Addiction Treatment   [facility]         Address  Phone  Notes  [facility]  (619)832-0226   Domenic Schwab, PhD [address] [place], [place]   860-505-9264 or 413-584-4718   [facility], [place] 628-039-8018   [facility] [address] [place], [place] (475)[phone] Insurance/Medicaid/sponsorship through [facility] [address]                                    [place], [place] [phone] [place] [address], [place] [phone]    Dr. Adele Schilder  (234)[phone]   [facility] Dept. 1) [address], [place] 2) [place] 3)  [address], [place] [phone]  [phone]   [place] [phone] or ([phone] (After Hours)       Take the prescription as directed.  Apply moist heat or ice to the area(s) of discomfort, for 15 minutes at a time, several times per day for the next few days.  Do not fall asleep on a heating or ice pack. Continue to wear the sling on your left arm until you are seen in follow up by the Orthopedic doctor.  Call your regular medical doctor tomorrow to schedule a follow up appointment in the next 2 days. Call the Orthopedic doctor tomorrow to schedule a follow up appointment within the next week. Return to the Emergency Department immediately if worsening.

## 2014-01-24 NOTE — Consult Note (Signed)
Reason for Consult: Left shoulder dislocation Referring Physician:  ED MD, Bradley Gilbert is an 63 y.o. male.  HPI: 63 yo RHD male fell 20 ft off roof trying to repair gutter.  Reported amnesia at scene but predominant complaint is shoulder pain.  After thorough work in [place] and 2 attempts at closed reduction I was contacted to assist.  Patient had been patient of Dr. Shellia Carwin in past. At time of my evaluation he was still reporting 8/10 pain.  Some numbness/tingling in left fingers non-specific  Past Medical History  Diagnosis Date  . Benign neoplasm of colon   . Allergic rhinitis, cause unspecified   . Osteoarthrosis, unspecified whether generalized or localized, unspecified site   . Osteoarthrosis, unspecified whether generalized or localized, lower leg   . Coronary artery disease     Cath 08/15/11 with 30% mid LAD, o/w no CAD  . Abdominal pain     below navel    . Hearing loss   . Inguinal hernia     right    Past Surgical History  Procedure Laterality Date  . Knee surgery    . Hand surgery    . Cardiac catheterization  2012  . Hernia repair  09/05/11    lap RIH w mesh    Family History  Problem Relation Age of Onset  . Diabetes Mother   . Heart disease Father   . Cancer Father     throat  . COPD Father     throat  . Colon cancer Neg Hx   . Prostate cancer Neg Hx     Social History:  reports that he has never smoked. He has never used smokeless tobacco. He reports that he does not drink alcohol or use illicit drugs.  Allergies:  Allergies  Allergen Reactions  . Shellfish Allergy     Eyes swell up.    Medications:  I have reviewed the patient's current medications. Scheduled: . etomidate      . etomidate  20 mg Intravenous Once  . fentaNYL      . fentaNYL  50 mcg Intravenous Once    Results for orders placed during the hospital encounter of 01/23/14 (from the past 24 hour(s))  CBC WITH DIFFERENTIAL     Status: Abnormal   Collection Time     01/23/14  4:47 PM      Result Value Ref Range   WBC 11.7 (*) 4.0 - 10.5 K/uL   RBC 5.10  4.22 - 5.81 MIL/uL   Hemoglobin 16.5  13.0 - 17.0 g/dL   HCT 45.3  39.0 - 52.0 %   MCV 88.8  78.0 - 100.0 fL   MCH 32.4  26.0 - 34.0 pg   MCHC 36.4 (*) 30.0 - 36.0 g/dL   RDW 12.5  11.5 - 15.5 %   Platelets 194  150 - 400 K/uL   Neutrophils Relative % 77  43 - 77 %   Neutro Abs 9.1 (*) 1.7 - 7.7 K/uL   Lymphocytes Relative 16  12 - 46 %   Lymphs Abs 1.9  0.7 - 4.0 K/uL   Monocytes Relative 6  3 - 12 %   Monocytes Absolute 0.7  0.1 - 1.0 K/uL   Eosinophils Relative 1  0 - 5 %   Eosinophils Absolute 0.1  0.0 - 0.7 K/uL   Basophils Relative 0  0 - 1 %   Basophils Absolute 0.0  0.0 - 0.1 K/uL  COMPREHENSIVE METABOLIC PANEL  Status: Abnormal   Collection Time    01/23/14  4:47 PM      Result Value Ref Range   Sodium 139  137 - 147 mEq/L   Potassium 3.7  3.7 - 5.3 mEq/L   Chloride 101  96 - 112 mEq/L   CO2 23  19 - 32 mEq/L   Glucose, Bld 135 (*) 70 - 99 mg/dL   BUN 19  6 - 23 mg/dL   Creatinine, Ser 1.08  0.50 - 1.35 mg/dL   Calcium 9.9  8.4 - 10.5 mg/dL   Total Protein 7.5  6.0 - 8.3 g/dL   Albumin 4.2  3.5 - 5.2 g/dL   AST 22  0 - 37 U/L   ALT 23  0 - 53 U/L   Alkaline Phosphatase 49  39 - 117 U/L   Total Bilirubin 0.7  0.3 - 1.2 mg/dL   GFR calc non Af Amer 71 (*) >90 mL/min   GFR calc Af Amer 82 (*) >90 mL/min    X-ray: Left shoulder X-rays reveal persistent anterior shoulder dislocation with associated Hill-Sachs defect posteriorly  Review of Systems  Constitutional: Negative.   HENT: Negative.   Eyes: Negative.   Respiratory: Negative.   Cardiovascular: Negative.   Gastrointestinal: Negative.   Genitourinary: Negative.   Musculoskeletal: Negative.   Skin: Negative.   Neurological: Negative.   Psychiatric/Behavioral: Negative.      Blood pressure 149/79, pulse 76, temperature 98.2 F (36.8 C), temperature source Oral, resp. rate 16, SpO2 98.00%.  Physical Exam   Constitutional: He appears well-developed and well-nourished.  HENT:  Head: Normocephalic.  Bruise around left eye  Eyes: EOM are normal. Pupils are equal, round, and reactive to light.  Cardiovascular: Normal rate and regular rhythm.   Respiratory: Effort normal and breath sounds normal.  GI: Soft.  Musculoskeletal:  At time of evaluation left arm internally rotated in sling partially. Pain with any movement. NVI No other left upper extremity, right upper extremity or lower extremity findings  Neurological: He is alert.  Psychiatric: He has a normal mood and affect. His behavior is normal.    Assessment/Plan: Persistent anterior left shoulder dislocation  In ER under Etomidate sedation with fentanyl supervised by ED physician closed reduction through various techniques and maneuvers successfully reduced the shoulder. He recognized immediate pain relief Placed in sling Plans for follow up discussed with he and his wife ICE, pain meds from ER  Follow up with Alvan Dame in 2 weeks, call with questions or any acute events  Bradley Gilbert 01/24/2014, 12:12 AM

## 2014-01-25 NOTE — ED Provider Notes (Signed)
I saw and evaluated the patient, reviewed the resident's note and I agree with the findings and plan. Please see my previous note.     Alfonzo Feller, DO 01/25/14 1952

## 2014-01-25 NOTE — ED Provider Notes (Signed)
Medical screening examination/treatment/procedure(s) were conducted as a shared visit with non-physician practitioner(s) and myself.  I personally evaluated the patient during the encounter. Please see my previous note.    Alfonzo Feller, DO 01/25/14 2004

## 2014-01-31 ENCOUNTER — Encounter: Payer: Self-pay | Admitting: Internal Medicine

## 2014-01-31 ENCOUNTER — Ambulatory Visit (INDEPENDENT_AMBULATORY_CARE_PROVIDER_SITE_OTHER): Payer: 59 | Admitting: Internal Medicine

## 2014-01-31 VITALS — BP 122/80 | HR 72 | Temp 97.6°F | Resp 16 | Ht 70.0 in | Wt 203.5 lb

## 2014-01-31 DIAGNOSIS — S43005A Unspecified dislocation of left shoulder joint, initial encounter: Secondary | ICD-10-CM

## 2014-01-31 DIAGNOSIS — S43006A Unspecified dislocation of unspecified shoulder joint, initial encounter: Secondary | ICD-10-CM

## 2014-01-31 NOTE — Progress Notes (Signed)
Pre visit review using our clinic review tool, if applicable. No additional management support is needed unless otherwise documented below in the visit note. 

## 2014-01-31 NOTE — Patient Instructions (Signed)
Dislocated shoulder - reduced in ED. If there is no torn rotator cuff or other ligamentous injury you should have a full recovery without limitation.  Plan See Dr. Alvan Dame for follow-up  For pain take 4 advil at a time (800 mg) three times a day. Watch for any stomach irritation.  Take tylenol 500 mg 2 tabs three times a day.  Call if this does not give adequate relief.   Shoulder Dislocation Your shoulder is made up of three bones: the collar bone (clavicle); the shoulder blade (scapula), which includes the socket (glenoid cavity); and the upper arm bone (humerus). Your shoulder joint is the place where these bones meet. Strong, fibrous tissues hold these bones together (ligaments). Muscles and strong, fibrous tissues that connect the muscles to these bones (tendons) allow your arm to move through this joint. The range of motion of your shoulder joint is more extensive than most of your other joints, and the glenoid cavity is very shallow. That is the reason that your shoulder joint is one of the most unstable joints in your body. It is far more prone to dislocation than your other joints. Shoulder dislocation is when your humerus is forced out of your shoulder joint. CAUSES Shoulder dislocation is caused by a forceful impact on your shoulder. This impact usually is from an injury, such as a sports injury or a fall. SYMPTOMS Symptoms of shoulder dislocation include:  Deformity of your shoulder.  Intense pain.  Inability to move your shoulder joint.  Numbness, weakness, or tingling around your shoulder joint (your neck or down your arm).  Bruising or swelling around your shoulder. DIAGNOSIS In order to diagnose a dislocated shoulder, your caregiver will perform a physical exam. Your caregiver also may have an X-ray exam done to see if you have any broken bones. Magnetic resonance imaging (MRI) is a procedure that sometimes is done to help your caregiver see any damage to the soft tissues around  your shoulder, particularly your rotator cuff tendons. Additionally, your caregiver also may have electromyography done to measure the electrical discharges produced in your muscles if you have signs or symptoms of nerve damage. TREATMENT A shoulder dislocation is treated by placing the humerus back in the joint (reduction). Your caregiver does this either manually (closed reduction), by moving your humerus back into the joint through manipulation, or through surgery (open reduction). When your humerus is back in place, severe pain should improve almost immediately. You also may need to have surgery if you have a weak shoulder joint or ligaments, and you have recurring shoulder dislocations, despite rehabilitation. In rare cases, surgery is necessary if your nerves or blood vessels are damaged during the dislocation. After your reduction, your arm will be placed in a shoulder immobilizer or sling to keep it from moving. Your caregiver will have you wear your shoulder immoblizer or sling for 3 days to 3 weeks, depending on how serious your dislocation is. When your shoulder immobilizer or sling is removed, your caregiver may prescribe physical therapy to help improve the range of motion in your shoulder joint. HOME CARE INSTRUCTIONS  The following measures can help to reduce pain and speed up the healing process:  Rest your injured joint. Do not move it. Avoid activities similar to the one that caused your injury.  Apply ice to your injured joint for the first day or two after your reduction or as directed by your caregiver. Applying ice helps to reduce inflammation and pain.  Put ice in a  plastic bag.  Place a towel between your skin and the bag.  Leave the ice on for 15-20 minutes at a time, every 2 hours while you are awake.  Exercise your hand by squeezing a soft ball. This helps to eliminate stiffness and swelling in your hand and wrist.  Take over-the-counter or prescription medicine for  pain or discomfort as told by your caregiver. SEEK IMMEDIATE MEDICAL CARE IF:   Your shoulder immobilizer or sling becomes damaged.  Your pain becomes worse rather than better.  You lose feeling in your arm or hand, or they become white and cold. MAKE SURE YOU:   Understand these instructions.  Will watch your condition.  Will get help right away if you are not doing well or get worse. Document Released: 08/05/2001 Document Revised: 02/02/2012 Document Reviewed: 08/31/2011 [facility] Patient Information 2014 [place], [place].

## 2014-02-02 NOTE — Progress Notes (Signed)
Subjective:    Patient ID: Bradley Gilbert, male    DOB: 19-Sep-1951, 63 y.o.   MRN: [id number]  HPI Mr. Zerbe fell off a ladder from working on the gutters at the second story level. He was blessed in that he had a soft landing, just missing being impaled on a stanchion or hitting concrete walk or plantar. He did go to the ED - notes, imaging reviewed. He dislocated his left shoulder. Two attempts by ED staff to reduce the dislocation failed. Dr. Alvan Dame was able to reduce the dislocation. This was a very painful event. He was d/c'd home.  Since being home he has kept his arm in a sling. He has not taken many of the narcotic pain pills he was given. He has good feeling in the left hand.  Past Medical History  Diagnosis Date  . Benign neoplasm of colon   . Allergic rhinitis, cause unspecified   . Osteoarthrosis, unspecified whether generalized or localized, unspecified site   . Osteoarthrosis, unspecified whether generalized or localized, lower leg   . Coronary artery disease     Cath 08/15/11 with 30% mid LAD, o/w no CAD  . Abdominal pain     below navel    . Hearing loss   . Inguinal hernia     right   Past Surgical History  Procedure Laterality Date  . Knee surgery    . Hand surgery    . Cardiac catheterization  2012  . Hernia repair  09/05/11    lap RIH w mesh   Family History  Problem Relation Age of Onset  . Diabetes Mother   . Heart disease Father   . Cancer Father     throat  . COPD Father     throat  . Colon cancer Neg Hx   . Prostate cancer Neg Hx    History   Social History  . Marital Status: Married    Spouse Name: N/A    Number of Children: N/A  . 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 Smoking status: Never Smoker   . Smokeless tobacco: Never Used  . Alcohol Use: No  . Drug Use: No  . Sexual Activity: Not on file   Other Topics Concern  . Not on file   Social History  Narrative   Graduate of [employer].  Married - '85.  1 son - '70,  2 daughters - '74, '87;   5 grandchildren.  Hobbies - keeps horses and ponies.  Marriage - good health             No current outpatient prescriptions on file prior to visit.   Current Facility-Administered Medications on File Prior to Visit  Medication Dose Route Frequency Provider Last Rate Last Dose  . albuterol (PROVENTIL) (2.5 MG/3ML) 0.083% nebulizer solution 2.5 mg  2.5 mg Nebulization Q6H PRN Midge Minium, MD          Review of Systems System review is negative for any constitutional, cardiac, pulmonary, GI or neuro symptoms or complaints other than as described in the HPI.     Objective:   Physical Exam Filed Vitals:   01/31/14 1624  BP: 122/80  Pulse: 72  Temp: 97.6 F (36.4 C)  Resp: 16   gen'l- WNWD man in no distress HEENT- no signs of trauma: no Battle's sign, no racoon eyes. Cor- 2+ radial pulse, regular Pulm -  normal respirations MSK - left arm in a sling. Did not manipulate       Assessment & Plan:  Dislocated shoulder - reduced in ED. If there is no torn rotator cuff or other ligamentous injury you should have a full recovery without limitation.  Plan See Dr. Alvan Dame for follow-up  For pain take 4 advil at a time (800 mg) three times a day. Watch for any stomach irritation.  Take tylenol 500 mg 2 tabs three times a day.  Call if this does not give adequate relief.

## 2014-02-28 NOTE — Telephone Encounter (Signed)
Error

## 2014-04-12 ENCOUNTER — Other Ambulatory Visit: Payer: Self-pay | Admitting: Orthopedic Surgery

## 2014-04-12 DIAGNOSIS — S43026A Posterior dislocation of unspecified humerus, initial encounter: Secondary | ICD-10-CM

## 2014-04-24 ENCOUNTER — Ambulatory Visit
Admission: RE | Admit: 2014-04-24 | Discharge: 2014-04-24 | Disposition: A | Discharge status: Home / Self Care | Payer: 59 | Source: Ambulatory Visit | Attending: Orthopedic Surgery | Admitting: Orthopedic Surgery

## 2014-04-24 DIAGNOSIS — S43026A Posterior dislocation of unspecified humerus, initial encounter: Secondary | ICD-10-CM

## 2014-04-24 MED ORDER — IOHEXOL 180 MG/ML  SOLN
20.0000 mL | Freq: Once | INTRAMUSCULAR | Status: AC | PRN
  Administered 2014-04-24: 20 mL via INTRA_ARTICULAR

## 2014-05-04 ENCOUNTER — Encounter (HOSPITAL_BASED_OUTPATIENT_CLINIC_OR_DEPARTMENT_OTHER): Payer: Self-pay | Admitting: *Deleted

## 2014-05-05 ENCOUNTER — Encounter (HOSPITAL_BASED_OUTPATIENT_CLINIC_OR_DEPARTMENT_OTHER): Payer: Self-pay | Admitting: *Deleted

## 2014-05-05 NOTE — Progress Notes (Signed)
SPOKE W/ PT WIFE.  NPO AFTER MN. ARRIVE AT 0745. NEEDS HG. MAY TAKE TYLENOL IF NEEDED W/ SIPS OF WATER.

## 2014-05-11 ENCOUNTER — Other Ambulatory Visit: Payer: Self-pay | Admitting: Orthopedic Surgery

## 2014-05-12 ENCOUNTER — Ambulatory Visit (HOSPITAL_BASED_OUTPATIENT_CLINIC_OR_DEPARTMENT_OTHER)
Admission: RE | Admit: 2014-05-12 | Discharge: 2014-05-12 | Disposition: A | Discharge status: Home / Self Care | Payer: 59 | Source: Ambulatory Visit | Attending: Specialist | Admitting: Specialist

## 2014-05-12 ENCOUNTER — Encounter (HOSPITAL_BASED_OUTPATIENT_CLINIC_OR_DEPARTMENT_OTHER): Payer: 59 | Admitting: Anesthesiology

## 2014-05-12 ENCOUNTER — Encounter (HOSPITAL_BASED_OUTPATIENT_CLINIC_OR_DEPARTMENT_OTHER)
Admission: RE | Disposition: A | Discharge status: Home / Self Care | Payer: Self-pay | Source: Ambulatory Visit | Attending: Specialist

## 2014-05-12 ENCOUNTER — Ambulatory Visit (HOSPITAL_BASED_OUTPATIENT_CLINIC_OR_DEPARTMENT_OTHER): Payer: 59 | Admitting: Anesthesiology

## 2014-05-12 ENCOUNTER — Encounter (HOSPITAL_BASED_OUTPATIENT_CLINIC_OR_DEPARTMENT_OTHER): Payer: Self-pay | Admitting: Anesthesiology

## 2014-05-12 DIAGNOSIS — M67919 Unspecified disorder of synovium and tendon, unspecified shoulder: Secondary | ICD-10-CM | POA: Insufficient documentation

## 2014-05-12 DIAGNOSIS — M24119 Other articular cartilage disorders, unspecified shoulder: Secondary | ICD-10-CM | POA: Insufficient documentation

## 2014-05-12 DIAGNOSIS — Z9889 Other specified postprocedural states: Secondary | ICD-10-CM

## 2014-05-12 DIAGNOSIS — I2510 Atherosclerotic heart disease of native coronary artery without angina pectoris: Secondary | ICD-10-CM | POA: Insufficient documentation

## 2014-05-12 DIAGNOSIS — M19019 Primary osteoarthritis, unspecified shoulder: Secondary | ICD-10-CM | POA: Insufficient documentation

## 2014-05-12 DIAGNOSIS — M719 Bursopathy, unspecified: Principal | ICD-10-CM | POA: Insufficient documentation

## 2014-05-12 DIAGNOSIS — M1710 Unilateral primary osteoarthritis, unspecified knee: Secondary | ICD-10-CM | POA: Insufficient documentation

## 2014-05-12 DIAGNOSIS — Z79899 Other long term (current) drug therapy: Secondary | ICD-10-CM | POA: Insufficient documentation

## 2014-05-12 HISTORY — DX: Personal history of colon polyps, unspecified: Z86.0100

## 2014-05-12 HISTORY — DX: Allergic rhinitis, unspecified: J30.9

## 2014-05-12 HISTORY — DX: Unspecified osteoarthritis, unspecified site: M19.90

## 2014-05-12 HISTORY — DX: Personal history of colonic polyps: Z86.010

## 2014-05-12 HISTORY — DX: Unspecified rotator cuff tear or rupture of left shoulder, not specified as traumatic: M75.102

## 2014-05-12 HISTORY — PX: SHOULDER ARTHROSCOPY WITH SUBACROMIAL DECOMPRESSION AND BICEP TENDON REPAIR: SHX5689

## 2014-05-12 LAB — POCT HEMOGLOBIN-HEMACUE: Hemoglobin: 15.2 g/dL (ref 13.0–17.0)

## 2014-05-12 SURGERY — SHOULDER ARTHROSCOPY WITH SUBACROMIAL DECOMPRESSION AND BICEP TENDON REPAIR
Anesthesia: General | Site: Shoulder | Laterality: Left

## 2014-05-12 MED ORDER — SODIUM CHLORIDE 0.9 % IJ SOLN
INTRAMUSCULAR | Status: DC | PRN
  Administered 2014-05-12: 10 mL via INTRAVENOUS

## 2014-05-12 MED ORDER — ONDANSETRON HCL 4 MG/2ML IJ SOLN
INTRAMUSCULAR | Status: DC | PRN
  Administered 2014-05-12: 4 mg via INTRAVENOUS

## 2014-05-12 MED ORDER — SODIUM CHLORIDE 0.9 % IR SOLN
Status: DC | PRN
  Administered 2014-05-12: 12000 mL

## 2014-05-12 MED ORDER — PROMETHAZINE HCL 25 MG/ML IJ SOLN
6.2500 mg | INTRAMUSCULAR | Status: DC | PRN
  Filled 2014-05-12: qty 1

## 2014-05-12 MED ORDER — SUCCINYLCHOLINE CHLORIDE 20 MG/ML IJ SOLN
INTRAMUSCULAR | Status: DC | PRN
  Administered 2014-05-12: 100 mg via INTRAVENOUS

## 2014-05-12 MED ORDER — FENTANYL CITRATE 0.05 MG/ML IJ SOLN
25.0000 ug | INTRAMUSCULAR | Status: DC | PRN
  Filled 2014-05-12: qty 1

## 2014-05-12 MED ORDER — SODIUM CHLORIDE 0.9 % IV SOLN
INTRAVENOUS | Status: DC
  Filled 2014-05-12: qty 1000

## 2014-05-12 MED ORDER — DEXAMETHASONE SODIUM PHOSPHATE 4 MG/ML IJ SOLN
INTRAMUSCULAR | Status: DC | PRN
  Administered 2014-05-12: 10 mg via INTRAVENOUS

## 2014-05-12 MED ORDER — FENTANYL CITRATE 0.05 MG/ML IJ SOLN
100.0000 ug | Freq: Once | INTRAMUSCULAR | Status: AC
  Administered 2014-05-12: 100 ug via INTRAVENOUS
  Filled 2014-05-12: qty 2

## 2014-05-12 MED ORDER — ROPIVACAINE HCL 5 MG/ML IJ SOLN
INTRAMUSCULAR | Status: DC | PRN
  Administered 2014-05-12: 30 mL via PERINEURAL

## 2014-05-12 MED ORDER — PROPOFOL 10 MG/ML IV BOLUS
INTRAVENOUS | Status: DC | PRN
  Administered 2014-05-12: 160 mg via INTRAVENOUS

## 2014-05-12 MED ORDER — CEPHALEXIN 500 MG PO CAPS: 500.0000 mg | ORAL_CAPSULE | Freq: Three times a day (TID) | ORAL | Status: DC

## 2014-05-12 MED ORDER — GLYCOPYRROLATE 0.2 MG/ML IJ SOLN
INTRAMUSCULAR | Status: DC | PRN
  Administered 2014-05-12: 0.2 mg via INTRAVENOUS

## 2014-05-12 MED ORDER — CEFAZOLIN SODIUM-DEXTROSE 2-3 GM-% IV SOLR
2.0000 g | INTRAVENOUS | Status: AC
  Administered 2014-05-12: 2 g via INTRAVENOUS
  Filled 2014-05-12: qty 50

## 2014-05-12 MED ORDER — FENTANYL CITRATE 0.05 MG/ML IJ SOLN
INTRAMUSCULAR | Status: AC
  Filled 2014-05-12: qty 4

## 2014-05-12 MED ORDER — MIDAZOLAM HCL 2 MG/2ML IJ SOLN
INTRAMUSCULAR | Status: AC
  Filled 2014-05-12: qty 2

## 2014-05-12 MED ORDER — FENTANYL CITRATE 0.05 MG/ML IJ SOLN
INTRAMUSCULAR | Status: AC
  Filled 2014-05-12: qty 2

## 2014-05-12 MED ORDER — KETOROLAC TROMETHAMINE 30 MG/ML IJ SOLN
INTRAMUSCULAR | Status: DC | PRN
  Administered 2014-05-12: 30 mg via INTRAVENOUS

## 2014-05-12 MED ORDER — FENTANYL CITRATE 0.05 MG/ML IJ SOLN
INTRAMUSCULAR | Status: DC | PRN
  Administered 2014-05-12: 50 ug via INTRAVENOUS
  Administered 2014-05-12 (×2): 25 ug via INTRAVENOUS

## 2014-05-12 MED ORDER — OXYCODONE-ACETAMINOPHEN 5-325 MG PO TABS: 1.0000 | ORAL_TABLET | ORAL | Status: DC | PRN

## 2014-05-12 MED ORDER — BUPIVACAINE HCL (PF) 0.25 % IJ SOLN
INTRAMUSCULAR | Status: DC | PRN
  Administered 2014-05-12: 20 mL

## 2014-05-12 MED ORDER — MIDAZOLAM HCL 5 MG/5ML IJ SOLN
INTRAMUSCULAR | Status: DC | PRN
  Administered 2014-05-12: 1 mg via INTRAVENOUS

## 2014-05-12 MED ORDER — ACETAMINOPHEN 10 MG/ML IV SOLN
INTRAVENOUS | Status: DC | PRN
  Administered 2014-05-12: 1000 mg via INTRAVENOUS

## 2014-05-12 MED ORDER — METHOCARBAMOL 500 MG PO TABS: 500.0000 mg | ORAL_TABLET | Freq: Four times a day (QID) | ORAL | Status: DC | PRN

## 2014-05-12 MED ORDER — LIDOCAINE HCL (CARDIAC) 20 MG/ML IV SOLN
INTRAVENOUS | Status: DC | PRN
  Administered 2014-05-12: 50 mg via INTRAVENOUS

## 2014-05-12 MED ORDER — EPINEPHRINE HCL 1 MG/ML IJ SOLN
INTRAMUSCULAR | Status: DC | PRN
  Administered 2014-05-12: 2 mg

## 2014-05-12 MED ORDER — POVIDONE-IODINE 7.5 % EX SOLN
Freq: Once | CUTANEOUS | Status: DC
  Filled 2014-05-12: qty 118

## 2014-05-12 MED ORDER — MIDAZOLAM HCL 2 MG/2ML IJ SOLN
2.0000 mg | Freq: Once | INTRAMUSCULAR | Status: AC
Start: 2014-05-12 — End: 2014-05-12
  Administered 2014-05-12: 2 mg via INTRAVENOUS
  Filled 2014-05-12: qty 2

## 2014-05-12 MED ORDER — LACTATED RINGERS IV SOLN
INTRAVENOUS | Status: DC
  Administered 2014-05-12 (×2): via INTRAVENOUS
  Filled 2014-05-12: qty 1000

## 2014-05-12 SURGICAL SUPPLY — 91 items
ANCH SUT SWLK 19.1 CLS EYLT TL (Anchor) ×1 IMPLANT
ANCH SUT SWLK 19.1 CLS EYLT VT (Anchor) ×1 IMPLANT
ANCH SUT SWLK 19.1X4.75 (Anchor) ×2 IMPLANT
ANCHOR BIO SWLOCK 4.75 W/TIG (Anchor) ×1 IMPLANT
ANCHOR SUT BIO SW 4.75 W/FIB (Anchor) ×1 IMPLANT
ANCHOR SUT BIO SW 4.75X19.1 (Anchor) ×2 IMPLANT
BLADE CUDA 4.2 (BLADE) IMPLANT
BLADE CUDA GRT WHITE 3.5 (BLADE) ×2 IMPLANT
BLADE CUTTER GATOR 3.5 (BLADE) IMPLANT
BLADE GREAT WHITE 4.2 (BLADE) ×2 IMPLANT
BLADE SURG 11 STRL SS (BLADE) ×2 IMPLANT
BLADE SURG 15 STRL LF DISP TIS (BLADE) ×1 IMPLANT
BLADE SURG 15 STRL SS (BLADE) ×2
BUR 3.5 LG SPHERICAL (BURR) IMPLANT
BUR OVAL 6.0 (BURR) ×2 IMPLANT
BURR 3.5 LG SPHERICAL (BURR) ×2
CANISTER SUCT LVC 12 LTR MEDI- (MISCELLANEOUS) ×7 IMPLANT
CANISTER SUCTION 2500CC (MISCELLANEOUS) IMPLANT
CANNULA 5.75X7 CRYSTAL CLEAR (CANNULA) ×2 IMPLANT
CANNULA 5.75X71 LONG (CANNULA) IMPLANT
CANNULA TWIST IN 8.25X7CM (CANNULA) ×5 IMPLANT
CLOTH BEACON ORANGE TIMEOUT ST (SAFETY) ×2 IMPLANT
COVER MAYO STAND STRL (DRAPES) ×2 IMPLANT
COVER TABLE BACK 60X90 (DRAPES) ×2 IMPLANT
DRAPE LG THREE QUARTER DISP (DRAPES) ×2 IMPLANT
DRAPE ORTHO SPLIT 77X108 STRL (DRAPES) ×4
DRAPE POUCH INSTRU U-SHP 10X18 (DRAPES) ×2 IMPLANT
DRAPE STERI 35X30 U-POUCH (DRAPES) ×2 IMPLANT
DRAPE SURG 17X23 STRL (DRAPES) ×2 IMPLANT
DRAPE SURG ORHT 6 SPLT 77X108 (DRAPES) ×2 IMPLANT
DRAPE U-SHAPE 47X51 STRL (DRAPES) ×2 IMPLANT
DRSG PAD ABDOMINAL 8X10 ST (GAUZE/BANDAGES/DRESSINGS) ×1 IMPLANT
DURAPREP 26ML APPLICATOR (WOUND CARE) ×2 IMPLANT
ELECT MENISCUS 165MM 90D (ELECTRODE) IMPLANT
ELECT REM PT RETURN 9FT ADLT (ELECTROSURGICAL) ×2
ELECTRODE REM PT RTRN 9FT ADLT (ELECTROSURGICAL) ×1 IMPLANT
FIBERSTICK 2 (SUTURE) IMPLANT
GAUZE XEROFORM 1X8 LF (GAUZE/BANDAGES/DRESSINGS) ×2 IMPLANT
GLOVE BIO SURGEON STRL SZ7.5 (GLOVE) ×2 IMPLANT
GLOVE BIOGEL PI IND STRL 6.5 (GLOVE) IMPLANT
GLOVE BIOGEL PI IND STRL 7.5 (GLOVE) IMPLANT
GLOVE BIOGEL PI INDICATOR 6.5 (GLOVE) ×1
GLOVE BIOGEL PI INDICATOR 7.5 (GLOVE) ×1
GLOVE ECLIPSE 7.0 STRL STRAW (GLOVE) ×1 IMPLANT
GLOVE INDICATOR 8.0 STRL GRN (GLOVE) ×4 IMPLANT
GLOVE SURG ORTHO 8.0 STRL STRW (GLOVE) ×2 IMPLANT
GOWN PREVENTION PLUS LG XLONG (DISPOSABLE) ×2 IMPLANT
GOWN STRL REIN XL XLG (GOWN DISPOSABLE) ×2 IMPLANT
GOWN STRL REUS W/ TWL LRG LVL3 (GOWN DISPOSABLE) IMPLANT
GOWN STRL REUS W/ TWL XL LVL3 (GOWN DISPOSABLE) IMPLANT
GOWN STRL REUS W/TWL LRG LVL3 (GOWN DISPOSABLE) ×2
GOWN STRL REUS W/TWL XL LVL3 (GOWN DISPOSABLE) ×4
IV NS IRRIG 3000ML ARTHROMATIC (IV SOLUTION) ×14 IMPLANT
KIT SHOULDER TRACTION (DRAPES) ×2 IMPLANT
LASSO SUT 90 DEGREE (SUTURE) IMPLANT
NDL 1/2 CIR CATGUT .05X1.09 (NEEDLE) IMPLANT
NDL SCORPION MULTI FIRE (NEEDLE) IMPLANT
NDL SPNL 18GX3.5 QUINCKE PK (NEEDLE) ×1 IMPLANT
NEEDLE 1/2 CIR CATGUT .05X1.09 (NEEDLE) IMPLANT
NEEDLE HYPO 22GX1.5 SAFETY (NEEDLE) ×2 IMPLANT
NEEDLE SCORPION MULTI FIRE (NEEDLE) ×2 IMPLANT
NEEDLE SPNL 18GX3.5 QUINCKE PK (NEEDLE) ×2 IMPLANT
NS IRRIG 500ML POUR BTL (IV SOLUTION) IMPLANT
PACK ARTHROSCOPY DSU (CUSTOM PROCEDURE TRAY) ×1 IMPLANT
PACK BASIN DAY SURGERY FS (CUSTOM PROCEDURE TRAY) ×2 IMPLANT
PAD ABD 8X10 STRL (GAUZE/BANDAGES/DRESSINGS) ×4 IMPLANT
PENCIL BUTTON HOLSTER BLD 10FT (ELECTRODE) IMPLANT
SET ARTHROSCOPY TUBING (MISCELLANEOUS) ×2
SET ARTHROSCOPY TUBING PVC (MISCELLANEOUS) ×1 IMPLANT
SLING ULTRA II AB L (ORTHOPEDIC SUPPLIES) IMPLANT
SLING ULTRA II AB S (ORTHOPEDIC SUPPLIES) IMPLANT
SPONGE GAUZE 4X4 12PLY (GAUZE/BANDAGES/DRESSINGS) ×2 IMPLANT
SPONGE GAUZE 4X4 12PLY STER LF (GAUZE/BANDAGES/DRESSINGS) ×1 IMPLANT
SPONGE LAP 4X18 X RAY DECT (DISPOSABLE) IMPLANT
SUCTION FRAZIER TIP 10 FR DISP (SUCTIONS) IMPLANT
SUT ETHILON 3 0 PS 1 (SUTURE) ×2 IMPLANT
SUT LASSO 45 DEGREE LEFT (SUTURE) IMPLANT
SUT LASSO 45D RIGHT (SUTURE) IMPLANT
SUT PDS AB 0 CT1 27 (SUTURE) ×2 IMPLANT
SUT VIC AB 0 CT1 36 (SUTURE) IMPLANT
SUT VIC AB 2-0 CT1 27 (SUTURE)
SUT VIC AB 2-0 CT1 TAPERPNT 27 (SUTURE) IMPLANT
SYR 20CC LL (SYRINGE) ×2 IMPLANT
SYR CONTROL 10ML LL (SYRINGE) ×1 IMPLANT
SYR TB 1ML 27GX1/2 SAFE (SYRINGE) ×1 IMPLANT
SYR TB 1ML 27GX1/2 SAFETY (SYRINGE) ×2
TOWEL OR 17X24 6PK STRL BLUE (TOWEL DISPOSABLE) ×2 IMPLANT
TUBE CONNECTING 12X1/4 (SUCTIONS) ×4 IMPLANT
WAND 90 DEG TURBOVAC W/CORD (SURGICAL WAND) ×2 IMPLANT
WATER STERILE IRR 500ML POUR (IV SOLUTION) ×2 IMPLANT
YANKAUER SUCT BULB TIP NO VENT (SUCTIONS) ×1 IMPLANT

## 2014-05-12 NOTE — Transfer of Care (Signed)
Immediate Anesthesia Transfer of Care Note  Patient: Bradley Gilbert  Procedure(s) Performed: Procedure(s) (LRB): LEFT ARTHROSCOPY SHOULDER WITH EXAM UNDER ANESTHESIA,SUBACROMIAL DECOMPRESSION DISTAL CLAVICAL RESECTION LABIAL REPAIR VERSES DEBRIDEMENT (Left)  Patient Location: PACU  Anesthesia Type: General  Level of Consciousness: awake, sedated, patient cooperative and responds to stimulation  Airway & Oxygen Therapy: Patient Spontanous Breathing and Patient connected to face mask oxygen  Post-op Assessment: Report given to PACU RN, Post -op Vital signs reviewed and stable and Patient moving all extremities  Post vital signs: Reviewed and stable  Complications: No apparent anesthesia complications

## 2014-05-12 NOTE — Anesthesia Postprocedure Evaluation (Signed)
  Anesthesia Post-op Note  Patient: Bradley Gilbert  Procedure(s) Performed: Procedure(s) (LRB): LEFT ARTHROSCOPY SHOULDER WITH EXAM UNDER ANESTHESIA,SUBACROMIAL DECOMPRESSION DISTAL CLAVICAL RESECTION LABIAL REPAIR VERSES DEBRIDEMENT (Left)  Patient Location: PACU  Anesthesia Type: GA combined with regional for post-op pain  Level of Consciousness: awake and alert   Airway and Oxygen Therapy: Patient Spontanous Breathing  Post-op Pain: mild  Post-op Assessment: Post-op Vital signs reviewed, Patient's Cardiovascular Status Stable, Respiratory Function Stable, Patent Airway and No signs of Nausea or vomiting  Last Vitals:  Filed Vitals:   05/12/14 1305  BP: 118/75  Pulse: 75  Temp: 36 C  Resp: 18    Post-op Vital Signs: stable   Complications: No apparent anesthesia complications

## 2014-05-12 NOTE — Interval H&P Note (Signed)
History and Physical Interval Note:  05/12/2014 9:05 AM  Bradley Gilbert  has presented today for surgery, with the diagnosis of LEFT SHOULDER OSTEOARTHRITIS  ROTATOR CUFF TEAR  The various methods of treatment have been discussed with the patient and family. After consideration of risks, benefits and other options for treatment, the patient has consented to  Procedure(s): LEFT ARTHROSCOPY SHOULDER WITH EXAM UNDER ANESTHESIA,SUBACROMIAL DECOMPRESSION DISTAL CLAVICAL RESECTION LABIAL REPAIR VERSES DEBRIDEMENT (Left) as a surgical intervention .  The patient's history has been reviewed, patient examined, no change in status, stable for surgery.  I have reviewed the patient's chart and labs.  Questions were answered to the patient's satisfaction.     COLLINS,ROBERT ANDREW

## 2014-05-12 NOTE — Anesthesia Procedure Notes (Addendum)
Anesthesia Regional Block:  Interscalene brachial plexus block  Pre-Anesthetic Checklist: ,, timeout performed, Correct Patient, Correct Site, Correct Laterality, Correct Procedure, Correct Position, site marked, Risks and benefits discussed,  Surgical consent,  Pre-op evaluation,  At surgeon's request and post-op pain management  Laterality: Left and Upper  Prep: chloraprep       Needles:  Injection technique: Single-shot  Needle Type: Stimiplex      Needle Gauge: 21 and 21 G    Additional Needles:  Procedures: ultrasound guided (picture in chart) and nerve stimulator Interscalene brachial plexus block  Nerve Stimulator or Paresthesia:  Response: biceps, 0.6 mA,   Additional Responses:   Narrative:   Performed by: Personally  Anesthesiologist: denenny  Additional Notes:  No pain on injection. No increased resistance to injection.  Motor intact immediately after block. Loss of deltoid function at 20 minutes.     Procedure Name: Intubation Date/Time: 05/12/2014 9:38 AM Performed by: Mechele Claude Pre-anesthesia Checklist: Patient identified, Emergency Drugs available, Suction available and Patient being monitored Patient Re-evaluated:Patient Re-evaluated prior to inductionOxygen Delivery Method: Circle System Utilized Preoxygenation: Pre-oxygenation with 100% oxygen Intubation Type: IV induction Ventilation: Mask ventilation without difficulty Laryngoscope Size: Mac and 4 Tube type: Oral Tube size: 8.0 mm Number of attempts: 2 Airway Equipment and Method: stylet Placement Confirmation: ETT inserted through vocal cords under direct vision,  positive ETCO2 and breath sounds checked- equal and bilateral Secured at: 22 (at teeth) cm Tube secured with: Tape Dental Injury: Teeth and Oropharynx as per pre-operative assessment  Comments: Unable to visulaize cords on first DL attempt.  Repositioned head, DL number 2, GR1 view of cords. AOI. +ETCO2. =BBS.

## 2014-05-12 NOTE — H&P (Signed)
Bradley Gilbert is an 63 y.o. male.   Chief Complaint: Left shoulder pain HPI: Patient presents with joint discomfort that had been persistent for several months now. Despite conservative treatments, his discomfort has not improved. Imaging was obtained. Other conservative and surgical treatments were discussed in detail. Patient wishes to proceed with surgery as consented. Denies SOB, CP, or calf pain. No Fever, chills, or nausea/ vomiting.   Past Medical History  Diagnosis Date  . Hearing loss   . OA (osteoarthritis)     KNEES AND LEFT SHOULDER  . Left rotator cuff tear   . Allergic rhinitis   . History of colon polyps     BENGIN  . Coronary artery disease     Cath 08/15/11 with 30% mid LAD, o/w no CAD    Past Surgical History  Procedure Laterality Date  . Laparoscopic inguinal hernia repair Right 09-05-2011  . 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 Transthoracic echocardiogram  08-19-2011    MILD LVH/  EF 60-45%/  GRADE I DIASTOLIC DYSFUNCTION  . Knee arthroscopy  YRS AGO    Family History  Problem Relation Age of Onset  . Diabetes Mother   . Heart disease Father   . Cancer Father     throat  . COPD Father     throat  . Colon cancer Neg Hx   . Prostate cancer Neg Hx    Social History:  reports that he has never smoked. He has never used smokeless tobacco. He reports that he does not drink alcohol or use illicit drugs.  Allergies:  Allergies  Allergen Reactions  . Shellfish Allergy Swelling    ALL TYPES --- Eyes swell , severe headache    Medications Prior to Admission  Medication Sig Dispense Refill  . acetaminophen (TYLENOL) 500 MG tablet Take 500 mg by mouth every 6 (six) hours as needed.      . Multiple Vitamin (MULTIVITAMIN) tablet Take 1 tablet by mouth daily.      . Naproxen Sodium  (ALEVE) 220 MG CAPS Take by mouth as needed.        No results found for this or any previous visit (from the past 48 hour(s)). No results found.  Review of Systems  Constitutional: Negative.   HENT: Negative.   Eyes: Negative.   Respiratory: Negative.   Cardiovascular: Negative.   Gastrointestinal: Negative.   Genitourinary: Negative.   Musculoskeletal: Positive for joint pain.  Skin: Negative.   Neurological: Negative.   Endo/Heme/Allergies: Negative.   Psychiatric/Behavioral: Negative.     Blood pressure 146/92, pulse 62, temperature 97.7 F (36.5 C), temperature source Oral, resp. rate 16, height 6' (1.829 m), weight 88.225 kg (194 lb 8 oz), SpO2 100.00%. Physical Exam  Constitutional: He is oriented to person, place, and time. He appears well-developed.  HENT:  Head: Normocephalic.  Eyes: EOM are normal.  Neck: Normal range of motion.  Cardiovascular: Normal rate, regular rhythm, normal heart sounds and intact distal pulses.   Respiratory: Effort normal and breath sounds normal.  GI: Soft. Bowel sounds are normal.  Genitourinary:  Deferred  Musculoskeletal: He exhibits edema and tenderness.  Neurological: He is alert and oriented to person, place, and time.  Skin: Skin is warm and dry.  Psychiatric: His behavior is normal.  Assessment/Plan Left shoulder pain: Scope with SED, DCR, possible RCR: D/c home today F/u  In office Take meds as directed Follow instructions  STILWELL, BRYSON L 05/12/2014, 7:52 AM

## 2014-05-12 NOTE — Op Note (Signed)
Dictated# [id number]

## 2014-05-12 NOTE — Anesthesia Preprocedure Evaluation (Addendum)
Anesthesia Evaluation  Patient identified by MRN, date of birth, ID band Patient awake    Reviewed: Allergy & Precautions, H&P , NPO status , Patient's Chart, lab work & pertinent test results  Airway Mallampati: II TM Distance: >3 FB Neck ROM: Full    Dental  (+) Dental Advisory Given Recently had dental work on lower front teeth. Stitched recently removed. Front lower teeth now glued together.:   Pulmonary shortness of breath,  breath sounds clear to auscultation  Pulmonary exam normal       Cardiovascular + CAD Rhythm:Regular Rate:Normal     Neuro/Psych negative neurological ROS  negative psych ROS   GI/Hepatic negative GI ROS, Neg liver ROS,   Endo/Other  negative endocrine ROS  Renal/GU negative Renal ROS  negative genitourinary   Musculoskeletal negative musculoskeletal ROS (+)   Abdominal   Peds negative pediatric ROS (+)  Hematology negative hematology ROS (+)   Anesthesia Other Findings   Reproductive/Obstetrics negative OB ROS                          Anesthesia Physical Anesthesia Plan  ASA: III  Anesthesia Plan: General   Post-op Pain Management: MAC Combined w/ Regional for Post-op pain   Induction: Intravenous  Airway Management Planned: Oral ETT  Additional Equipment:   Intra-op Plan:   Post-operative Plan: Extubation in OR  Informed Consent: I have reviewed the patients History and Physical, chart, labs and discussed the procedure including the risks, benefits and alternatives for the proposed anesthesia with the patient or authorized representative who has indicated his/her understanding and acceptance.   Dental advisory given  Plan Discussed with: CRNA  Anesthesia Plan Comments: (Discussed risks and benefits of interscalene block including failure, bleeding, infection, nerve damage, weakness. Questions answered. Patient consents to block.)        Anesthesia Quick Evaluation

## 2014-05-12 NOTE — Discharge Instructions (Signed)
Regional Anesthesia Blocks ° °1. Numbness or the inability to move the "blocked" extremity may last from 3-48 hours after placement. The length of time depends on the medication injected and your individual response to the medication. If the numbness is not going away after 48 hours, call your surgeon. ° °2. The extremity that is blocked will need to be protected until the numbness is gone and the  Strength has returned. Because you cannot feel it, you will need to take extra care to avoid injury. Because it may be weak, you may have difficulty moving it or using it. You may not know what position it is in without looking at it while the block is in effect. ° °3. For blocks in the legs and feet, returning to weight bearing and walking needs to be done carefully. You will need to wait until the numbness is entirely gone and the strength has returned. You should be able to move your leg and foot normally before you try and bear weight or walk. You will need someone to be with you when you first try to ensure you do not fall and possibly risk injury. ° °4. Bruising and tenderness at the needle site are common side effects and will resolve in a few days. ° °5. Persistent numbness or new problems with movement should be communicated to the surgeon or the [facility] ([phone])/ [facility] ([phone]). ° ° ° °Post Anesthesia Home Care Instructions ° °Activity: °Get plenty of rest for the remainder of the day. A responsible adult should stay with you for 24 hours following the procedure.  °For the next 24 hours, DO NOT: °-Drive a car °-Operate machinery °-Drink alcoholic beverages °-Take any medication unless instructed by your physician °-Make any legal decisions or sign important papers. ° °Meals: °Start with liquid foods such as gelatin or soup. Progress to regular foods as tolerated. Avoid greasy, spicy, heavy foods. If nausea and/or vomiting occur, drink only clear liquids until the  nausea and/or vomiting subsides. Call your physician if vomiting continues. ° °Special Instructions/Symptoms: °Your throat may feel dry or sore from the anesthesia or the breathing tube placed in your throat during surgery. If this causes discomfort, gargle with warm salt water. The discomfort should disappear within 24 hours. ° °

## 2014-05-15 NOTE — Op Note (Signed)
NAME:  Bradley Gilbert, FERRELL NO.:  [id number]  MEDICAL RECORD NO.:  [id number]  LOCATION:                                 FACILITY:  PHYSICIAN:  Cynda Familia, M.D.DATE OF BIRTH:  31-Jul-1951  DATE OF PROCEDURE:  05/12/2014 DATE OF DISCHARGE:                              OPERATIVE REPORT   PREOPERATIVE DIAGNOSIS:  Status post left shoulder dislocation with labral tear, rotator cuff tear, acromioclavicular arthritis.  POSTOPERATIVE DIAGNOSES: 1. Left shoulder extensive labral tearing. 2. 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 3. Large retracted rotator cuff tear with rotator cuff tendinopathy. 4. Acromioclavicular arthritis.  PROCEDURE: 1. Left shoulder glenohumeral arthroscopy with labral debridement. 2. Arthroscopic subacromial decompression with acromioplasty,     bursectomy, and coracoacromial ligament release. 3. Arthroscopic distal clavicle resection, Mumford procedure. 4. Arthroscopic rotator cuff mobilization and repair.  SURGEON:  Cynda Familia, M.D.  ASSISTANT:  Wyatt Portela, PA-C  ANESTHESIA:  Interscalene block, general.  ESTIMATED BLOOD LOSS:  Less than 20 mL.  DRAINS:  None.  COMPLICATIONS:  None.  DISPOSITION:  PACU, stable.  OPERATIVE DETAILS:  The patient was counseled in the holding area. Correct site was identified.  IV was started.  Sedation was given. Block was administered.  IV Ancef was given on the way to the operating room __________.  In the operating room, placed in supine position under general anesthesia.  PAS stockings were applied for DVT prophylaxis.  He was turned into a right lateral decubitus position, properly padded and bumped, and the left shoulder was examined with gentle full range of motion except for mild lack of external rotation with the arm in 40 degrees of abduction.  He was then prepped with DuraPrep and draped in usual sterile fashion.  We  utilized open-end shoulder positioner with 30 degrees of abduction, 10 degrees of forward flexion, __________ 10 pound longitudinal traction to make sure there was no tension on the neurovascular structures.  Neural structures were protected throughout the entire case including the axillary nerve.  Prepped with DuraPrep and again draped in sterile fashion as above.  Arthroscopic portals were established.  Posterior cannula was introduced after time-out.  __________ there was a large effusion.  Scope was placed.  There was unfortunately grade 4 chondromalacia on the humeral head and glenoid in certain areas and also marked chondromalacia and there was also diffuse labral tearing.  Anterior portal was made __________ technique through rotator cuff interval __________ introduced.  __________ appeared to be intact. Labrum was debrided with motorized shaver back to smooth edges.  There was no true Bankart lesion anteriorly.  There was no evidence of instability as he has not had any separate __________ dislocation. There was a very large rotator cuff involving both supra and infraspinatus.  Following the labral debridement, attention was direct to __________ lateral portal was established, subacromial bursectomy was performed. Neurovascular structures were protected including the axillary nerves. Following this, the rotator cuff was then debrided back to smooth edges. There was noted to be marked rotator cuff tendinopathy, it was immobilized both intra-articular and extra-articular __________ cuff grabber and taken back to the greater tuberosity.  At  this point in time, ArthroCare assisted __________.  Bur was then placed posteriorly and anterior-inferior acromioplasty was performed converting to flat acromial morphology and also lateral acromioplasty.  AC joint had been markedly osteoarthritic.  Bur was placed from anterior, lateral 5-8 mm of clavicle was removed circumferentially.   Capsule was left intact. Clavicle was palpated, found to be stable, and debris was removed. Hemostasis was obtained.  The greater tuberosity __________ bleeding bone with a bur nicely.  A small puncture was made superiorly and Arthrex Bio-composite swivel lock anchor was placed at appropriate level.  Four sutures were then placed into the supraspinous, 2 FiberTapes and 2 FiberWires.  Another anchor was then placed more posteriorly for that.  Four more sutures were placed in the infraspinatus, 2 FiberWires and 2 FiberTapes.  At this point in time, the arm was then abducted and the anterior 4 sutures were placed with swivel lock anchor __________ tabbed the screw into position, __________ supraspinous nicely over the prepared bone and then the arm was rotated and another anchor was then placed posteriorly and the infraspinatus was brought over nicely.  At this point in time, we had reapproximated both tendons back to greater tuberosity repair site.  There was no excess tension on the suture line.  It was repaired as well as possible. __________ there was noted to be marked rotator cuff tendinopathy. Irrigated and arthroscopic equipments removed, taken out of traction, placed in shoulder abduction sling.  Portal was closed with 4-0 nylon suture.  Another 10 mL of Sensorcaine placed into the shoulder joint, sterile dressing, and then at that point in time, he was then placed to abduction sling.  He was turned supine, awakened, taken from the operating room to PACU in stable condition.  There were no complications.  He tolerated the procedure well.  He will be transported from the operating room to the PACU in stable condition.  To help with the patient positioning, prepping, draping, technical, and surgical assistance throughout the entire case, wound closure, application of dressing and sling, Mr. Wyatt Portela, PA-C's assistance was needed throughout the entire case.           ______________________________ Cynda Familia, M.D.    RAC/MEDQ  D:  05/12/2014  T:  05/12/2014  Job:  [id number]

## 2014-05-17 ENCOUNTER — Encounter (HOSPITAL_BASED_OUTPATIENT_CLINIC_OR_DEPARTMENT_OTHER): Payer: Self-pay | Admitting: Specialist

## 2014-07-24 ENCOUNTER — Encounter: Payer: Self-pay | Admitting: Gastroenterology

## 2014-08-08 ENCOUNTER — Encounter: Payer: Self-pay | Admitting: Gastroenterology

## 2014-09-12 ENCOUNTER — Ambulatory Visit (INDEPENDENT_AMBULATORY_CARE_PROVIDER_SITE_OTHER): Payer: 59 | Admitting: Internal Medicine

## 2014-09-12 ENCOUNTER — Encounter: Payer: Self-pay | Admitting: Internal Medicine

## 2014-09-12 VITALS — BP 148/90 | HR 64 | Temp 97.8°F | Resp 12 | Ht 70.0 in | Wt 207.8 lb

## 2014-09-12 DIAGNOSIS — Z418 Encounter for other procedures for purposes other than remedying health state: Secondary | ICD-10-CM

## 2014-09-12 DIAGNOSIS — Z0000 Encounter for general adult medical examination without abnormal findings: Secondary | ICD-10-CM | POA: Insufficient documentation

## 2014-09-12 DIAGNOSIS — E785 Hyperlipidemia, unspecified: Secondary | ICD-10-CM

## 2014-09-12 DIAGNOSIS — Z23 Encounter for immunization: Secondary | ICD-10-CM

## 2014-09-12 DIAGNOSIS — I2510 Atherosclerotic heart disease of native coronary artery without angina pectoris: Secondary | ICD-10-CM

## 2014-09-12 DIAGNOSIS — Z299 Encounter for prophylactic measures, unspecified: Secondary | ICD-10-CM

## 2014-09-12 DIAGNOSIS — R030 Elevated blood-pressure reading, without diagnosis of hypertension: Secondary | ICD-10-CM

## 2014-09-12 NOTE — Assessment & Plan Note (Signed)
Recent lipid panel normal, continue to monitor every 5 years.

## 2014-09-12 NOTE — Progress Notes (Signed)
Pre visit review using our clinic review tool, if applicable. No additional management support is needed unless otherwise documented below in the visit note. 

## 2014-09-12 NOTE — Assessment & Plan Note (Signed)
He'll continue to work on exercise, not currently taking aspirin at this time.

## 2014-09-12 NOTE — Progress Notes (Signed)
   Subjective:    Patient ID: Bradley Gilbert, male    DOB: 1951/06/11, 63 y.o.   MRN: [id number]  HPI The patient is a 9 for old man who is coming in to establish care today. He has past medical history of CAD, recent shoulder surgery. He is recovering well from surgery and has finished PT about 2 weeks ago however he is still not cleared for full activity. This is depressed and as he is not able to get back to activities he enjoys. He denies chest pains, shortness breath, abdominal pain, diarrhea, constipation. IHe is upset as his blood pressure has been a little elevated at today's visit and he feels this is because he's gained weight due to being unable to exercise with his shoulder.  Review of Systems  Constitutional: Positive for activity change. Negative for appetite change, fatigue and unexpected weight change.  HENT: Negative.   Eyes: Negative.   Respiratory: Negative for cough, chest tightness, shortness of breath and wheezing.   Cardiovascular: Negative for chest pain, palpitations and leg swelling.  Gastrointestinal: Negative for abdominal pain, diarrhea, constipation and abdominal distention.  Musculoskeletal: Positive for arthralgias. Negative for back pain, gait problem and myalgias.  Skin: Negative.   Neurological: Negative.       Objective:   Physical Exam  Constitutional: He is oriented to person, place, and time. He appears well-developed and well-nourished. No distress.  Overweight  HENT:  Head: Normocephalic and atraumatic.  Eyes: EOM are normal.  Neck: Normal range of motion.  Cardiovascular: Normal rate and regular rhythm.   Pulmonary/Chest: Breath sounds normal. No respiratory distress. He has no wheezes. He has no rales.  Abdominal: Soft. Bowel sounds are normal. He exhibits no distension. There is no tenderness. There is no rebound.  Musculoskeletal:  Limited ROM in left shoulder.  Neurological: He is alert and oriented to person, place, and time.  Coordination normal.  Skin: Skin is warm and dry.   Filed Vitals:   09/12/14 0929 09/12/14 1005  BP: 158/102 148/90  Pulse: 64   Temp: 97.8 F (36.6 C)   TempSrc: Oral   Resp: 12   Height: 5\' 10"  (1.778 m)   Weight: 207 lb 12.8 oz (94.257 kg)   SpO2: 97%       Assessment & Plan:  Shingles shot given today.

## 2014-09-12 NOTE — Assessment & Plan Note (Signed)
Last colonoscopy 2009, given shingles shot today. He will return in 2-3 weeks for flu shot and pneumonia shot as well as blood work including basic metabolic panel lipid panel.

## 2014-09-12 NOTE — Patient Instructions (Addendum)
We will give you the shingles shot today and have you come back in about 2 weeks for the flu and pneumonia shot and to get your blood work done.   We will watch your blood pressure the next time you come in. Come back in about 6 months so we can check the blood pressure and see how you are doing with weight loss.   Work on exercising more to get back in shape.

## 2014-09-27 ENCOUNTER — Ambulatory Visit (AMBULATORY_SURGERY_CENTER): Payer: 59 | Admitting: *Deleted

## 2014-09-27 VITALS — Ht 70.0 in | Wt 207.4 lb

## 2014-09-27 DIAGNOSIS — Z86010 Personal history of colonic polyps: Secondary | ICD-10-CM

## 2014-09-27 MED ORDER — MOVIPREP 100 G PO SOLR: ORAL | Status: DC

## 2014-09-27 NOTE — Progress Notes (Signed)
No allergies to eggs or soy. No problems with anesthesia.  Pt given Emmi instructions for colonoscopy  No oxygen use  No diet drug use  

## 2014-09-29 ENCOUNTER — Other Ambulatory Visit (INDEPENDENT_AMBULATORY_CARE_PROVIDER_SITE_OTHER): Payer: 59

## 2014-09-29 ENCOUNTER — Ambulatory Visit: Payer: 59 | Admitting: *Deleted

## 2014-09-29 ENCOUNTER — Ambulatory Visit (INDEPENDENT_AMBULATORY_CARE_PROVIDER_SITE_OTHER): Payer: 59 | Admitting: *Deleted

## 2014-09-29 DIAGNOSIS — Z23 Encounter for immunization: Secondary | ICD-10-CM

## 2014-09-29 DIAGNOSIS — R030 Elevated blood-pressure reading, without diagnosis of hypertension: Secondary | ICD-10-CM

## 2014-09-29 LAB — LIPID PANEL
CHOL/HDL RATIO: 5
Cholesterol: 187 mg/dL (ref 0–200)
HDL: 36.2 mg/dL — AB (ref >39.00)
LDL Cholesterol: 127 mg/dL — ABNORMAL HIGH (ref 0–99)
NonHDL: 150.8
TRIGLYCERIDES: 120 mg/dL (ref 0.0–149.0)
VLDL: 24 mg/dL (ref 0.0–40.0)

## 2014-09-29 LAB — HEMOGLOBIN A1C: Hgb A1c MFr Bld: 5.6 % (ref 4.6–6.5)

## 2014-09-29 LAB — BASIC METABOLIC PANEL
BUN: 20 mg/dL (ref 6–23)
CALCIUM: 9.3 mg/dL (ref 8.4–10.5)
CHLORIDE: 105 meq/L (ref 96–112)
CO2: 29 mEq/L (ref 19–32)
CREATININE: 1.2 mg/dL (ref 0.4–1.5)
GFR: 67.84 mL/min (ref >60.00)
Glucose, Bld: 100 mg/dL — ABNORMAL HIGH (ref 70–99)
Potassium: 4 mEq/L (ref 3.5–5.1)
Sodium: 137 mEq/L (ref 135–145)

## 2014-10-03 ENCOUNTER — Encounter: Payer: Self-pay | Admitting: Gastroenterology

## 2014-10-11 ENCOUNTER — Encounter: Payer: 59 | Admitting: Gastroenterology

## 2014-11-27 ENCOUNTER — Ambulatory Visit (AMBULATORY_SURGERY_CENTER): Payer: 59 | Admitting: *Deleted

## 2014-11-27 VITALS — Ht 70.0 in | Wt 205.4 lb

## 2014-11-27 DIAGNOSIS — Z86010 Personal history of colonic polyps: Secondary | ICD-10-CM

## 2014-11-27 NOTE — Progress Notes (Signed)
No egg or soy allergy. ewm No issues with past sedation. ewm Pt has a movi prep kit on hold at his pharmacy per the pharmacy ewm No diet pills. ewm Pt declined emmi video. ewm

## 2014-12-07 ENCOUNTER — Ambulatory Visit (AMBULATORY_SURGERY_CENTER): Payer: 59 | Admitting: Gastroenterology

## 2014-12-07 ENCOUNTER — Encounter: Payer: Self-pay | Admitting: Gastroenterology

## 2014-12-07 VITALS — BP 131/80 | HR 53 | Temp 97.1°F | Resp 18 | Ht 70.0 in | Wt 207.0 lb

## 2014-12-07 DIAGNOSIS — D120 Benign neoplasm of cecum: Secondary | ICD-10-CM

## 2014-12-07 DIAGNOSIS — D125 Benign neoplasm of sigmoid colon: Secondary | ICD-10-CM

## 2014-12-07 DIAGNOSIS — Z86010 Personal history of colonic polyps: Secondary | ICD-10-CM

## 2014-12-07 DIAGNOSIS — D123 Benign neoplasm of transverse colon: Secondary | ICD-10-CM

## 2014-12-07 MED ORDER — SODIUM CHLORIDE 0.9 % IV SOLN: 500.0000 mL | INTRAVENOUS | Status: DC

## 2014-12-07 NOTE — Op Note (Signed)
[facility]  [address] [place], [id number]   COLONOSCOPY PROCEDURE REPORT  PATIENT: Bradley Gilbert, Bradley Gilbert  MR#: [id number] BIRTHDATE: 1951-05-17 , 89  yrs. old GENDER: male ENDOSCOPIST: Ladene Artist, MD, [facility] PROCEDURE DATE:  12/07/2014 PROCEDURE:   Colonoscopy with biopsy and Colonoscopy with snare polypectomy First Screening Colonoscopy - Avg.  risk and is 50 yrs.  old or older - No.  Prior Negative Screening - Now for repeat screening. N/A  History of Adenoma - Now for follow-up colonoscopy & has been > or = to 3 yrs.  Yes hx of adenoma.  Has been 3 or more years since last colonoscopy.  Polyps Removed Today? Yes. ASA CLASS:   Class II INDICATIONS:surveillance colonoscopy based on a history of adenomatous colonic polyp(s). MEDICATIONS: Monitored anesthesia care and Propofol 200 mg IV DESCRIPTION OF PROCEDURE:   After the risks benefits and alternatives of the procedure were thoroughly explained, informed consent was obtained.  The digital rectal exam revealed no abnormalities of the rectum.   The LB [id number]  endoscope was introduced through the anus and advanced to the cecum, which was identified by both the appendix and ileocecal valve. No adverse events experienced.   The quality of the prep was excellent, using MoviPrep  The instrument was then slowly withdrawn as the colon was fully examined.  COLON FINDINGS: A sessile polyp measuring 4 mm in size was found at the cecum.  A polypectomy was performed with cold forceps.  The resection was complete, the polyp tissue was completely retrieved and sent to histology.   Three sessile polyps measuring 6 mm in size were found in the transverse colon and sigmoid colon. Polypectomies were performed with a cold snare.  The resection was complete, the polyp tissue was completely retrieved and sent to histology.   There was diverticulosis noted in the sigmoid colon. The examination was otherwise normal.   Retroflexed views revealed internal Grade I hemorrhoids. The time to cecum=2 minutes 43 seconds.  Withdrawal time=11 minutes 22 seconds.  The scope was withdrawn and the procedure completed. COMPLICATIONS: There were no immediate complications.  ENDOSCOPIC IMPRESSION: 1.   Sessile polyp at the cecum; polypectomy performed with cold forceps 2.   Three sessile polyps in the transverse colon and sigmoid colon; polypectomies performed with a cold snare 3.   Diverticulosis in the sigmoid colon 4.   Grade l internal hemorrhoids  RECOMMENDATIONS: 1.  Await pathology results 2.  High fiber diet with liberal fluid intake. 3.  Repeat Colonoscopy in 5 years.  eSigned:  Ladene Artist, MD, [facility] 12/07/2014 10:07 AM

## 2014-12-07 NOTE — Progress Notes (Signed)
Called to room to assist during endoscopic procedure.  Patient ID and intended procedure confirmed with present staff. Received instructions for my participation in the procedure from the performing physician.  

## 2014-12-07 NOTE — Progress Notes (Signed)
Report to PACU, RN, vss, BBS= Clear.  

## 2014-12-07 NOTE — Patient Instructions (Signed)
Discharge instructions given. Handouts on polyps and hemorrhoids, Resume previous medications. YOU HAD AN ENDOSCOPIC PROCEDURE TODAY AT [facility]: Refer to the procedure report that was given to you for any specific questions about what was found during the examination.  If the procedure report does not answer your questions, please call your gastroenterologist to clarify.  If you requested that your care partner not be given the details of your procedure findings, then the procedure report has been included in a sealed envelope for you to review at your convenience later.  YOU SHOULD EXPECT: Some feelings of bloating in the abdomen. Passage of more gas than usual.  Walking can help get rid of the air that was put into your GI tract during the procedure and reduce the bloating. If you had a lower endoscopy (such as a colonoscopy or flexible sigmoidoscopy) you may notice spotting of blood in your stool or on the toilet paper. If you underwent a bowel prep for your procedure, then you may not have a normal bowel movement for a few days.  DIET: Your first meal following the procedure should be a light meal and then it is ok to progress to your normal diet.  A half-sandwich or bowl of soup is an example of a good first meal.  Heavy or fried foods are harder to digest and may make you feel nauseous or bloated.  Likewise meals heavy in dairy and vegetables can cause extra gas to form and this can also increase the bloating.  Drink plenty of fluids but you should avoid alcoholic beverages for 24 hours.  ACTIVITY: Your care partner should take you home directly after the procedure.  You should plan to take it easy, moving slowly for the rest of the day.  You can resume normal activity the day after the procedure however you should NOT DRIVE or use heavy machinery for 24 hours (because of the sedation medicines used during the test).    SYMPTOMS TO REPORT IMMEDIATELY: A gastroenterologist can  be reached at any hour.  During normal business hours, 8:30 AM to 5:00 PM Monday through Friday, call [phone].  After hours and on weekends, please call the GI answering service at [phone] who will take a message and have the physician on call contact you.   Following lower endoscopy (colonoscopy or flexible sigmoidoscopy):  Excessive amounts of blood in the stool  Significant tenderness or worsening of abdominal pains  Swelling of the abdomen that is new, acute  Fever of 100F or higher  FOLLOW UP: If any biopsies were taken you will be contacted by phone or by letter within the next 1-3 weeks.  Call your gastroenterologist if you have not heard about the biopsies in 3 weeks.  Our staff will call the home number listed on your records the next business day following your procedure to check on you and address any questions or concerns that you may have at that time regarding the information given to you following your procedure. This is a courtesy call and so if there is no answer at the home number and we have not heard from you through the emergency physician on call, we will assume that you have returned to your regular daily activities without incident.  SIGNATURES/CONFIDENTIALITY: You and/or your care partner have signed paperwork which will be entered into your electronic medical record.  These signatures attest to the fact that that the information above on your After Visit Summary has been reviewed  and is understood.  Full responsibility of the confidentiality of this discharge information lies with you and/or your care-partner. 

## 2014-12-08 ENCOUNTER — Telehealth: Payer: Self-pay | Admitting: *Deleted

## 2014-12-08 NOTE — Telephone Encounter (Signed)
  Follow up Call-  Call back number 12/07/2014  Post procedure Call Back phone  # [phone]  Permission to leave phone message Yes     Patient questions:  Do you have a fever, pain , or abdominal swelling? No. Pain Score  0 *  Have you tolerated food without any problems? Yes.    Have you been able to return to your normal activities? Yes.    Do you have any questions about your discharge instructions: Diet   No. Medications  No. Follow up visit  No.  Do you have questions or concerns about your Care? No.  Actions: * If pain score is 4 or above: No action needed, pain <4.

## 2014-12-13 ENCOUNTER — Encounter: Payer: Self-pay | Admitting: Gastroenterology

## 2015-02-14 ENCOUNTER — Encounter: Payer: Self-pay | Admitting: Internal Medicine

## 2015-02-14 ENCOUNTER — Ambulatory Visit (INDEPENDENT_AMBULATORY_CARE_PROVIDER_SITE_OTHER): Payer: Medicare Other | Admitting: Internal Medicine

## 2015-02-14 VITALS — BP 128/80 | HR 59 | Temp 97.7°F | Resp 18 | Ht 70.0 in | Wt 198.0 lb

## 2015-02-14 DIAGNOSIS — Z23 Encounter for immunization: Secondary | ICD-10-CM | POA: Diagnosis not present

## 2015-02-14 DIAGNOSIS — Z0000 Encounter for general adult medical examination without abnormal findings: Secondary | ICD-10-CM | POA: Diagnosis not present

## 2015-02-14 NOTE — Addendum Note (Signed)
Addended by: Valerie Salts on: 02/14/2015 10:28 AM   Modules accepted: Orders

## 2015-02-14 NOTE — Progress Notes (Signed)
Pre visit review using our clinic review tool, if applicable. No additional management support is needed unless otherwise documented below in the visit note. 

## 2015-02-14 NOTE — Progress Notes (Signed)
Subjective:    Patient ID: Bradley Gilbert, male    DOB: September 27, 1951, 64 y.o.   MRN: [id number]  HPI  Here for wellness and f/u;  Overall doing ok;  Pt denies Chest pain, worsening SOB, DOE, wheezing, orthopnea, PND, worsening LE edema, palpitations, dizziness or syncope.  Pt denies neurological change such as new headache, facial or extremity weakness.  Pt denies polydipsia, polyuria, or low sugar symptoms. Pt states overall good compliance with treatment and medications, good tolerability, and has been trying to follow appropriate diet.  Pt denies worsening depressive symptoms, suicidal ideation or panic. No fever, night sweats, wt loss, loss of appetite, or other constitutional symptoms.  Pt states good ability with ADL's, has low fall risk, home safety reviewed and adequate, no other significant changes in hearing or vision, and only occasionally active with exercise. Has hx of RMSF, bit by another tick left leg 8 days ago. No fever or rash, tick only on for 12 hrs.  No current complaints Past Medical History  Diagnosis Date  . Hearing loss   . OA (osteoarthritis)     KNEES AND LEFT SHOULDER  . Left rotator cuff tear   . Allergic rhinitis   . History of colon polyps     BENGIN  . Coronary artery disease     Cath 08/15/11 with 30% mid LAD, o/w no CAD   Past Surgical History  Procedure Laterality Date  . Laparoscopic inguinal hernia repair Right 09-05-2011  . 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 Transthoracic echocardiogram  08-19-2011    MILD LVH/  EF 48-54%/  GRADE I DIASTOLIC DYSFUNCTION  . Knee arthroscopy Right YRS AGO  . Shoulder arthroscopy with subacromial decompression and bicep tendon repair Left 05/12/2014    Procedure: LEFT ARTHROSCOPY SHOULDER WITH EXAM UNDER ANESTHESIA,SUBACROMIAL DECOMPRESSION DISTAL  CLAVICAL RESECTION LABIAL REPAIR VERSES DEBRIDEMENT;  Surgeon: Sydnee Cabal, MD;  Location: [facility];  Service: Orthopedics;  Laterality: Left;  . Carpal tunnel release Right 1990  . Colonoscopy    . Polypectomy      reports that he has never smoked. He has never used smokeless tobacco. He reports that he does not drink alcohol or use illicit drugs. family history includes COPD in his father; Cancer in his father; Diabetes in his mother; Heart disease in his father. There is no history of Colon cancer, Prostate cancer, Rectal cancer, or Stomach cancer. Allergies  Allergen Reactions  . Shellfish Allergy Swelling    ALL TYPES --- Eyes swell , severe headache   Current Outpatient Prescriptions on File Prior to Visit  Medication Sig Dispense Refill  . aspirin 81 MG tablet Take 81 mg by mouth daily.    . Multiple Vitamin (MULTIVITAMIN) tablet Take 1 tablet by mouth daily.    . Omega-3 Fatty Acids (FP FISH OIL PO) Take by mouth daily.    Marland Kitchen RA KRILL OIL PO Take by mouth daily.     No current facility-administered medications on file prior to visit.     Review of Systems Constitutional: Negative for increased diaphoresis, other activity, appetite or siginficant weight change other than noted HENT: Negative for worsening hearing loss, ear pain, facial swelling, mouth sores and neck stiffness.   Eyes: Negative for other worsening pain, redness or visual disturbance.  Respiratory: Negative for shortness of breath and wheezing  Cardiovascular: Negative for chest pain and palpitations.  Gastrointestinal: Negative for diarrhea, blood in stool, abdominal distention or other pain Genitourinary: Negative for hematuria, flank pain or change in urine volume.  Musculoskeletal: Negative for myalgias or other joint complaints.  Skin: Negative for color change and wound or drainage.  Neurological: Negative for syncope and numbness. other than noted Hematological: Negative for  adenopathy. or other swelling Psychiatric/Behavioral: Negative for hallucinations, SI, self-injury, decreased concentration or other worsening agitation.      Objective:   Physical Exam BP 128/80 mmHg  Pulse 59  Temp(Src) 97.7 F (36.5 C) (Oral)  Resp 18  Ht 5\' 10"  (1.778 m)  Wt 198 lb (89.812 kg)  BMI 28.41 kg/m2  SpO2 97% VS noted,  Constitutional: Pt is oriented to person, place, and time. Appears well-developed and well-nourished, in no significant distress Head: Normocephalic and atraumatic.  Right Ear: External ear normal.  Left Ear: External ear normal.  Nose: Nose normal.  Mouth/Throat: Oropharynx is clear and moist.  Eyes: Conjunctivae and EOM are normal. Pupils are equal, round, and reactive to light.  Neck: Normal range of motion. Neck supple. No JVD present. No tracheal deviation present or significant neck LA or mass Cardiovascular: Normal rate, regular rhythm, normal heart sounds and intact distal pulses.   Pulmonary/Chest: Effort normal and breath sounds without rales or wheezing  Abdominal: Soft. Bowel sounds are normal. NT. No HSM  Musculoskeletal: Normal range of motion. Exhibits no edema.  Lymphadenopathy:  Has no cervical adenopathy.  Neurological: Pt is alert and oriented to person, place, and time. Pt has normal reflexes. No cranial nerve deficit. Motor grossly intact Skin: Skin is warm and dry. No rash noted.  Psychiatric:  Has normal mood and affect. Behavior is normal.      Assessment & Plan:

## 2015-02-14 NOTE — Assessment & Plan Note (Signed)

## 2015-02-14 NOTE — Patient Instructions (Signed)
You had the new Prevnar pneumonia shot today  Please continue all other medications as before, and refills have been done if requested.  Please have the pharmacy call with any other refills you may need.  Please continue your efforts at being more active, low cholesterol diet, and weight control.  You are otherwise up to date with prevention measures today.  Please keep your appointments with your specialists as you may have planned  Please go to the LAB in the Basement (turn left off the elevator) for the tests to be done today  You will be contacted by phone if any changes need to be made immediately.  Otherwise, you will receive a letter about your results with an explanation, but please check with MyChart first.  Please remember to sign up for MyChart if you have not done so, as this will be important to you in the future with finding out test results, communicating by private email, and scheduling acute appointments online when needed.  Please return in 1 year for your yearly visit, or sooner if needed, with Lab testing done 3-5 days before

## 2015-02-20 ENCOUNTER — Encounter: Payer: 59 | Admitting: Internal Medicine

## 2015-02-21 ENCOUNTER — Other Ambulatory Visit (INDEPENDENT_AMBULATORY_CARE_PROVIDER_SITE_OTHER): Payer: Medicare Other

## 2015-02-21 DIAGNOSIS — Z0000 Encounter for general adult medical examination without abnormal findings: Secondary | ICD-10-CM | POA: Diagnosis not present

## 2015-02-21 LAB — CBC WITH DIFFERENTIAL/PLATELET
BASOS PCT: 0.4 % (ref 0.0–3.0)
Basophils Absolute: 0 10*3/uL (ref 0.0–0.1)
EOS PCT: 3.8 % (ref 0.0–5.0)
Eosinophils Absolute: 0.3 10*3/uL (ref 0.0–0.7)
HCT: 46.4 % (ref 39.0–52.0)
Hemoglobin: 15.9 g/dL (ref 13.0–17.0)
LYMPHS ABS: 2.4 10*3/uL (ref 0.7–4.0)
LYMPHS PCT: 30.9 % (ref 12.0–46.0)
MCHC: 34.2 g/dL (ref 30.0–36.0)
MCV: 92.5 fl (ref 78.0–100.0)
MONO ABS: 0.6 10*3/uL (ref 0.1–1.0)
Monocytes Relative: 7.6 % (ref 3.0–12.0)
Neutro Abs: 4.5 10*3/uL (ref 1.4–7.7)
Neutrophils Relative %: 57.3 % (ref 43.0–77.0)
PLATELETS: 203 10*3/uL (ref 150.0–400.0)
RBC: 5.02 Mil/uL (ref 4.22–5.81)
RDW: 13 % (ref 11.5–15.5)
WBC: 7.8 10*3/uL (ref 4.0–10.5)

## 2015-02-21 LAB — HEPATIC FUNCTION PANEL
ALBUMIN: 4.2 g/dL (ref 3.5–5.2)
ALT: 22 U/L (ref 0–53)
AST: 22 U/L (ref 0–37)
Alkaline Phosphatase: 41 U/L (ref 39–117)
Bilirubin, Direct: 0.1 mg/dL (ref 0.0–0.3)
Total Bilirubin: 0.6 mg/dL (ref 0.2–1.2)
Total Protein: 6.9 g/dL (ref 6.0–8.3)

## 2015-02-21 LAB — URINALYSIS, ROUTINE W REFLEX MICROSCOPIC
Bilirubin Urine: NEGATIVE
Hgb urine dipstick: NEGATIVE
Ketones, ur: NEGATIVE
Leukocytes, UA: NEGATIVE
Nitrite: NEGATIVE
PH: 6.5 (ref 5.0–8.0)
RBC / HPF: NONE SEEN (ref 0–?)
Specific Gravity, Urine: 1.025 (ref 1.000–1.030)
Total Protein, Urine: NEGATIVE
Urine Glucose: NEGATIVE
Urobilinogen, UA: 0.2 (ref 0.0–1.0)

## 2015-02-21 LAB — BASIC METABOLIC PANEL
BUN: 19 mg/dL (ref 6–23)
CO2: 31 mEq/L (ref 19–32)
Calcium: 9.8 mg/dL (ref 8.4–10.5)
Chloride: 106 mEq/L (ref 96–112)
Creatinine, Ser: 1.09 mg/dL (ref 0.40–1.50)
GFR: 72.08 mL/min (ref >60.00)
Glucose, Bld: 106 mg/dL — ABNORMAL HIGH (ref 70–99)
Potassium: 5 mEq/L (ref 3.5–5.1)
Sodium: 140 mEq/L (ref 135–145)

## 2015-02-21 LAB — LIPID PANEL
CHOL/HDL RATIO: 4
CHOLESTEROL: 165 mg/dL (ref 0–200)
HDL: 46.9 mg/dL (ref >39.00)
LDL CALC: 95 mg/dL (ref 0–99)
NonHDL: 118.1
TRIGLYCERIDES: 118 mg/dL (ref 0.0–149.0)
VLDL: 23.6 mg/dL (ref 0.0–40.0)

## 2015-02-21 LAB — TSH: TSH: 1.35 u[IU]/mL (ref 0.35–4.50)

## 2015-02-21 LAB — PSA: PSA: 1.37 ng/mL (ref 0.10–4.00)

## 2015-03-14 ENCOUNTER — Ambulatory Visit: Payer: 59 | Admitting: Internal Medicine

## 2015-06-11 ENCOUNTER — Telehealth: Payer: Self-pay | Admitting: Internal Medicine

## 2015-06-11 NOTE — Telephone Encounter (Signed)
PLEASE NOTE: All timestamps contained within this report are represented as [place] Standard Time. CONFIDENTIALTY NOTICE: This fax transmission is intended only for the addressee. It contains information that is legally privileged, confidential or otherwise protected from use or disclosure. If you are not the intended recipient, you are strictly prohibited from reviewing, disclosing, copying using or disseminating any of this information or taking any action in reliance on or regarding this information. If you have received this fax in error, please notify us immediately by telephone so that we can arrange for its return to [place]. Phone: [phone], Toll-Free: [phone], Fax: [phone] Page: 1 of 1 Call Id: [id number] [facility] - Client [place] Patient Name: Bradley Gilbert DOB: 1951-10-21 Initial Comment Caller States has had bad stomach craps over the last few days, stomach upset, pains, not been able to sleep good. had tick bites over a week ago. Nurse Assessment Nurse: Marcelline Deist, RN, Lynda Date/Time (Eastern Time): 06/11/2015 1:27:49 PM Confirm and document reason for call. If symptomatic, describe symptoms. ---Caller states has had bad stomach cramps over the last few days, stomach upset/nausea, pains. He has not been able to sleep good. Had tick bites over a week ago, wasn't sure how long the tick was on him, hard to get it off. Has some joint pain as well. Has had an off/on fever. Also, dizziness the other day. Has the patient traveled out of the country within the last 30 days? ---No Does the patient require triage? ---Yes Related visit to physician within the last 2 weeks? ---No Does the PT have any chronic conditions? (i.e. diabetes, asthma, etc.) ---No Guidelines Guideline Title Affirmed Question Affirmed Notes Tick Bite [1] Fever AND [2] red area Final Disposition User See Physician within 4 Hours (or  PCP triage) Marcelline Deist, RN, Lynda Comments Caller states he had 2 ticks, one very small, another larger on his right leg/groin area & on the back of his right knee. He had a red rash on both ankles that disappeared. he reports that he had RMF 2 years ago that put him in the hospital & that this reminds him somewhat of that. His symptoms include headache, joint pain & some dizziness. No availability on schedule today, so nurse scheduled patient for tomorrow am even though it was strongly recommended that he be seen today. The patient has no facility close to him & is unsure if insurance would cover him being seen at an UC/ER. Please contact patient if there is a cancellation tonight, or if he could be seen this afternoon/evening. he uses [facility], [facility]. Referrals REFERRED TO PCP OFFICE REFERRED TO PCP OFFICE Disagree/Comply: Comply

## 2015-06-12 ENCOUNTER — Ambulatory Visit (INDEPENDENT_AMBULATORY_CARE_PROVIDER_SITE_OTHER): Payer: Medicare Other | Admitting: Internal Medicine

## 2015-06-12 ENCOUNTER — Other Ambulatory Visit (INDEPENDENT_AMBULATORY_CARE_PROVIDER_SITE_OTHER): Payer: Medicare Other

## 2015-06-12 ENCOUNTER — Encounter: Payer: Self-pay | Admitting: Internal Medicine

## 2015-06-12 VITALS — BP 138/82 | HR 65 | Temp 98.5°F | Resp 16 | Ht 70.0 in | Wt 204.2 lb

## 2015-06-12 DIAGNOSIS — W57XXXA Bitten or stung by nonvenomous insect and other nonvenomous arthropods, initial encounter: Secondary | ICD-10-CM | POA: Diagnosis not present

## 2015-06-12 DIAGNOSIS — L989 Disorder of the skin and subcutaneous tissue, unspecified: Secondary | ICD-10-CM | POA: Diagnosis not present

## 2015-06-12 DIAGNOSIS — S80869A Insect bite (nonvenomous), unspecified lower leg, initial encounter: Secondary | ICD-10-CM | POA: Insufficient documentation

## 2015-06-12 DIAGNOSIS — S80861A Insect bite (nonvenomous), right lower leg, initial encounter: Secondary | ICD-10-CM

## 2015-06-12 LAB — CBC WITH DIFFERENTIAL/PLATELET
BASOS ABS: 0 10*3/uL (ref 0.0–0.1)
BASOS PCT: 0.5 % (ref 0.0–3.0)
EOS PCT: 7.1 % — AB (ref 0.0–5.0)
Eosinophils Absolute: 0.5 10*3/uL (ref 0.0–0.7)
HCT: 48.8 % (ref 39.0–52.0)
Hemoglobin: 16.7 g/dL (ref 13.0–17.0)
LYMPHS PCT: 31.9 % (ref 12.0–46.0)
Lymphs Abs: 2.5 10*3/uL (ref 0.7–4.0)
MCHC: 34.3 g/dL (ref 30.0–36.0)
MCV: 92.8 fl (ref 78.0–100.0)
Monocytes Absolute: 0.4 10*3/uL (ref 0.1–1.0)
Monocytes Relative: 4.9 % (ref 3.0–12.0)
NEUTROS PCT: 55.6 % (ref 43.0–77.0)
Neutro Abs: 4.3 10*3/uL (ref 1.4–7.7)
Platelets: 188 10*3/uL (ref 150.0–400.0)
RBC: 5.25 Mil/uL (ref 4.22–5.81)
RDW: 12.8 % (ref 11.5–15.5)
WBC: 7.7 10*3/uL (ref 4.0–10.5)

## 2015-06-12 LAB — COMPREHENSIVE METABOLIC PANEL
ALBUMIN: 4.4 g/dL (ref 3.5–5.2)
ALT: 20 U/L (ref 0–53)
AST: 18 U/L (ref 0–37)
Alkaline Phosphatase: 41 U/L (ref 39–117)
BILIRUBIN TOTAL: 0.8 mg/dL (ref 0.2–1.2)
BUN: 20 mg/dL (ref 6–23)
CHLORIDE: 105 meq/L (ref 96–112)
CO2: 28 meq/L (ref 19–32)
Calcium: 9.7 mg/dL (ref 8.4–10.5)
Creatinine, Ser: 1.15 mg/dL (ref 0.40–1.50)
GFR: 67.69 mL/min (ref >60.00)
Glucose, Bld: 125 mg/dL — ABNORMAL HIGH (ref 70–99)
Potassium: 3.9 mEq/L (ref 3.5–5.1)
Sodium: 140 mEq/L (ref 135–145)
TOTAL PROTEIN: 7.1 g/dL (ref 6.0–8.3)

## 2015-06-12 LAB — SEDIMENTATION RATE: Sed Rate: 4 mm/hr (ref 0–22)

## 2015-06-12 MED ORDER — DOXYCYCLINE HYCLATE 100 MG PO CAPS: 100.0000 mg | ORAL_CAPSULE | Freq: Two times a day (BID) | ORAL | Status: AC

## 2015-06-12 NOTE — Progress Notes (Signed)
Pre visit review using our clinic review tool, if applicable. No additional management support is needed unless otherwise documented below in the visit note. 

## 2015-06-12 NOTE — Progress Notes (Signed)
Subjective:  Patient ID: Bradley Gilbert, male    DOB: 02/10/1951  Age: 64 y.o. MRN: [id number]  CC: Tick Removal   HPI HRIDAAN BOUSE presents for concerns about a tick bite. He tells me that 10 days ago he removed four ticks off of his body. 3 were dog ticks and one was a deer tick. There were located in the right upper thigh and groin. There is no rash or symptoms in the groin area but he is concerned because he has developed some other symptoms such as myalgias, neck stiffness and arthralgias. He has noticed some sweats but has not had any fever, chills, or vomiting. He has had now mild nausea but no headache. He had one episode of diarrhea 2 days ago but none since then.  Outpatient Prescriptions Prior to Visit  Medication Sig Dispense Refill  . aspirin 81 MG tablet Take 81 mg by mouth daily.    . Multiple Vitamin (MULTIVITAMIN) tablet Take 1 tablet by mouth daily.    . Omega-3 Fatty Acids (FP FISH OIL PO) Take by mouth daily.    Marland Kitchen RA KRILL OIL PO Take by mouth daily.     No facility-administered medications prior to visit.    ROS Review of Systems  Constitutional: Negative.  Negative for fever, chills, diaphoresis, appetite change and fatigue.  HENT: Negative.  Negative for sore throat.   Eyes: Negative.  Negative for visual disturbance.  Respiratory: Negative.  Negative for cough, choking, chest tightness, shortness of breath and stridor.   Cardiovascular: Negative.  Negative for chest pain, palpitations and leg swelling.  Gastrointestinal: Positive for nausea. Negative for vomiting, abdominal pain, constipation and blood in stool.  Endocrine: Negative.   Genitourinary: Negative.   Musculoskeletal: Positive for myalgias, arthralgias, neck pain and neck stiffness. Negative for back pain, joint swelling and gait problem.  Skin: Negative.  Negative for rash.  Allergic/Immunologic: Negative.   Neurological: Negative.  Negative for headaches.  Hematological: Negative.   Negative for adenopathy. Does not bruise/bleed easily.  Psychiatric/Behavioral: Negative.     Objective:  BP 138/82 mmHg  Pulse 65  Temp(Src) 98.5 F (36.9 C) (Oral)  Resp 16  Ht 5\' 10"  (1.778 m)  Wt 204 lb 4 oz (92.647 kg)  BMI 29.31 kg/m2  SpO2 95%  BP Readings from Last 3 Encounters:  06/12/15 138/82  02/14/15 128/80  12/07/14 131/80    Wt Readings from Last 3 Encounters:  06/12/15 204 lb 4 oz (92.647 kg)  02/14/15 198 lb (89.812 kg)  12/07/14 207 lb (93.895 kg)    Physical Exam  Constitutional: He is oriented to person, place, and time.  Non-toxic appearance. He does not have a sickly appearance. He does not appear ill. No distress.  HENT:  Nose: Nose normal.  Mouth/Throat: Oropharynx is clear and moist. No oropharyngeal exudate.  Eyes: Conjunctivae are normal. Right eye exhibits no discharge. Left eye exhibits no discharge. No scleral icterus.  Neck: Normal range of motion. Neck supple. No JVD present. No tracheal deviation present. No thyromegaly present.  Cardiovascular: Normal rate, regular rhythm, normal heart sounds and intact distal pulses.  Exam reveals no gallop and no friction rub.   No murmur heard. Pulmonary/Chest: Effort normal and breath sounds normal. No stridor. No respiratory distress. He has no wheezes. He has no rales. He exhibits no tenderness.  Abdominal: Soft. Bowel sounds are normal. He exhibits no distension and no mass. There is no tenderness. There is no rebound and no guarding.  Musculoskeletal: Normal range of motion. He exhibits no edema or tenderness.  Lymphadenopathy:    He has no cervical adenopathy.  Neurological: He is oriented to person, place, and time.  Skin: Skin is warm and dry. No rash noted. He is not diaphoretic. No erythema. No pallor.     He is concerned about a crusty lesion on his right upper arm.  Psychiatric: He has a normal mood and affect. His behavior is normal. Judgment and thought content normal.  Vitals  reviewed.   Lab Results  Component Value Date   WBC 7.7 06/12/2015   HGB 16.7 06/12/2015   HCT 48.8 06/12/2015   PLT 188.0 06/12/2015   GLUCOSE 125* 06/12/2015   CHOL 165 02/21/2015   TRIG 118.0 02/21/2015   HDL 46.90 02/21/2015   LDLDIRECT 133.1 01/03/2011   LDLCALC 95 02/21/2015   ALT 20 06/12/2015   AST 18 06/12/2015   NA 140 06/12/2015   K 3.9 06/12/2015   CL 105 06/12/2015   CREATININE 1.15 06/12/2015   BUN 20 06/12/2015   CO2 28 06/12/2015   TSH 1.35 02/21/2015   PSA 1.37 02/21/2015   INR 1.18 08/15/2011   HGBA1C 5.6 09/29/2014    No results found.  Assessment & Plan:   Dameion was seen today for tick removal.  Diagnoses and all orders for this visit:  Tick bite of lower leg, right, initial encounter- I will treat for [facility] spotted fever and Lyme disease with doxycycline. We'll check his labs to see if there is any concern for systemic illness at this time. We'll check antibody titers for Lyme and [facility] spotted fever as well. Orders: -     Comprehensive metabolic panel; Future -     CBC with Differential/Platelet; Future -     Sedimentation rate; Future -     Lyme Ab/Western Blot Reflex; Future -     Rocky mtn spotted fvr abs pnl(IgG+IgM); Future -     doxycycline (VIBRAMYCIN) 100 MG capsule; Take 1 capsule (100 mg total) by mouth 2 (two) times daily.  Skin lesion of right arm- I told him I'm concerned that this may be a cancer and I have asked him to see dermatology. Orders: -     Ambulatory referral to Dermatology  I am having Mr. Biffle start on doxycycline. I am also having him maintain his multivitamin, Omega-3 Fatty Acids (FP FISH OIL PO), RA KRILL OIL PO, and aspirin.  Meds ordered this encounter  Medications  . doxycycline (VIBRAMYCIN) 100 MG capsule    Sig: Take 1 capsule (100 mg total) by mouth 2 (two) times daily.    Dispense:  28 capsule    Refill:  0     Follow-up: Return in about 2 weeks (around 06/26/2015).  Scarlette Calico,  MD

## 2015-06-12 NOTE — Patient Instructions (Signed)
Tick Bite Information Ticks are insects that attach themselves to the skin and draw blood for food. There are various types of ticks. Common types include wood ticks and deer ticks. Most ticks live in shrubs and grassy areas. Ticks can climb onto your body when you make contact with leaves or grass where the tick is waiting. The most common places on the body for ticks to attach themselves are the scalp, neck, armpits, waist, and groin. Most tick bites are harmless, but sometimes ticks carry germs that cause diseases. These germs can be spread to a person during the tick's feeding process. The chance of a disease spreading through a tick bite depends on:   The type of tick.  Time of year.   How long the tick is attached.   Geographic location.  HOW CAN YOU PREVENT TICK BITES? Take these steps to help prevent tick bites when you are outdoors:  Wear protective clothing. Long sleeves and long pants are best.   Wear white clothes so you can see ticks more easily.  Tuck your pant legs into your socks.   If walking on a trail, stay in the middle of the trail to avoid brushing against bushes.  Avoid walking through areas with long grass.  Put insect repellent on all exposed skin and along boot tops, pant legs, and sleeve cuffs.   Check clothing, hair, and skin repeatedly and before going inside.   Brush off any ticks that are not attached.  Take a shower or bath as soon as possible after being outdoors.  WHAT IS THE PROPER WAY TO REMOVE A TICK? Ticks should be removed as soon as possible to help prevent diseases caused by tick bites. 1. If latex gloves are available, put them on before trying to remove a tick.  2. Using fine-point tweezers, grasp the tick as close to the skin as possible. You may also use curved forceps or a tick removal tool. Grasp the tick as close to its head as possible. Avoid grasping the tick on its body. 3. Pull gently with steady upward pressure until  the tick lets go. Do not twist the tick or jerk it suddenly. This may break off the tick's head or mouth parts. 4. Do not squeeze or crush the tick's body. This could force disease-carrying fluids from the tick into your body.  5. After the tick is removed, wash the bite area and your hands with soap and water or other disinfectant such as alcohol. 6. Apply a small amount of antiseptic cream or ointment to the bite site.  7. Wash and disinfect any instruments that were used.  Do not try to remove a tick by applying a hot match, petroleum jelly, or fingernail polish to the tick. These methods do not work and may increase the chances of disease being spread from the tick bite.  WHEN SHOULD YOU SEEK MEDICAL CARE? Contact your health care provider if you are unable to remove a tick from your skin or if a part of the tick breaks off and is stuck in the skin.  After a tick bite, you need to be aware of signs and symptoms that could be related to diseases spread by ticks. Contact your health care provider if you develop any of the following in the days or weeks after the tick bite:  Unexplained fever.  Rash. A circular rash that appears days or weeks after the tick bite may indicate the possibility of Lyme disease. The rash may resemble   a target with a bull's-eye and may occur at a different part of your body than the tick bite.  Redness and swelling in the area of the tick bite.   Tender, swollen lymph glands.   Diarrhea.   Weight loss.   Cough.   Fatigue.   Muscle, joint, or bone pain.   Abdominal pain.   Headache.   Lethargy or a change in your level of consciousness.  Difficulty walking or moving your legs.   Numbness in the legs.   Paralysis.  Shortness of breath.   Confusion.   Repeated vomiting.  Document Released: 11/07/2000 Document Revised: 08/31/2013 Document Reviewed: 04/20/2013 [employer] Patient Information 2015 [employer]. This information is  not intended to replace advice given to you by your health care provider. Make sure you discuss any questions you have with your health care provider.  

## 2015-06-14 ENCOUNTER — Encounter: Payer: Self-pay | Admitting: Internal Medicine

## 2015-06-14 LAB — ROCKY MTN SPOTTED FVR ABS PNL(IGG+IGM)
RMSF IgG: 0.55 IV
RMSF IgM: 0.42 IV

## 2015-06-14 LAB — LYME AB/WESTERN BLOT REFLEX: B burgdorferi Ab IgG+IgM: 0.17 {ISR}

## 2016-01-01 ENCOUNTER — Ambulatory Visit (INDEPENDENT_AMBULATORY_CARE_PROVIDER_SITE_OTHER): Payer: Medicare Other | Admitting: Internal Medicine

## 2016-01-01 ENCOUNTER — Encounter: Payer: Self-pay | Admitting: Internal Medicine

## 2016-01-01 VITALS — BP 130/80 | HR 77 | Temp 98.3°F | Resp 20 | Wt 198.0 lb

## 2016-01-01 DIAGNOSIS — R05 Cough: Secondary | ICD-10-CM

## 2016-01-01 DIAGNOSIS — Z23 Encounter for immunization: Secondary | ICD-10-CM

## 2016-01-01 DIAGNOSIS — R062 Wheezing: Secondary | ICD-10-CM | POA: Diagnosis not present

## 2016-01-01 DIAGNOSIS — R059 Cough, unspecified: Secondary | ICD-10-CM | POA: Insufficient documentation

## 2016-01-01 DIAGNOSIS — R0689 Other abnormalities of breathing: Secondary | ICD-10-CM | POA: Insufficient documentation

## 2016-01-01 DIAGNOSIS — R0989 Other specified symptoms and signs involving the circulatory and respiratory systems: Secondary | ICD-10-CM | POA: Diagnosis not present

## 2016-01-01 MED ORDER — CEFTRIAXONE SODIUM 1 G IJ SOLR
1.0000 g | Freq: Once | INTRAMUSCULAR | Status: AC
  Administered 2016-01-01: 1 g via INTRAMUSCULAR

## 2016-01-01 MED ORDER — HYDROCOD POLST-CPM POLST ER 10-8 MG/5ML PO SUER: 5.0000 mL | Freq: Two times a day (BID) | ORAL | Status: DC | PRN

## 2016-01-01 MED ORDER — LEVOFLOXACIN 500 MG PO TABS: 500.0000 mg | ORAL_TABLET | Freq: Every day | ORAL | Status: DC

## 2016-01-01 MED ORDER — PREDNISONE 10 MG PO TABS
ORAL_TABLET | ORAL | Status: DC
Start: 2016-01-01 — End: 2016-02-19

## 2016-01-01 MED ORDER — ALBUTEROL SULFATE HFA 108 (90 BASE) MCG/ACT IN AERS: 2.0000 | INHALATION_SPRAY | Freq: Four times a day (QID) | RESPIRATORY_TRACT | Status: DC | PRN

## 2016-01-01 MED ORDER — METHYLPREDNISOLONE ACETATE 80 MG/ML IJ SUSP
80.0000 mg | Freq: Once | INTRAMUSCULAR | Status: AC
  Administered 2016-01-01: 80 mg via INTRAMUSCULAR

## 2016-01-01 NOTE — Progress Notes (Signed)
Subjective:    Patient ID: Bradley Gilbert, male    DOB: May 11, 1951, 65 y.o.   MRN: [id number]  HPI  Here with acute onset mild to mod 2-3 days ST, HA, general weakness and malaise, with prod cough greenish sputum, but Pt denies chest pain, increased sob or doe, wheezing, orthopnea, PND, increased LE swelling, palpitations, dizziness or syncope, except for onset mild wheezing and sob since last pm.  Pt denies new neurological symptoms such as new headache, or facial or extremity weakness or numbness   Pt denies polydipsia, polyuria Past Medical History  Diagnosis Date  . Hearing loss   . OA (osteoarthritis)     KNEES AND LEFT SHOULDER  . Left rotator cuff tear   . Allergic rhinitis   . History of colon polyps     BENGIN  . Coronary artery disease     Cath 08/15/11 with 30% mid LAD, o/w no CAD   Past Surgical History  Procedure Laterality Date  . Laparoscopic inguinal hernia repair Right 09-05-2011  . 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 Transthoracic echocardiogram  08-19-2011    MILD LVH/  EF [id number]  GRADE I DIASTOLIC DYSFUNCTION  . Knee arthroscopy Right YRS AGO  . Shoulder arthroscopy with subacromial decompression and bicep tendon repair Left 05/12/2014    Procedure: LEFT ARTHROSCOPY SHOULDER WITH EXAM UNDER ANESTHESIA,SUBACROMIAL DECOMPRESSION DISTAL CLAVICAL RESECTION LABIAL REPAIR VERSES DEBRIDEMENT;  Surgeon: Sydnee Cabal, MD;  Location: [facility];  Service: Orthopedics;  Laterality: Left;  . Carpal tunnel release Right 1990  . Colonoscopy    . Polypectomy      reports that he has never smoked. He has never used smokeless tobacco. He reports that he does not drink alcohol or use illicit drugs. family history includes COPD in his father; Cancer in his father; Diabetes in his mother;  Heart disease in his father. There is no history of Colon cancer, Prostate cancer, Rectal cancer, or Stomach cancer. Allergies  Allergen Reactions  . Shellfish Allergy Swelling    ALL TYPES --- Eyes swell , severe headache   Current Outpatient Prescriptions on File Prior to Visit  Medication Sig Dispense Refill  . aspirin 81 MG tablet Take 81 mg by mouth daily.    . Multiple Vitamin (MULTIVITAMIN) tablet Take 1 tablet by mouth daily.    . Omega-3 Fatty Acids (FP FISH OIL PO) Take by mouth daily.    Marland Kitchen RA KRILL OIL PO Take by mouth daily.     No current facility-administered medications on file prior to visit.   Review of Systems  Constitutional: Negative for unusual diaphoresis or night sweats HENT: Negative for ringing in ear or discharge Eyes: Negative for double vision or worsening visual disturbance.  Respiratory: Negative for choking and stridor.   Gastrointestinal: Negative for vomiting or other signifcant bowel change Genitourinary: Negative for hematuria or change in urine volume.  Musculoskeletal: Negative for other MSK pain or swelling Skin: Negative for color change and worsening wound.  Neurological: Negative for tremors and numbness other than noted  Psychiatric/Behavioral: Negative for decreased concentration or agitation other than above       Objective:   Physical Exam BP 130/80 mmHg  Pulse 77  Temp(Src) 98.3 F (36.8 C) (Oral)  Resp 20  Wt 198  lb (89.812 kg)  SpO2 94% VS noted, mild ill Constitutional: Pt appears in no significant distress HENT: Head: NCAT.  Right Ear: External ear normal.  Left Ear: External ear normal.  Bilat tm's with mild erythema.  Max sinus areas non tender.  Pharynx with mild erythema, no exudate Eyes: . Pupils are equal, round, and reactive to light. Conjunctivae and EOM are normal Neck: Normal range of motion. Neck supple.  Cardiovascular: Normal rate and regular rhythm.   Pulmonary/Chest: Effort normal and breath sounds without  scattered wheezing and also mild LLL rales at the base.  Neurological: Pt is alert. Not confused , motor grossly intact Skin: Skin is warm. No rash, no LE edema Psychiatric: Pt behavior is normal. No agitation.      Assessment & Plan:

## 2016-01-01 NOTE — Progress Notes (Signed)
Pre visit review using our clinic review tool, if applicable. No additional management support is needed unless otherwise documented below in the visit note. 

## 2016-01-01 NOTE — Patient Instructions (Addendum)
You had the flu shot today, as well as the antibiotic (rocephin) and steroid shots  Please take all new medication as prescribed - the antibiotic, cough medicine, prednisone, and inhaler if needed  Please continue all other medications as before, and refills have been done if requested.  Please have the pharmacy call with any other refills you may need.  Please keep your appointments with your specialists as you may have planned  Please go to the XRAY Department in the Basement (go straight as you get off the elevator) for the x-ray testing tomorrow or as you are able  You will be contacted by phone if any changes need to be made immediately.  Otherwise, you will receive a letter about your results with an explanation, but please check with MyChart first.  Please remember to sign up for MyChart if you have not done so, as this will be important to you in the future with finding out test results, communicating by private email, and scheduling acute appointments online when needed.

## 2016-01-02 NOTE — Assessment & Plan Note (Signed)
With few LLL rales - cant r/o pna, also for rocephin IM today, and cxr f/u in am

## 2016-01-02 NOTE — Assessment & Plan Note (Signed)
Mod, c/w bronchitis vs pna, for cxr tomorrow (xray closed now), for antibx course, cough med prn,  to f/u any worsening symptoms or concerns

## 2016-01-02 NOTE — Assessment & Plan Note (Signed)
Mild to mod, for depomedrol IM, predpac asd, to f/u any worsening symptoms or concerns 

## 2016-02-18 ENCOUNTER — Other Ambulatory Visit (INDEPENDENT_AMBULATORY_CARE_PROVIDER_SITE_OTHER): Payer: Medicare Other

## 2016-02-18 DIAGNOSIS — Z0000 Encounter for general adult medical examination without abnormal findings: Secondary | ICD-10-CM

## 2016-02-18 DIAGNOSIS — R7989 Other specified abnormal findings of blood chemistry: Secondary | ICD-10-CM | POA: Diagnosis not present

## 2016-02-18 LAB — URINALYSIS, ROUTINE W REFLEX MICROSCOPIC
BILIRUBIN URINE: NEGATIVE
Hgb urine dipstick: NEGATIVE
KETONES UR: NEGATIVE
LEUKOCYTES UA: NEGATIVE
NITRITE: NEGATIVE
PH: 6.5 (ref 5.0–8.0)
RBC / HPF: NONE SEEN (ref 0–?)
Specific Gravity, Urine: 1.025 (ref 1.000–1.030)
TOTAL PROTEIN, URINE-UPE24: NEGATIVE
Urine Glucose: NEGATIVE
Urobilinogen, UA: 0.2 (ref 0.0–1.0)
WBC, UA: NONE SEEN (ref 0–?)

## 2016-02-18 LAB — CBC WITH DIFFERENTIAL/PLATELET
BASOS PCT: 0.5 % (ref 0.0–3.0)
Basophils Absolute: 0 10*3/uL (ref 0.0–0.1)
EOS PCT: 4.5 % (ref 0.0–5.0)
Eosinophils Absolute: 0.4 10*3/uL (ref 0.0–0.7)
HCT: 46.5 % (ref 39.0–52.0)
HEMOGLOBIN: 16 g/dL (ref 13.0–17.0)
LYMPHS ABS: 3.1 10*3/uL (ref 0.7–4.0)
Lymphocytes Relative: 33.5 % (ref 12.0–46.0)
MCHC: 34.4 g/dL (ref 30.0–36.0)
MCV: 91.6 fl (ref 78.0–100.0)
MONO ABS: 0.6 10*3/uL (ref 0.1–1.0)
MONOS PCT: 6.2 % (ref 3.0–12.0)
NEUTROS PCT: 55.3 % (ref 43.0–77.0)
Neutro Abs: 5.1 10*3/uL (ref 1.4–7.7)
Platelets: 210 10*3/uL (ref 150.0–400.0)
RBC: 5.08 Mil/uL (ref 4.22–5.81)
RDW: 13.5 % (ref 11.5–15.5)
WBC: 9.2 10*3/uL (ref 4.0–10.5)

## 2016-02-18 LAB — LIPID PANEL
CHOLESTEROL: 191 mg/dL (ref 0–200)
HDL: 47.1 mg/dL (ref >39.00)
NonHDL: 143.61
TRIGLYCERIDES: 244 mg/dL — AB (ref 0.0–149.0)
Total CHOL/HDL Ratio: 4
VLDL: 48.8 mg/dL — ABNORMAL HIGH (ref 0.0–40.0)

## 2016-02-18 LAB — HEPATIC FUNCTION PANEL
ALBUMIN: 4.7 g/dL (ref 3.5–5.2)
ALK PHOS: 45 U/L (ref 39–117)
ALT: 23 U/L (ref 0–53)
AST: 18 U/L (ref 0–37)
BILIRUBIN TOTAL: 0.6 mg/dL (ref 0.2–1.2)
Bilirubin, Direct: 0.1 mg/dL (ref 0.0–0.3)
Total Protein: 7.2 g/dL (ref 6.0–8.3)

## 2016-02-18 LAB — LDL CHOLESTEROL, DIRECT: Direct LDL: 102 mg/dL

## 2016-02-18 LAB — BASIC METABOLIC PANEL
BUN: 16 mg/dL (ref 6–23)
CALCIUM: 10 mg/dL (ref 8.4–10.5)
CHLORIDE: 102 meq/L (ref 96–112)
CO2: 29 meq/L (ref 19–32)
Creatinine, Ser: 1.07 mg/dL (ref 0.40–1.50)
GFR: 73.41 mL/min (ref >60.00)
Glucose, Bld: 98 mg/dL (ref 70–99)
Potassium: 4.2 mEq/L (ref 3.5–5.1)
Sodium: 138 mEq/L (ref 135–145)

## 2016-02-18 LAB — PSA: PSA: 1.62 ng/mL (ref 0.10–4.00)

## 2016-02-18 LAB — TSH: TSH: 1.61 u[IU]/mL (ref 0.35–4.50)

## 2016-02-19 ENCOUNTER — Encounter: Payer: Self-pay | Admitting: Internal Medicine

## 2016-02-19 ENCOUNTER — Ambulatory Visit (INDEPENDENT_AMBULATORY_CARE_PROVIDER_SITE_OTHER): Payer: Medicare Other | Admitting: Internal Medicine

## 2016-02-19 VITALS — BP 130/78 | HR 62 | Temp 98.4°F | Resp 20 | Wt 198.0 lb

## 2016-02-19 DIAGNOSIS — Z1159 Encounter for screening for other viral diseases: Secondary | ICD-10-CM

## 2016-02-19 DIAGNOSIS — Z0000 Encounter for general adult medical examination without abnormal findings: Secondary | ICD-10-CM

## 2016-02-19 MED ORDER — ASPIRIN EC 81 MG PO TBEC: 81.0000 mg | DELAYED_RELEASE_TABLET | Freq: Every day | ORAL | Status: DC

## 2016-02-19 NOTE — Progress Notes (Signed)
Pre visit review using our clinic review tool, if applicable. No additional management support is needed unless otherwise documented below in the visit note. 

## 2016-02-19 NOTE — Patient Instructions (Signed)
Please start the Aspirin 81 mg per day (coated only)  Please continue all other medications as before, and refills have been done if requested.  Please have the pharmacy call with any other refills you may need.  Please continue your efforts at being more active, low cholesterol diet, and weight control.  You are otherwise up to date with prevention measures today.  Please keep your appointments with your specialists as you may have planned  Please return in 1 year for your yearly visit, or sooner if needed, with Lab testing done 3-5 days before

## 2016-02-19 NOTE — Progress Notes (Signed)
Subjective:    Patient ID: Bradley Gilbert, male    DOB: 1951/11/14, 65 y.o.   MRN: [id number]  HPI  Here for wellness and f/u;  Overall doing ok;  Pt denies Chest pain, worsening SOB, DOE, wheezing, orthopnea, PND, worsening LE edema, palpitations, dizziness or syncope.  Pt denies neurological change such as new headache, facial or extremity weakness.  Pt denies polydipsia, polyuria, or low sugar symptoms. Pt states overall good compliance with treatment and medications, good tolerability, and has been trying to follow appropriate diet.  Pt denies worsening depressive symptoms, suicidal ideation or panic. No fever, night sweats, wt loss, loss of appetite, or other constitutional symptoms.  Pt states good ability with ADL's, has low fall risk, home safety reviewed and adequate, no other significant changes in hearing or vision, and only occasionally active with exercise.   Past Medical History  Diagnosis Date  . Hearing loss   . OA (osteoarthritis)     KNEES AND LEFT SHOULDER  . Left rotator cuff tear   . Allergic rhinitis   . History of colon polyps     BENGIN  . Coronary artery disease     Cath 08/15/11 with 30% mid LAD, o/w no CAD   Past Surgical History  Procedure Laterality Date  . Laparoscopic inguinal hernia repair Right 09-05-2011  . 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 Transthoracic echocardiogram  08-19-2011    MILD LVH/  EF [id number]  GRADE I DIASTOLIC DYSFUNCTION  . Knee arthroscopy Right YRS AGO  . Shoulder arthroscopy with subacromial decompression and bicep tendon repair Left 05/12/2014    Procedure: LEFT ARTHROSCOPY SHOULDER WITH EXAM UNDER ANESTHESIA,SUBACROMIAL DECOMPRESSION DISTAL CLAVICAL RESECTION LABIAL REPAIR VERSES DEBRIDEMENT;  Surgeon: Sydnee Cabal, MD;  Location: [facility];   Service: Orthopedics;  Laterality: Left;  . Carpal tunnel release Right 1990  . Colonoscopy    . Polypectomy      reports that he has never smoked. He has never used smokeless tobacco. He reports that he does not drink alcohol or use illicit drugs. family history includes COPD in his father; Cancer in his father; Diabetes in his mother; Heart disease in his father. There is no history of Colon cancer, Prostate cancer, Rectal cancer, or Stomach cancer. Allergies  Allergen Reactions  . Shellfish Allergy Swelling    ALL TYPES --- Eyes swell , severe headache   No current outpatient prescriptions on file prior to visit.   No current facility-administered medications on file prior to visit.   Review of Systems Constitutional: Negative for increased diaphoresis, or other activity, appetite or siginficant weight change other than noted HENT: Negative for worsening hearing loss, ear pain, facial swelling, mouth sores and neck stiffness.   Eyes: Negative for other worsening pain, redness or visual disturbance.  Respiratory: Negative for choking or stridor Cardiovascular: Negative for other chest pain and palpitations.  Gastrointestinal: Negative for worsening diarrhea, blood in stool, or abdominal distention Genitourinary: Negative for hematuria, flank pain or change in urine volume.  Musculoskeletal: Negative for myalgias or other joint complaints.  Skin: Negative for other color change and wound or drainage.  Neurological: Negative for syncope and numbness. other than noted Hematological: Negative for adenopathy. or other swelling Psychiatric/Behavioral: Negative for hallucinations, SI, self-injury, decreased concentration or other worsening  agitation.      Objective:   Physical Exam BP 130/78 mmHg  Pulse 62  Temp(Src) 98.4 F (36.9 C) (Oral)  Resp 20  Wt 198 lb (89.812 kg)  SpO2 96% VS noted,  Constitutional: Pt is oriented to person, place, and time. Appears well-developed and  well-nourished, in no significant distress Head: Normocephalic and atraumatic  Eyes: Conjunctivae and EOM are normal. Pupils are equal, round, and reactive to light Right Ear: External ear normal.  Left Ear: External ear normal Nose: Nose normal.  Mouth/Throat: Oropharynx is clear and moist  Neck: Normal range of motion. Neck supple. No JVD present. No tracheal deviation present or significant neck LA or mass Cardiovascular: Normal rate, regular rhythm, normal heart sounds and intact distal pulses.   Pulmonary/Chest: Effort normal and breath sounds without rales or wheezing  Abdominal: Soft. Bowel sounds are normal. NT. No HSM  Musculoskeletal: Normal range of motion. Exhibits no edema Lymphadenopathy: Has no cervical adenopathy.  Neurological: Pt is alert and oriented to person, place, and time. Pt has normal reflexes. No cranial nerve deficit. Motor grossly intact Skin: Skin is warm and dry. No rash noted or new ulcers Psychiatric:  Has normal mood and affect. Behavior is normal.   ECG - NSR with NSSTTW changes    Assessment & Plan:

## 2016-02-22 NOTE — Assessment & Plan Note (Signed)

## 2017-04-28 ENCOUNTER — Encounter (HOSPITAL_COMMUNITY): Payer: Self-pay | Admitting: Emergency Medicine

## 2017-04-28 ENCOUNTER — Observation Stay (HOSPITAL_COMMUNITY)
Admission: EM | Admit: 2017-04-28 | Discharge: 2017-04-29 | Disposition: A | Discharge status: Home / Self Care | Payer: Medicare Other | Attending: Internal Medicine | Admitting: Internal Medicine

## 2017-04-28 ENCOUNTER — Encounter: Payer: Self-pay | Admitting: Physician Assistant

## 2017-04-28 ENCOUNTER — Emergency Department (HOSPITAL_COMMUNITY): Payer: Medicare Other

## 2017-04-28 DIAGNOSIS — I2510 Atherosclerotic heart disease of native coronary artery without angina pectoris: Secondary | ICD-10-CM | POA: Diagnosis not present

## 2017-04-28 DIAGNOSIS — R42 Dizziness and giddiness: Secondary | ICD-10-CM

## 2017-04-28 DIAGNOSIS — R778 Other specified abnormalities of plasma proteins: Secondary | ICD-10-CM | POA: Diagnosis present

## 2017-04-28 DIAGNOSIS — Z7982 Long term (current) use of aspirin: Secondary | ICD-10-CM | POA: Diagnosis not present

## 2017-04-28 DIAGNOSIS — I4891 Unspecified atrial fibrillation: Secondary | ICD-10-CM | POA: Diagnosis not present

## 2017-04-28 DIAGNOSIS — R748 Abnormal levels of other serum enzymes: Secondary | ICD-10-CM | POA: Diagnosis not present

## 2017-04-28 DIAGNOSIS — R7989 Other specified abnormal findings of blood chemistry: Secondary | ICD-10-CM | POA: Diagnosis present

## 2017-04-28 LAB — CBC WITH DIFFERENTIAL/PLATELET
BASOS PCT: 0 %
Basophils Absolute: 0 10*3/uL (ref 0.0–0.1)
EOS ABS: 0 10*3/uL (ref 0.0–0.7)
EOS PCT: 0 %
HCT: 47.5 % (ref 39.0–52.0)
Hemoglobin: 16.5 g/dL (ref 13.0–17.0)
LYMPHS ABS: 1.4 10*3/uL (ref 0.7–4.0)
Lymphocytes Relative: 14 %
MCH: 31.5 pg (ref 26.0–34.0)
MCHC: 34.7 g/dL (ref 30.0–36.0)
MCV: 90.8 fL (ref 78.0–100.0)
MONOS PCT: 4 %
Monocytes Absolute: 0.4 10*3/uL (ref 0.1–1.0)
NEUTROS PCT: 82 %
Neutro Abs: 8 10*3/uL — ABNORMAL HIGH (ref 1.7–7.7)
PLATELETS: 187 10*3/uL (ref 150–400)
RBC: 5.23 MIL/uL (ref 4.22–5.81)
RDW: 12.9 % (ref 11.5–15.5)
WBC: 9.8 10*3/uL (ref 4.0–10.5)

## 2017-04-28 LAB — TROPONIN I
TROPONIN I: 0.03 ng/mL — AB (ref <0.03)
Troponin I: <0.03 ng/mL (ref <0.03)

## 2017-04-28 LAB — BASIC METABOLIC PANEL
ANION GAP: 8 (ref 5–15)
BUN: 17 mg/dL (ref 6–20)
CALCIUM: 9.3 mg/dL (ref 8.9–10.3)
CO2: 24 mmol/L (ref 22–32)
CREATININE: 1.08 mg/dL (ref 0.61–1.24)
Chloride: 107 mmol/L (ref 101–111)
Glucose, Bld: 137 mg/dL — ABNORMAL HIGH (ref 65–99)
Potassium: 3.8 mmol/L (ref 3.5–5.1)
Sodium: 139 mmol/L (ref 135–145)

## 2017-04-28 LAB — MAGNESIUM: MAGNESIUM: 2 mg/dL (ref 1.7–2.4)

## 2017-04-28 MED ORDER — ONDANSETRON HCL 4 MG PO TABS: 4.0000 mg | ORAL_TABLET | Freq: Four times a day (QID) | ORAL | Status: DC | PRN

## 2017-04-28 MED ORDER — METOPROLOL TARTRATE 25 MG PO TABS
25.0000 mg | ORAL_TABLET | Freq: Two times a day (BID) | ORAL | Status: DC
  Administered 2017-04-29: 25 mg via ORAL
  Filled 2017-04-28: qty 1

## 2017-04-28 MED ORDER — SODIUM CHLORIDE 0.9 % IV BOLUS (SEPSIS)
1000.0000 mL | Freq: Once | INTRAVENOUS | Status: AC
  Administered 2017-04-28: 1000 mL via INTRAVENOUS

## 2017-04-28 MED ORDER — ONDANSETRON HCL 4 MG/2ML IJ SOLN: 4.0000 mg | Freq: Four times a day (QID) | INTRAMUSCULAR | Status: DC | PRN

## 2017-04-28 MED ORDER — ONDANSETRON HCL 4 MG/2ML IJ SOLN
4.0000 mg | Freq: Once | INTRAMUSCULAR | Status: AC
Start: 2017-04-28 — End: 2017-04-28
  Administered 2017-04-28: 4 mg via INTRAVENOUS
  Filled 2017-04-28: qty 2

## 2017-04-28 MED ORDER — APIXABAN 5 MG PO TABS
5.0000 mg | ORAL_TABLET | Freq: Two times a day (BID) | ORAL | Status: DC
  Administered 2017-04-29: 5 mg via ORAL
  Filled 2017-04-28: qty 1

## 2017-04-28 MED ORDER — ONDANSETRON HCL 4 MG/2ML IJ SOLN
4.0000 mg | Freq: Once | INTRAMUSCULAR | Status: AC
  Administered 2017-04-28: 4 mg via INTRAVENOUS
  Filled 2017-04-28: qty 2

## 2017-04-28 MED ORDER — ASPIRIN 81 MG PO CHEW
324.0000 mg | CHEWABLE_TABLET | Freq: Once | ORAL | Status: AC
  Administered 2017-04-28: 324 mg via ORAL
  Filled 2017-04-28: qty 4

## 2017-04-28 MED ORDER — PROMETHAZINE HCL 25 MG RE SUPP: 25.0000 mg | Freq: Four times a day (QID) | RECTAL | Status: DC | PRN

## 2017-04-28 MED ORDER — POTASSIUM CHLORIDE IN NACL 20-0.9 MEQ/L-% IV SOLN
INTRAVENOUS | Status: AC
  Administered 2017-04-28: via INTRAVENOUS
  Filled 2017-04-28: qty 1000

## 2017-04-28 MED ORDER — PROMETHAZINE HCL 25 MG PO TABS: 25.0000 mg | ORAL_TABLET | Freq: Four times a day (QID) | ORAL | Status: DC | PRN

## 2017-04-28 MED ORDER — PROMETHAZINE HCL 25 MG/ML IJ SOLN
12.5000 mg | Freq: Four times a day (QID) | INTRAMUSCULAR | Status: DC | PRN
  Administered 2017-04-29: 12.5 mg via INTRAVENOUS
  Filled 2017-04-28: qty 1

## 2017-04-28 MED ORDER — APIXABAN 5 MG PO TABS
5.0000 mg | ORAL_TABLET | Freq: Once | ORAL | Status: AC
  Administered 2017-04-28: 5 mg via ORAL
  Filled 2017-04-28: qty 1

## 2017-04-28 NOTE — ED Notes (Signed)
Troponin 0.03 per Main Lab

## 2017-04-28 NOTE — ED Provider Notes (Signed)
[facility] DEPT Provider Note   CSN: [id number] Arrival date & time: 04/28/17  1457     History   Chief Complaint No chief complaint on file.   HPI Bradley Gilbert is a 66 y.o. male.  HPI  66 year old male presents with dizziness and vomiting. He was sent from his PCPs office. Patient states that he did typical work [place]. However he did not feel really bad or notice any overexertion. This morning when he first woke around 5 AM he was dizzy. He has been dizzy all day. It seems get worse at times. It feels like he's very off balance. He has had multiple episodes of vomiting. He was sent from his PCPs office by EMS. He was noticed to be in atrial fibrillation with RVR, which to him as a new diagnosis. The patient states that he has not had any palpitations, chest pain, shortness of breath. He has also not had headache or weakness or numbness. Currently his dizziness is gone. Before I walked into the room he had an episode of vomiting in the ED and the nurse states that he converted from A. fib into normal rhythm. Patient currently just feels fatigued.  Was given 6 mg and 12 mg adenosine by EMS with no relief.  Past Medical History:  Diagnosis Date  . Allergic rhinitis   . Coronary artery disease    Cath 08/15/11 with 30% mid LAD, o/w no CAD  . Hearing loss   . History of colon polyps    BENGIN  . Left rotator cuff tear   . OA (osteoarthritis)    KNEES AND LEFT SHOULDER    Patient Active Problem List   Diagnosis Date Noted  . Elevated troponin 04/28/2017  . Atrial fibrillation with RVR ([place]) 04/28/2017  . Vertigo 04/28/2017  . Cough 01/01/2016  . Wheezing 01/01/2016  . Abnormal breath sounds 01/01/2016  . Tick bite of lower leg 06/12/2015  . Skin lesion of right arm 06/12/2015  . Routine general medical examination at a health care facility 09/12/2014  . CAD (coronary artery disease) 08/20/2011  . Hyperlipidemia 08/20/2011  .  OSTEOARTHRITIS, KNEE 01/03/2011    Past Surgical History:  Procedure Laterality Date  . 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 CARPAL TUNNEL RELEASE Right 1990  . COLONOSCOPY    . KNEE ARTHROSCOPY Right YRS AGO  . LAPAROSCOPIC INGUINAL HERNIA REPAIR Right 09-05-2011  . POLYPECTOMY    . SHOULDER ARTHROSCOPY WITH SUBACROMIAL DECOMPRESSION AND BICEP TENDON REPAIR Left 05/12/2014   Procedure: LEFT ARTHROSCOPY SHOULDER WITH EXAM UNDER ANESTHESIA,SUBACROMIAL DECOMPRESSION DISTAL CLAVICAL RESECTION LABIAL REPAIR VERSES DEBRIDEMENT;  Surgeon: Sydnee Cabal, MD;  Location: [facility];  Service: Orthopedics;  Laterality: Left;  . TRANSTHORACIC ECHOCARDIOGRAM  08-19-2011   MILD LVH/  EF 78-93%/  GRADE I DIASTOLIC DYSFUNCTION       Home Medications    Prior to Admission medications   Medication Sig Start Date End Date Taking? Authorizing Provider  aspirin EC 81 MG tablet Take 1 tablet (81 mg total) by mouth daily. 02/19/16  Yes Biagio Borg, MD  ibuprofen (ADVIL,MOTRIN) 200 MG tablet Take 400 mg by mouth every 6 (six) hours as needed for mild pain.   Yes [provider]  Multiple Vitamin (MULTIVITAMIN  WITH MINERALS) TABS tablet Take 1 tablet by mouth daily.   Yes [provider]    Family History Family History  Problem Relation Age of Onset  . Diabetes Mother   . Heart disease Father   . Cancer Father        throat  . COPD Father        throat  . Colon cancer Neg Hx   . Prostate cancer Neg Hx   . Rectal cancer Neg Hx   . Stomach cancer Neg Hx     Social History Social History  Substance Use Topics  . Smoking status: Never Smoker  . Smokeless tobacco: Never Used  . Alcohol use No     Allergies   Shellfish allergy   Review of Systems Review of Systems  Constitutional:  Positive for fatigue. Negative for fever.  Respiratory: Negative for shortness of breath.   Cardiovascular: Negative for chest pain and palpitations.  Gastrointestinal: Positive for nausea and vomiting. Negative for abdominal pain and diarrhea.  Neurological: Positive for dizziness. Negative for weakness, numbness and headaches.  All other systems reviewed and are negative.    Physical Exam Updated Vital Signs BP 128/71 (BP Location: Right Arm)   Pulse 72   Temp 98.4 F (36.9 C) (Oral)   Resp 18   Ht 5\' 10"  (1.778 m)   Wt 86.5 kg (190 lb 9.6 oz)   SpO2 99%   BMI 27.35 kg/m   Physical Exam  Constitutional: He is oriented to person, place, and time. He appears well-developed and well-nourished. No distress.  HENT:  Head: Normocephalic and atraumatic.  Right Ear: External ear normal.  Left Ear: External ear normal.  Nose: Nose normal.  Mouth/Throat: Mucous membranes are dry.  Eyes: EOM are normal. Pupils are equal, round, and reactive to light. Right eye exhibits no discharge. Left eye exhibits no discharge.  Neck: Neck supple.  Cardiovascular: Normal rate, regular rhythm and normal heart sounds.   No murmur heard. Pulmonary/Chest: Effort normal and breath sounds normal.  Abdominal: Soft. There is no tenderness.  Musculoskeletal: He exhibits no edema.  Neurological: He is alert and oriented to person, place, and time.  CN 3-12 grossly intact. 5/5 strength in all 4 extremities. Grossly normal sensation. Normal finger to nose.   Skin: Skin is warm and dry. He is not diaphoretic.  Nursing note and vitals reviewed.    ED Treatments / Results  Labs (all labs ordered are listed, but only abnormal results are displayed) Labs Reviewed  BASIC METABOLIC PANEL - Abnormal; Notable for the following:       Result Value   Glucose, Bld 137 (*)    All other components within normal limits  CBC WITH DIFFERENTIAL/PLATELET - Abnormal; Notable for the following:    Neutro Abs 8.0 (*)      All other components within normal limits  TROPONIN I - Abnormal; Notable for the following:    Troponin I 0.03 (*)    All other components within normal limits  TROPONIN I  MAGNESIUM  CBC  BASIC METABOLIC PANEL  TROPONIN I  TROPONIN I  TSH  LIPID PANEL    EKG  EKG Interpretation  Date/Time:  Tuesday April 28 2017 14:58:19 EDT Ventricular Rate:  143 PR Interval:    QRS Duration: 140 QT Interval:  352 QTC Calculation: 543 R Axis:   24 Text Interpretation:  Atrial fibrillation with RVR Ventricular premature complex Left bundle branch block afib and LBBB new since 2012 Confirmed  by Sherwood Gambler [phone]) on 04/28/2017 3:04:10 PM       EKG Interpretation  Date/Time:  Tuesday April 28 2017 15:06:41 EDT Ventricular Rate:  91 PR Interval:    QRS Duration: 145 QT Interval:  420 QTC Calculation: 517 R Axis:   34 Text Interpretation:  Sinus rhythm Left bundle branch block Afib and RVR resolved Confirmed by Sherwood Gambler [phone]) on 04/28/2017 3:21:36 PM        EKG Interpretation  Date/Time:  Tuesday April 28 2017 21:26:31 EDT Ventricular Rate:  72 PR Interval:    QRS Duration: 142 QT Interval:  427 QTC Calculation: 468 R Axis:   32 Text Interpretation:  Sinus rhythm Left bundle branch block no significant change since earlier in the day Confirmed by Sherwood Gambler [phone]) on 04/29/2017 12:05:52 AM       Radiology Dg Chest Portable 1 View  Result Date: 04/28/2017 CLINICAL DATA:  Dizziness with vomiting EXAM: PORTABLE CHEST 1 VIEW COMPARISON:  08/15/2011 FINDINGS: The heart size and mediastinal contours are within normal limits. Both lungs are clear. The visualized skeletal structures are unremarkable. IMPRESSION: No active disease. Electronically Signed   By: Donavan Foil M.D.   On: 04/28/2017 15:48    Procedures Procedures (including critical care time)  Medications Ordered in ED Medications  apixaban (ELIQUIS) tablet 5 mg (not administered)  metoprolol tartrate  (LOPRESSOR) tablet 25 mg (not administered)  ondansetron (ZOFRAN) tablet 4 mg (not administered)    Or  ondansetron (ZOFRAN) injection 4 mg (not administered)  0.9 % NaCl with KCl 20 mEq/ L  infusion (not administered)  promethazine (PHENERGAN) tablet 25 mg (not administered)    Or  promethazine (PHENERGAN) injection 12.5 mg (not administered)    Or  promethazine (PHENERGAN) suppository 25 mg (not administered)  sodium chloride 0.9 % bolus 1,000 mL (0 mLs Intravenous Stopped 04/28/17 1641)  ondansetron (ZOFRAN) injection 4 mg (4 mg Intravenous Given 04/28/17 1545)  apixaban (ELIQUIS) tablet 5 mg (5 mg Oral Given 04/28/17 1931)  ondansetron (ZOFRAN) injection 4 mg (4 mg Intravenous Given 04/28/17 2138)  aspirin chewable tablet 324 mg (324 mg Oral Given 04/28/17 2202)     Initial Impression / Assessment and Plan / ED Course  I have reviewed the triage vital signs and the nursing notes.  Pertinent labs & imaging results that were available during my care of the patient were reviewed by me and considered in my medical decision making (see chart for details).  Clinical Course as of Apr 30 3  Tue Apr 28, 2017  1519 Patient currently is in a normal sinus rhythm. He feels diffusely fatigued but he states the dizziness and nausea/vomiting is gone. We'll continue to observe to see if the A. fib comes back or if this was darkly causing the dizziness. His neuro exam right now is unremarkable. No focal findings.  [SG]  1728 Given he had afib that converted, will place on Eliquis per Dr Oval Linsey. Otherwise he is stable for d/c with close cardiology f/u (she will arrange). Will get delta troponin.  [SG]    Clinical Course User Index [SG] Sherwood Gambler, MD    Given patient's continued fatigue, second troponin was sent. Now it is. Minimally elevated at 0.03. This is not appear to represent MI but is concerning. Probably rate related more than anything else. Currently he is out of H her fibrillation and has  no RVR. However given these factors and now he is having recurrent nausea and vomiting, will admit for  observation with the hospitalist.  CHA2DS2/VAS Stroke Risk Points: 1 point (age)    Final Clinical Impressions(s) / ED Diagnoses   Final diagnoses:  Atrial fibrillation with RVR [facility])    New Prescriptions Current Discharge Medication List       Sherwood Gambler, MD 04/29/17 0006

## 2017-04-28 NOTE — ED Triage Notes (Signed)
Patient from primary care for with [facility] afib with RVR.  Patient states he woke up at 0500 this morning and felt severely dizzy and weak.  Went to primary care doctor's office, was sent to ED.  With [facility] patient received 6mg  adenosine with no effect, then 12mg  adenosine with momentary asystole and heart rate returned to 150-170.  Patient is alert and oriented, complains of nausea.  Arrives with 16g saline lock in left AC.  MD at bedside.

## 2017-04-28 NOTE — ED Notes (Signed)
Pt provided with ginger ale and saltine crackers.

## 2017-04-28 NOTE — Progress Notes (Signed)
Dr. Oval Linsey spoke to EDP re: this patient, requests OP f/u. I have sent a message to AF clinic nurse/scheduler requesting a follow-up appointment, and office will call the patient with this information. Reign Bartnick PA-C

## 2017-04-28 NOTE — ED Notes (Signed)
Portable at the bedside.  

## 2017-04-29 ENCOUNTER — Observation Stay (HOSPITAL_COMMUNITY): Payer: Medicare Other

## 2017-04-29 ENCOUNTER — Other Ambulatory Visit (HOSPITAL_COMMUNITY): Payer: Medicare Other

## 2017-04-29 ENCOUNTER — Observation Stay (HOSPITAL_BASED_OUTPATIENT_CLINIC_OR_DEPARTMENT_OTHER): Payer: Medicare Other

## 2017-04-29 DIAGNOSIS — R748 Abnormal levels of other serum enzymes: Secondary | ICD-10-CM | POA: Diagnosis not present

## 2017-04-29 DIAGNOSIS — I4891 Unspecified atrial fibrillation: Secondary | ICD-10-CM

## 2017-04-29 DIAGNOSIS — R42 Dizziness and giddiness: Secondary | ICD-10-CM | POA: Diagnosis not present

## 2017-04-29 DIAGNOSIS — I5030 Unspecified diastolic (congestive) heart failure: Secondary | ICD-10-CM

## 2017-04-29 LAB — BASIC METABOLIC PANEL
Anion gap: 7 (ref 5–15)
BUN: 15 mg/dL (ref 6–20)
CALCIUM: 8.8 mg/dL — AB (ref 8.9–10.3)
CO2: 26 mmol/L (ref 22–32)
Chloride: 106 mmol/L (ref 101–111)
Creatinine, Ser: 1.09 mg/dL (ref 0.61–1.24)
GFR calc Af Amer: >60 mL/min (ref >60)
GLUCOSE: 105 mg/dL — AB (ref 65–99)
POTASSIUM: 4.1 mmol/L (ref 3.5–5.1)
SODIUM: 139 mmol/L (ref 135–145)

## 2017-04-29 LAB — TSH: TSH: 0.821 u[IU]/mL (ref 0.350–4.500)

## 2017-04-29 LAB — LIPID PANEL
CHOL/HDL RATIO: 3.4 ratio
CHOLESTEROL: 151 mg/dL (ref 0–200)
HDL: 44 mg/dL (ref >40)
LDL Cholesterol: 84 mg/dL (ref 0–99)
TRIGLYCERIDES: 117 mg/dL (ref <150)
VLDL: 23 mg/dL (ref 0–40)

## 2017-04-29 LAB — CBC
HCT: 42.1 % (ref 39.0–52.0)
Hemoglobin: 14.3 g/dL (ref 13.0–17.0)
MCH: 31 pg (ref 26.0–34.0)
MCHC: 34 g/dL (ref 30.0–36.0)
MCV: 91.1 fL (ref 78.0–100.0)
PLATELETS: 175 10*3/uL (ref 150–400)
RBC: 4.62 MIL/uL (ref 4.22–5.81)
RDW: 13.1 % (ref 11.5–15.5)
WBC: 10.8 10*3/uL — AB (ref 4.0–10.5)

## 2017-04-29 LAB — ECHOCARDIOGRAM COMPLETE
Height: 70 in
Weight: 3049.6 oz

## 2017-04-29 LAB — TROPONIN I
TROPONIN I: 0.03 ng/mL — AB (ref <0.03)
Troponin I: 0.03 ng/mL (ref <0.03)

## 2017-04-29 MED ORDER — APIXABAN 5 MG PO TABS: 5.0000 mg | ORAL_TABLET | Freq: Two times a day (BID) | ORAL | 0 refills | Status: DC

## 2017-04-29 NOTE — Progress Notes (Signed)
Per insurance check for Eliquis # 4  S/W EVA @ [facility] # [phone]   1.ELIQUIS 2.5 MG BID  COVER- YES  CO-PAY- $ 47.00  TIER- 3 DRUG  PRIOR APPROVAL- NO    2. ELIQUIS 5 MG BIG  COVER- YES  CO-PAY- $ 47.00  TIER- 3 DRUG  PRIOR APPROVAL- NO   MAIL-ORDER FOR 90 DAY SUPPLY $ 94.00  PHARMACY : [facility] [address], [facility]

## 2017-04-29 NOTE — Progress Notes (Signed)
Spoke with ECHO lab regarding Echo pending discharge, they are not sure if it will be able to be done today, will call back.  Cyndia Bent RN

## 2017-04-29 NOTE — H&P (Signed)
History and Physical  Patient Name: Bradley Gilbert     PTW:[id number]    DOB: 24-May-1951    DOA: 04/28/2017 PCP: Josetta Huddle, MD   Patient coming from: Home  Chief Complaint: Vertigo, vomiting  HPI: Bradley Gilbert is a 66 y.o. male with past medical history significant for mild CAD who presents with 1 day vertigo, vomiting, found to have Afib.  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 Her walk and noticed vertigo which was intense and associated with vomiting. This persisted all day he felt malaise/nausea with repeated vomiting and vertigo with any standing or movement, ataxic gait because of vertigo, and intermittent diaphoresis, and finally went to his PCPs office where he was sent to the emergency room because of rapid heart rate.  There was no chest pain, chest discomfort, dyspnea, nausea.  There was no focal weakness, slurred speech, facial droop.      ED course: -Afebrile, heart rate 150, respirations 21, blood pressure 122/75 -Na 139, K 3.8, Cr 1.08, WBC 9.8 K, Hgb 16.5, magnesium 2.0 -Initial troponin negative -Chest x-ray showed no focal opacity -Initial ECG showed rate 143, atrial fibrillation with LBBB -He spontaneously converted to sinus rhythm in the ER; case was discussed with Cardiology who recommended Eliquis and outpatient cardiology follow up -A subsequent repeat troponin increased to 0.03 and so TRH were asked to observe for serial enzymes   He has had issues with vertigo in the past.  He has no personal history of stroke.  He had a false positive stress test in 2012, was admitted shortly after with accelerating chest pain and had cardiac catheterization that showed congenitally absent left main, less than 30% LAD stenosis only, was started on metoprolol, Crestor and aspirin 81 mg, none of which he takes any more.  Only had one Cardiology follow  up afterwards.  Never diagnosed with congestive heart failure, hypertension, diabetes, or peripheral vascular disease. Family history notable for myocardial infarction in his father at advanced age, unspecified heart problems in his mother, no arrhythmia, no stroke.          ROS: Review of Systems  Constitutional: Positive for diaphoresis and malaise/fatigue.  Eyes: Negative for blurred vision and double vision.  Respiratory: Negative for shortness of breath.   Cardiovascular: Negative for chest pain and palpitations.  Gastrointestinal: Positive for nausea and vomiting.  Musculoskeletal: Negative for falls.  Neurological: Positive for dizziness (vertigo, and ataxia) and headaches. Negative for tingling, tremors, sensory change, speech change, focal weakness, seizures and loss of consciousness.  All other systems reviewed and are negative.         Past Medical History:  Diagnosis Date  . Allergic rhinitis   . Coronary artery disease    Cath 08/15/11 with 30% mid LAD, o/w no CAD  . Hearing loss   . History of colon polyps    BENGIN  . Left rotator cuff tear   . OA (osteoarthritis)    KNEES AND LEFT SHOULDER    Past Surgical History:  Procedure Laterality Date  . 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 CARPAL TUNNEL RELEASE Right 1990  . COLONOSCOPY    . KNEE ARTHROSCOPY  Right YRS AGO  . LAPAROSCOPIC INGUINAL HERNIA REPAIR Right 09-05-2011  . POLYPECTOMY    . SHOULDER ARTHROSCOPY WITH SUBACROMIAL DECOMPRESSION AND BICEP TENDON REPAIR Left 05/12/2014   Procedure: LEFT ARTHROSCOPY SHOULDER WITH EXAM UNDER ANESTHESIA,SUBACROMIAL DECOMPRESSION DISTAL CLAVICAL RESECTION LABIAL REPAIR VERSES DEBRIDEMENT;  Surgeon: Sydnee Cabal, MD;  Location: [facility];  Service: Orthopedics;  Laterality:  Left;  . TRANSTHORACIC ECHOCARDIOGRAM  08-19-2011   MILD LVH/  EF 94-70%/  GRADE I DIASTOLIC DYSFUNCTION    Social History: Patient lives with his wife.  The patient walks unassisted.  Never smoker. From [place] originally. Worked for [employer], now retired.  Allergies  Allergen Reactions  . Shellfish Allergy Anaphylaxis    Throat, face, and Eyes swell , severe headache    Family history: family history includes COPD in his father; Cancer in his father; Diabetes in his mother; Heart disease in his father.  Prior to Admission medications   Medication Sig Start Date End Date Taking? Authorizing Provider  aspirin EC 81 MG tablet Take 1 tablet (81 mg total) by mouth daily. 02/19/16  Yes Biagio Borg, MD  ibuprofen (ADVIL,MOTRIN) 200 MG tablet Take 400 mg by mouth every 6 (six) hours as needed for mild pain.   Yes [provider]  Multiple Vitamin (MULTIVITAMIN WITH MINERALS) TABS tablet Take 1 tablet by mouth daily.   Yes [provider]       Physical Exam: BP 128/71 (BP Location: Right Arm)   Pulse 72   Temp 98.4 F (36.9 C) (Oral)   Resp 18   Ht 5\' 10"  (1.778 m)   Wt 86.5 kg (190 lb 9.6 oz)   SpO2 99%   BMI 27.35 kg/m  General appearance: Well-developed, older adult male, alert and in moderate distress from nausea.   Eyes: Anicteric, conjunctiva pink, lids and lashes normal. PERRL.    ENT: No nasal deformity, discharge, epistaxis.  Hearing normal. OP moist without lesions.   Neck: No neck masses.  Trachea midline.  No thyromegaly/tenderness. Lymph: No cervical or supraclavicular lymphadenopathy. Skin: Warm and dry.  No jaundice.  No suspicious rashes or lesions. Cardiac: RRR, nl S1-S2, no murmurs appreciated.  Capillary refill is brisk.  JVP normal.  No LE edema.  Radial and DP pulses 2+ and symmetric. Respiratory: Normal respiratory rate and rhythm.  CTAB without rales or wheezes. Abdomen: Abdomen soft.  No TTP. No ascites, distension,  hepatosplenomegaly.   MSK: No deformities or effusions.  No cyanosis or clubbing. Neuro: Pupils are 3 mm and reactive to 2 mm.  Extraocular movements are intact, without nystagmus.  Cranial nerve 5 is within normal limits.  Cranial nerve 7 is symmetrical.  Cranial nerve 8 is within normal limits.  Cranial nerves 9 and 10 reveal equal palate elevation.  Cranial nerve 11 reveals sternocleidomastoid strong.  Cranial nerve 12 is midline.  Motor strength testing is 5/5 in the upper and lower extremities bilaterally with normal motor, tone and bulk. Sensory examination is intact to light touch.  Romberg maneuver is negative for pathology.  No pronator drift.  Finger-to-nose testing is equal. The patient is oriented to time, place and person.  Speech is fluent.  Naming is grossly intact.  Recall, recent and remote, as well as general fund of knowledge seem within normal limits.  Attention span and concentration are within normal limits. Psych: Sensorium intact and responding to questions, attention normal.  Behavior appropriate.  Affect normal.  Judgment and insight appear normal.  Labs on Admission:  I have personally reviewed following labs and imaging studies: CBC:  Recent Labs Lab 04/28/17 1557  WBC 9.8  NEUTROABS 8.0*  HGB 16.5  HCT 47.5  MCV 90.8  PLT 962   Basic Metabolic Panel:  Recent Labs Lab 04/28/17 1557  NA 139  K 3.8  CL 107  CO2 24  GLUCOSE 137*  BUN 17  CREATININE 1.08  CALCIUM 9.3  MG 2.0   GFR: Estimated Creatinine Clearance: 68.5 mL/min (by C-G formula based on SCr of 1.08 mg/dL).  Liver Function Tests: No results for input(s): AST, ALT, ALKPHOS, BILITOT, PROT, ALBUMIN in the last 168 hours. No results for input(s): LIPASE, AMYLASE in the last 168 hours. No results for input(s): AMMONIA in the last 168 hours. Coagulation Profile: No results for input(s): INR, PROTIME in the last 168 hours. Cardiac Enzymes:  Recent Labs Lab 04/28/17 1557 04/28/17 1937    TROPONINI <0.03 0.03*   BNP (last 3 results) No results for input(s): PROBNP in the last 8760 hours. HbA1C: No results for input(s): HGBA1C in the last 72 hours. CBG: No results for input(s): GLUCAP in the last 168 hours. Lipid Profile: No results for input(s): CHOL, HDL, LDLCALC, TRIG, CHOLHDL, LDLDIRECT in the last 72 hours. Thyroid Function Tests: No results for input(s): TSH, T4TOTAL, FREET4, T3FREE, THYROIDAB in the last 72 hours. Anemia Panel: No results for input(s): VITAMINB12, FOLATE, FERRITIN, TIBC, IRON, RETICCTPCT in the last 72 hours. Sepsis Labs:  Invalid input(s): PROCALCITONIN, LACTICIDVEN No results found for this or any previous visit (from the past 240 hour(s)).       Radiological Exams on Admission: Personally reviewed CXR shows increased interstitial markings, no focal opacities: Dg Chest Portable 1 View  Result Date: 04/28/2017 CLINICAL DATA:  Dizziness with vomiting EXAM: PORTABLE CHEST 1 VIEW COMPARISON:  08/15/2011 FINDINGS: The heart size and mediastinal contours are within normal limits. Both lungs are clear. The visualized skeletal structures are unremarkable. IMPRESSION: No active disease. Electronically Signed   By: Donavan Foil M.D.   On: 04/28/2017 15:48    EKG: Independently reviewed. Rate 143, atrial fibrillation, LBBB. Repeat ECG shows sinus rhythm, rate 72, LBBB, old since 2017   Echocardiogram 2012: Report reviewed EF 60% Grade I DD Normal valves         Assessment/Plan  1. Elevated troponin:  Mild troponin leak in setting of A. fib versus early ACS.   -Serial enzymes ordered   2. Atrial fibrillation:  New onset.  CHADS2-VASc 1 for age. -Continue Eliquis per cardiology -Obtain echocardiogram -Check TSH -Start metoprolol 25 twice a day -Outpatient Cardiology follow arranged during previous shift -K>4, mag>2  3. Vertigo: Worse by far than his previous (peripheral) vertigo.  Given new A. fib, will rule out central  cause. -MR brain ordered     DVT prophylaxis: N/A  Code Status: FULL  Family Communication: Wife  Disposition Plan: Anticipate serial enzymes overnight, if negative, Cardiology follow up as outpaitent. Consults called: Cardiology Admission status: OBS At the point of initial evaluation, it is my clinical opinion that admission for OBSERVATION is reasonable and necessary because the patient's presenting complaints in the context of their chronic conditions represent sufficient risk of deterioration or significant morbidity to constitute reasonable grounds for close observation in the hospital setting, but that the patient may be medically stable for discharge from the hospital within 24 to 48 hours.    Medical decision making: Patient seen at 10:00 PM on 04/28/2017.  The patient was  discussed with Dr. Regenia Skeeter.  What exists of the patient's chart was reviewed in depth and summarized above.  Clinical condition: stable.        Edwin Dada [facility] Pager [phone]

## 2017-04-29 NOTE — Care Management Obs Status (Signed)
[facility] NOTIFICATION   Patient Details  Name: Bradley Gilbert MRN: [id number] Date of Birth: 1951/09/29   Medicare Observation Status Notification Given:  Yes    Carles Collet, RN 04/29/2017, 1:41 PM

## 2017-04-29 NOTE — Discharge Summary (Signed)
Physician Discharge Summary  Bradley Gilbert:[id number] DOB: 04/17/1951 DOA: 04/28/2017  PCP: Josetta Huddle, MD  Admit date: 04/28/2017 Discharge date: 04/29/2017   Recommendations for Outpatient Follow-Up:   1. Final echo reading pending (machine at hospital not working currently for reads) 2. Outpatient referral to cardiology (a fib clinic)   Discharge Diagnosis:   Principal Problem:   Elevated troponin Active Problems:   Atrial fibrillation with RVR ([place])   Vertigo   Discharge disposition:  Home.  Discharge Condition: Improved.  Diet recommendation: Low sodium, heart healthy  Wound care: None.   History of Present Illness:   Bradley Gilbert is a 66 y.o. male with past medical history significant for mild CAD who presents with 1 day vertigo, vomiting, found to have Afib.  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 Her walk and noticed vertigo which was intense and associated with vomiting. This persisted all day he felt malaise/nausea with repeated vomiting and vertigo with any standing or movement, ataxic gait because of vertigo, and intermittent diaphoresis, and finally went to his PCPs office where he was sent to the emergency room because of rapid heart rate.  There was no chest pain, chest discomfort, dyspnea, nausea.  There was no focal weakness, slurred speech, facial droop   Hospital Course by Problem:    Elevated troponin:  Mild troponin leak in setting of A. fib versus early ACS.   -flat   Atrial fibrillation:  New onset.  CHADS2-VASc 1 for age. -Continue Eliquis per cardiology - echocardiogram read pending - TSH ok -HR will not tolerate BB-- defer to cards (in 50s-60s here sinus) -Outpatient Cardiology follow arranged  -keep K>4, mag>2   Vertigo: -MR brain negative -suspect due to dehydration    Medical Consultants:      Cards (phone)   Discharge Exam:   Vitals:   04/29/17 0400 04/29/17 0938  BP: 116/68   Pulse: (!) 59 69  Resp: 18   Temp: 97.8 F (36.6 C)    Vitals:   04/28/17 2230 04/28/17 2331 04/29/17 0400 04/29/17 0938  BP: 118/74 128/71 116/68   Pulse: 73 72 (!) 59 69  Resp:  18 18   Temp:  98.4 F (36.9 C) 97.8 F (36.6 C)   TempSrc:  Oral Oral   SpO2: 96% 99% 98%   Weight:  86.5 kg (190 lb 9.6 oz)    Height:  5\' 10"  (1.778 m)      Gen:  NAD    The results of significant diagnostics from this hospitalization (including imaging, microbiology, ancillary and laboratory) are listed below for reference.     Procedures and Diagnostic Studies:   Mr Brain Wo Contrast  Result Date: 04/29/2017 CLINICAL DATA:  Initial evaluation for acute vertigo. EXAM: MRI HEAD WITHOUT CONTRAST TECHNIQUE: Multiplanar, multiecho pulse sequences of the brain and surrounding structures were obtained without intravenous contrast. COMPARISON:  Prior CT from 01/23/2014. FINDINGS: Brain: Cerebral volume within normal limits for age. No focal parenchymal signal abnormality identified. No significant cerebral white matter disease. No abnormal foci of restricted diffusion to suggest acute or subacute ischemia. Gray-white matter differentiation well maintained. No evidence for chronic infarction. No evidence for acute or chronic intracranial hemorrhage. Small linear focus of susceptibility at the posterior right parietal lobe noted, likely vascular, and of doubtful significance. No mass lesion, midline shift or mass effect.  Ventricles normal size without evidence for hydrocephalus. No extra-axial fluid collection. Major dural sinuses are grossly patent. Pituitary gland suprasellar region within normal limits. Midline structures intact and normal. Vascular: Major intracranial vascular flow voids are maintained. Skull and upper cervical spine: Craniocervical junction normal. Visualized upper cervical spine unremarkable.  Bone marrow signal intensity within normal limits. No scalp soft tissue abnormality. Sinuses/Orbits: Globes and orbital soft tissues within normal limits. Mild scattered mucosal thickening within the ethmoidal air cells. Paranasal sinuses are otherwise clear. No mastoid effusion. Inner ear structures normal. Other: None. IMPRESSION: Normal MRI of the brain. Electronically Signed   By: Jeannine Boga M.D.   On: 04/29/2017 01:09   Dg Chest Portable 1 View  Result Date: 04/28/2017 CLINICAL DATA:  Dizziness with vomiting EXAM: PORTABLE CHEST 1 VIEW COMPARISON:  08/15/2011 FINDINGS: The heart size and mediastinal contours are within normal limits. Both lungs are clear. The visualized skeletal structures are unremarkable. IMPRESSION: No active disease. Electronically Signed   By: Donavan Foil M.D.   On: 04/28/2017 15:48     Labs:   Basic Metabolic Panel:  Recent Labs Lab 04/28/17 1557 04/29/17 0519  NA 139 139  K 3.8 4.1  CL 107 106  CO2 24 26  GLUCOSE 137* 105*  BUN 17 15  CREATININE 1.08 1.09  CALCIUM 9.3 8.8*  MG 2.0  --    GFR Estimated Creatinine Clearance: 67.9 mL/min (by C-G formula based on SCr of 1.09 mg/dL). Liver Function Tests: No results for input(s): AST, ALT, ALKPHOS, BILITOT, PROT, ALBUMIN in the last 168 hours. No results for input(s): LIPASE, AMYLASE in the last 168 hours. No results for input(s): AMMONIA in the last 168 hours. Coagulation profile No results for input(s): INR, PROTIME in the last 168 hours.  CBC:  Recent Labs Lab 04/28/17 1557 04/29/17 0519  WBC 9.8 10.8*  NEUTROABS 8.0*  --   HGB 16.5 14.3  HCT 47.5 42.1  MCV 90.8 91.1  PLT 187 175   Cardiac Enzymes:  Recent Labs Lab 04/28/17 1557 04/28/17 1937 04/28/17 2355 04/29/17 0519  TROPONINI <0.03 0.03* 0.03* 0.03*   BNP: Invalid input(s): POCBNP CBG: No results for input(s): GLUCAP in the last 168 hours. D-Dimer No results for input(s): DDIMER in the last 72 hours. Hgb  A1c No results for input(s): HGBA1C in the last 72 hours. Lipid Profile  Recent Labs  04/29/17 0519  CHOL 151  HDL 44  LDLCALC 84  TRIG 117  CHOLHDL 3.4   Thyroid function studies  Recent Labs  04/28/17 2355  TSH 0.821   Anemia work up No results for input(s): VITAMINB12, FOLATE, FERRITIN, TIBC, IRON, RETICCTPCT in the last 72 hours. Microbiology No results found for this or any previous visit (from the past 240 hour(s)).   Discharge Instructions:   Discharge Instructions    Amb referral to AFIB Clinic    Complete by:  As directed    Diet - low sodium heart healthy    Complete by:  As directed    Discharge instructions    Complete by:  As directed    Stay hydrated when working outside   Increase activity slowly    Complete by:  As directed      Allergies as of 04/29/2017      Reactions   Shellfish Allergy Anaphylaxis   Throat, face, and Eyes swell , severe headache      Medication List    STOP taking these medications   aspirin EC 81 MG  tablet   ibuprofen 200 MG tablet Commonly known as:  ADVIL,MOTRIN     TAKE these medications   apixaban 5 MG Tabs tablet Commonly known as:  ELIQUIS Take 1 tablet (5 mg total) by mouth 2 (two) times daily.   multivitamin with minerals Tabs tablet Take 1 tablet by mouth daily.      Follow-up Information    Josetta Huddle, MD Follow up in 1 week(s).   Specialty:  Internal Medicine Why:  referral made to the atril fibrillation clinic Contact information: [address] [place]  [address] [phone]            Time coordinating discharge: 25 min  Signed:  Raistlin Gum Alison Stalling   [facility] 04/29/2017, 5:44 PM

## 2017-04-29 NOTE — Progress Notes (Signed)
Discharged per MD order. AVS went over including follow up appointments and medications. Patient to home with family. Cyndia Bent RN

## 2017-04-29 NOTE — Discharge Instructions (Signed)

## 2017-05-04 ENCOUNTER — Ambulatory Visit (HOSPITAL_COMMUNITY)
Admission: RE | Admit: 2017-05-04 | Discharge: 2017-05-04 | Disposition: A | Discharge status: Home / Self Care | Payer: Medicare Other | Source: Ambulatory Visit | Attending: Nurse Practitioner | Admitting: Nurse Practitioner

## 2017-05-04 ENCOUNTER — Encounter (HOSPITAL_COMMUNITY): Payer: Self-pay | Admitting: Nurse Practitioner

## 2017-05-04 VITALS — BP 152/90 | HR 79 | Ht 70.0 in | Wt 192.6 lb

## 2017-05-04 DIAGNOSIS — I447 Left bundle-branch block, unspecified: Secondary | ICD-10-CM | POA: Insufficient documentation

## 2017-05-04 DIAGNOSIS — I2510 Atherosclerotic heart disease of native coronary artery without angina pectoris: Secondary | ICD-10-CM | POA: Diagnosis not present

## 2017-05-04 DIAGNOSIS — I4891 Unspecified atrial fibrillation: Secondary | ICD-10-CM

## 2017-05-04 DIAGNOSIS — Z7901 Long term (current) use of anticoagulants: Secondary | ICD-10-CM | POA: Diagnosis not present

## 2017-05-04 DIAGNOSIS — Z91013 Allergy to seafood: Secondary | ICD-10-CM | POA: Insufficient documentation

## 2017-05-04 DIAGNOSIS — I480 Paroxysmal atrial fibrillation: Secondary | ICD-10-CM | POA: Insufficient documentation

## 2017-05-04 DIAGNOSIS — Z79899 Other long term (current) drug therapy: Secondary | ICD-10-CM | POA: Insufficient documentation

## 2017-05-04 MED ORDER — DILTIAZEM HCL 30 MG PO TABS: ORAL_TABLET | ORAL | 2 refills | Status: DC

## 2017-05-04 NOTE — Patient Instructions (Signed)
Your physician has recommended you make the following change in your medication:  1)Cardizem 30mg  -- take 1 tablet every 4 hours AS NEEDED for afib heart rate >100    Scheduler will be in touch with you in regards to sleep study

## 2017-05-05 ENCOUNTER — Telehealth: Payer: Self-pay | Admitting: *Deleted

## 2017-05-05 DIAGNOSIS — I4891 Unspecified atrial fibrillation: Secondary | ICD-10-CM

## 2017-05-05 NOTE — Telephone Encounter (Signed)
Informed patient of upcoming sleep study and patient understanding was verbalized. Patient understands his sleep study is scheduled for July 03 2017. Patient understands hs sleep study will be done at [facility] sleep lab. Patient understands he will receive a sleep packet in a week or so. Patient understands to call if he does not receive the sleep packet in a timely manner. Patient agrees with treatment and thanked me for call

## 2017-05-05 NOTE — Telephone Encounter (Signed)
-----   Message from Juluis Mire, RN sent at 05/04/2017  2:55 PM EDT ----- Regarding: sleep study  Pt needs sleep study for afib. Thanks stacy

## 2017-05-05 NOTE — Progress Notes (Signed)
Primary Care Physician: Josetta Huddle, MD Referring Physician: [facility] hospital f/u   Bradley Gilbert is a 66 y.o. male with a h/o mild cath by cath in 2012,  That presented to the ER with dizziness and subsequent diaphoresis and repetitive vomiting.  He presented to his PCP's office and was sent to the ER. He was found to be in afib with rvr.He was found to have a mild flat troponin leak thought to be from rvr. He was found to have significant vertigo with ataxia. He states that his symptoms improved with IV hydration. He does work outside a lot and had been in the heat a lot the previous day. He is in SR today. He was placed on apixaban 5 mg bid for chadsvasc score of 1 for age. Echo showed normal LV size with EF 55-60%.He has not had any further afib or vertigo.   Today, he denies symptoms of palpitations, chest pain, shortness of breath, orthopnea, PND, lower extremity edema, dizziness, presyncope, syncope, or neurologic sequela. The patient is tolerating medications without difficulties and is otherwise without complaint today.   Past Medical History:  Diagnosis Date  . Allergic rhinitis   . Coronary artery disease    Cath 08/15/11 with 30% mid LAD, o/w no CAD  . Hearing loss   . History of colon polyps    BENGIN  . Left rotator cuff tear   . OA (osteoarthritis)    KNEES AND LEFT SHOULDER   Past Surgical History:  Procedure Laterality Date  . 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 CARPAL TUNNEL RELEASE Right 1990  . COLONOSCOPY    . KNEE ARTHROSCOPY Right YRS AGO  . LAPAROSCOPIC INGUINAL HERNIA REPAIR Right 09-05-2011  . POLYPECTOMY    . SHOULDER ARTHROSCOPY WITH SUBACROMIAL DECOMPRESSION AND BICEP TENDON REPAIR Left 05/12/2014   Procedure: LEFT ARTHROSCOPY SHOULDER WITH EXAM UNDER ANESTHESIA,SUBACROMIAL  DECOMPRESSION DISTAL CLAVICAL RESECTION LABIAL REPAIR VERSES DEBRIDEMENT;  Surgeon: Sydnee Cabal, MD;  Location: [facility];  Service: Orthopedics;  Laterality: Left;  . TRANSTHORACIC ECHOCARDIOGRAM  08-19-2011   MILD LVH/  EF 41-96%/  GRADE I DIASTOLIC DYSFUNCTION    Current Outpatient Prescriptions  Medication Sig Dispense Refill  . apixaban (ELIQUIS) 5 MG TABS tablet Take 1 tablet (5 mg total) by mouth 2 (two) times daily. 60 tablet 0  . Multiple Vitamin (MULTIVITAMIN WITH MINERALS) TABS tablet Take 1 tablet by mouth daily.    Marland Kitchen diltiazem (CARDIZEM) 30 MG tablet Take 1 tablet every 4 hours AS NEEDED for afib heart rate >100 45 tablet 2   No current facility-administered medications for this encounter.     Allergies  Allergen Reactions  . Shellfish Allergy Anaphylaxis    Throat, face, and Eyes swell , severe headache    Social History   Social History  . Marital status: Married    Spouse name: N/A  . Number of children: N/A  . Years of education: N/A   Occupational History  . 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 Smoking status: Never Smoker  . Smokeless tobacco: Never Used  . Alcohol use No  . Drug use: No  . Sexual activity: Not on file   Other Topics Concern  . Not on file   Social History Narrative  Graduate of [employer].  Married - '85.  1 son - '70,  2 daughters - '74, '87;   5 grandchildren.  Hobbies - keeps horses and ponies.  Marriage - good health             Family History  Problem Relation Age of Onset  . Diabetes Mother   . Heart disease Father   . Cancer Father        throat  . COPD Father        throat  . Colon cancer Neg Hx   . Prostate cancer Neg Hx   . Rectal cancer Neg Hx   . Stomach cancer Neg Hx     ROS- All systems are reviewed and negative except as per the HPI above  Physical Exam: Vitals:   05/04/17 1353  BP: (!) 152/90  Pulse: 79  Weight: 192 lb 9.6 oz (87.4 kg)    Height: 5\' 10"  (1.778 m)   Wt Readings from Last 3 Encounters:  05/04/17 192 lb 9.6 oz (87.4 kg)  04/28/17 190 lb 9.6 oz (86.5 kg)  02/19/16 198 lb (89.8 kg)    Labs: Lab Results  Component Value Date   NA 139 04/29/2017   K 4.1 04/29/2017   CL 106 04/29/2017   CO2 26 04/29/2017   GLUCOSE 105 (H) 04/29/2017   BUN 15 04/29/2017   CREATININE 1.09 04/29/2017   CALCIUM 8.8 (L) 04/29/2017   MG 2.0 04/28/2017   Lab Results  Component Value Date   INR 1.18 08/15/2011   Lab Results  Component Value Date   CHOL 151 04/29/2017   HDL 44 04/29/2017   LDLCALC 84 04/29/2017   TRIG 117 04/29/2017     GEN- The patient is well appearing, alert and oriented x 3 today.   Head- normocephalic, atraumatic Eyes-  Sclera clear, conjunctiva pink Ears- hearing intact Oropharynx- clear Neck- supple, no JVP Lymph- no cervical lymphadenopathy Lungs- Clear to ausculation bilaterally, normal work of breathing Heart- Regular rate and rhythm, no murmurs, rubs or gallops, PMI not laterally displaced GI- soft, NT, ND, + BS Extremities- no clubbing, cyanosis, or edema MS- no significant deformity or atrophy Skin- no rash or lesion Psych- euthymic mood, full affect Neuro- strength and sensation are intact  EKG-NSR with NSIV block,t wave abnormality, pr int 166 ms, qrs int 134 ms, qtc 474 ms Epic records reviewed Echo-Impressions:  - Normal LV size with EF 55-60%. Moderate diastolic dysfunction.   Normal RV size and systolic function. No significant valvular   abnormalities.   Assessment and Plan: 1. New onset paroxysmal afib General education re afib, triggers and management Very unusual presentation for afib and am thinking that the vertigo with subsequent multiple episodes of vomiting may have triggered afib Will not place on daily rate control as he has a heart rate in the 50's a lot of the time at home Will RX cardizem 30 mg as needed for elevated heart rate,if it should reoccur BP  is elevated today but this is not usually the cause, it is usually normal, pt states he is anxious about the visit He will finish the 30 days of eliquis but do not anticipate long term anticoagulation with chadsvasc score of 1 Bleeding precautions reviewed Wife states that he does snore with witnessed apnea and will order a sleep study  afib clinic as needed F/u with PCP  Butch Penny C. Yesly Gerety, [facility] [address] [place], [place] [address] [phone]

## 2017-07-03 ENCOUNTER — Ambulatory Visit (HOSPITAL_BASED_OUTPATIENT_CLINIC_OR_DEPARTMENT_OTHER): Payer: Medicare Other | Attending: Nurse Practitioner | Admitting: Cardiology

## 2017-07-03 VITALS — Ht 71.0 in | Wt 195.0 lb

## 2017-07-03 DIAGNOSIS — R5383 Other fatigue: Secondary | ICD-10-CM | POA: Diagnosis present

## 2017-07-03 DIAGNOSIS — R0683 Snoring: Secondary | ICD-10-CM | POA: Insufficient documentation

## 2017-07-03 DIAGNOSIS — I454 Nonspecific intraventricular block: Secondary | ICD-10-CM | POA: Diagnosis not present

## 2017-07-03 DIAGNOSIS — I4891 Unspecified atrial fibrillation: Secondary | ICD-10-CM

## 2017-07-04 NOTE — Procedures (Signed)
   Patient Name: Bradley Gilbert, [place] Date: 07/03/2017 Gender: Male D.O.B: [birth year] Age (years): 65 Referring Provider: Sherran Needs Height (inches): 68 Interpreting Physician: Fransico Him MD, ABSM Weight (lbs): 195 RPSGT: Baxter Flattery BMI: 27 MRN: [id number] Neck Size: 16.00  CLINICAL INFORMATION Sleep Study Type: NPSG  Indication for sleep study: Fatigue, Snoring  Epworth Sleepiness Score: 2  SLEEP STUDY TECHNIQUE As per the AASM Manual for the Scoring of Sleep and Associated Events v2.3 (April 2016) with a hypopnea requiring 4% desaturations.  The channels recorded and monitored were frontal, central and occipital EEG, electrooculogram (EOG), submentalis EMG (chin), nasal and oral airflow, thoracic and abdominal wall motion, anterior tibialis EMG, snore microphone, electrocardiogram, and pulse oximetry.  MEDICATIONS Medications self-administered by patient taken the night of the study : N/A  SLEEP ARCHITECTURE The study was initiated at 10:18:06 PM and ended at 5:10:25 AM.  Sleep onset time was 9.0 minutes and the sleep efficiency was 83.3%. The total sleep time was 343.5 minutes.  Stage REM latency was 108.0 minutes.  The patient spent 5.68% of the night in stage N1 sleep, 69.43% in stage N2 sleep, 0.00% in stage N3 and 24.89% in REM.  Alpha intrusion was absent.  Supine sleep was 31.07%.  RESPIRATORY PARAMETERS The overall apnea/hypopnea index (AHI) was 2.6 per hour. There were 7 total apneas, including 7 obstructive, 0 central and 0 mixed apneas. There were 8 hypopneas and 8 RERAs.  The AHI during Stage REM sleep was 0.7 per hour.  AHI while supine was 7.3 per hour.  The mean oxygen saturation was 94.34%. The minimum SpO2 during sleep was 88.00%.  Soft snoring was noted during this study.  CARDIAC DATA The 2 lead EKG demonstrated sinus rhythm. The mean heart rate was 55.04 beats per minute. Other EKG findings: There was occasional BBB  noted.  LEG MOVEMENT DATA The total PLMS were 0 with a resulting PLMS index of 0.00. Associated arousal with leg movement index was 0.0 .  IMPRESSIONS - No significant obstructive sleep apnea occurred during this study (AHI = 2.6/h). - No significant central sleep apnea occurred during this study (CAI = 0.0/h). - Mild oxygen desaturation was noted during this study (Min O2 = 88.00%). - The patient snored with Soft snoring volume. - No cardiac abnormalities were noted during this study. - Clinically significant periodic limb movements did not occur during sleep. No significant associated arousals.  DIAGNOSIS - Normal study - NSR with intermittent IVCD  RECOMMENDATIONS - Avoid alcohol, sedatives and other CNS depressants that may worsen sleep apnea and disrupt normal sleep architecture. - Sleep hygiene should be reviewed to assess factors that may improve sleep quality. - Weight management and regular exercise should be initiated or continued if appropriate.   [place], American Board of Sleep Medicine  ELECTRONICALLY SIGNED ON:  07/04/2017, 7:53 PM [place] PH: [phone]   FX: [phone] [place]

## 2017-07-08 ENCOUNTER — Telehealth: Payer: Self-pay | Admitting: *Deleted

## 2017-07-08 NOTE — Telephone Encounter (Signed)
-----   Message from Sueanne Margarita, MD sent at 07/04/2017  7:55 PM EDT ----- Please let patient know that sleep study showed no significant sleep apnea.

## 2017-07-08 NOTE — Telephone Encounter (Signed)
LMTCB

## 2017-07-09 NOTE — Telephone Encounter (Signed)
Informed patient of sleep study results and patient understanding was verbalized. Patient understands his sleep study showed no sleep apnea. Patient understands there a no further appointments at this time. Patient was grateful for the call and thanked me.

## 2017-11-23 ENCOUNTER — Telehealth (HOSPITAL_COMMUNITY): Payer: Self-pay | Admitting: *Deleted

## 2017-11-23 NOTE — Telephone Encounter (Signed)
Pt cld with some general questions for Roderic Palau, NP about his afib and what would be acceptable ranges for HR and BP.  Pt does not know if he is out of rhythm but feels his HR increases too fast day to day and has been trying to track this with an apple watch.  Pt was given an appt to come in for EKG and visit to determine if afib or not. Pt is not taking his blood thinner any longer.  Pt would like a call back from [place].

## 2017-11-25 ENCOUNTER — Ambulatory Visit (HOSPITAL_COMMUNITY)
Admission: RE | Admit: 2017-11-25 | Discharge: 2017-11-25 | Disposition: A | Discharge status: Home / Self Care | Payer: Medicare Other | Source: Ambulatory Visit | Attending: Nurse Practitioner | Admitting: Nurse Practitioner

## 2017-11-25 ENCOUNTER — Encounter (HOSPITAL_COMMUNITY): Payer: Self-pay | Admitting: Nurse Practitioner

## 2017-11-25 VITALS — BP 142/78 | HR 75 | Ht 71.0 in | Wt 203.6 lb

## 2017-11-25 DIAGNOSIS — R009 Unspecified abnormalities of heart beat: Secondary | ICD-10-CM

## 2017-11-25 DIAGNOSIS — Z7901 Long term (current) use of anticoagulants: Secondary | ICD-10-CM | POA: Diagnosis not present

## 2017-11-25 DIAGNOSIS — M19012 Primary osteoarthritis, left shoulder: Secondary | ICD-10-CM | POA: Insufficient documentation

## 2017-11-25 DIAGNOSIS — M170 Bilateral primary osteoarthritis of knee: Secondary | ICD-10-CM | POA: Diagnosis not present

## 2017-11-25 DIAGNOSIS — I2510 Atherosclerotic heart disease of native coronary artery without angina pectoris: Secondary | ICD-10-CM | POA: Insufficient documentation

## 2017-11-25 DIAGNOSIS — I480 Paroxysmal atrial fibrillation: Secondary | ICD-10-CM | POA: Insufficient documentation

## 2017-11-25 DIAGNOSIS — R030 Elevated blood-pressure reading, without diagnosis of hypertension: Secondary | ICD-10-CM

## 2017-11-25 DIAGNOSIS — I4891 Unspecified atrial fibrillation: Secondary | ICD-10-CM | POA: Diagnosis present

## 2017-11-25 DIAGNOSIS — Z79899 Other long term (current) drug therapy: Secondary | ICD-10-CM | POA: Diagnosis not present

## 2017-11-25 DIAGNOSIS — I447 Left bundle-branch block, unspecified: Secondary | ICD-10-CM | POA: Insufficient documentation

## 2017-11-25 DIAGNOSIS — Z91013 Allergy to seafood: Secondary | ICD-10-CM | POA: Insufficient documentation

## 2017-11-25 NOTE — Progress Notes (Signed)
Primary Care Physician: Josetta Huddle, MD Referring Physician: [facility] hospital f/u   Bradley Gilbert is a 67 y.o. male with a h/o mild CAD by cath in 2012,  that presented to the ER with dizziness and subsequent diaphoresis and repetitive vomiting.  He presented to his PCP's office and was sent to the ER. He was found to be in afib with rvr.He was found to have a mild flat troponin leak thought to be from rvr. He was found to have significant vertigo with ataxia. He states that his symptoms improved with IV hydration. He does work outside a lot and had been in the heat a lot the previous day.  He was placed on apixaban 5 mg bid for chadsvasc score of 1 for age, thought to be able to stop after 30 days.. Echo showed normal LV size with EF 55-60%.He has not had any further afib or vertigo.  F/u in afib clinic at pt request 1/2. He checks his heart rate many times a day on his Apple watch but does not have ability to check a rhythm strip like with a Kardia. He has noted a couple elevated heart rates that would not be explained with activity. But when elevated checking heart rate back to back, it lasted less than 30 secs. He did not feel different with this. He had a sleep study in August that did not show any apnea.  Today, he denies symptoms of palpitations, chest pain, shortness of breath, orthopnea, PND, lower extremity edema, dizziness, presyncope, syncope, or neurologic sequela. The patient is tolerating medications without difficulties and is otherwise without complaint today.   Past Medical History:  Diagnosis Date  . Allergic rhinitis   . Coronary artery disease    Cath 08/15/11 with 30% mid LAD, o/w no CAD  . Hearing loss   . History of colon polyps    BENGIN  . Left rotator cuff tear   . OA (osteoarthritis)    KNEES AND LEFT SHOULDER   Past Surgical History:  Procedure Laterality Date  . CARDIAC CATHETERIZATION  08-15-2011  DR BENSIMHON   FALSE POSTIVE STRESS TEST--  MINIMAL  NON-BSTRUCTIVE CAD ,  mLAD 30%, LM congenitally absent (there was seprate ostia for the lad & the lcfx)/   NORMAL LVF  . CARPAL TUNNEL RELEASE Right 1990  . COLONOSCOPY    . KNEE ARTHROSCOPY Right YRS AGO  . LAPAROSCOPIC INGUINAL HERNIA REPAIR Right 09-05-2011  . POLYPECTOMY    . SHOULDER ARTHROSCOPY WITH SUBACROMIAL DECOMPRESSION AND BICEP TENDON REPAIR Left 05/12/2014   Procedure: LEFT ARTHROSCOPY SHOULDER WITH EXAM UNDER ANESTHESIA,SUBACROMIAL DECOMPRESSION DISTAL CLAVICAL RESECTION LABIAL REPAIR VERSES DEBRIDEMENT;  Surgeon: Sydnee Cabal, MD;  Location: [place];  Service: Orthopedics;  Laterality: Left;  . TRANSTHORACIC ECHOCARDIOGRAM  08-19-2011   MILD LVH/  EF 79-02%/  GRADE I DIASTOLIC DYSFUNCTION    Current Outpatient Medications  Medication Sig Dispense Refill  . Multiple Vitamin (MULTIVITAMIN WITH MINERALS) TABS tablet Take 1 tablet by mouth daily.    Marland Kitchen apixaban (ELIQUIS) 5 MG TABS tablet Take 1 tablet (5 mg total) by mouth 2 (two) times daily. (Patient not taking: Reported on 11/25/2017) 60 tablet 0  . diltiazem (CARDIZEM) 30 MG tablet Take 1 tablet every 4 hours AS NEEDED for afib heart rate >100 (Patient not taking: Reported on 11/25/2017) 45 tablet 2   No current facility-administered medications for this encounter.     Allergies  Allergen Reactions  . Shellfish Allergy Anaphylaxis  Throat, face, and Eyes swell , severe headache    Social History   Socioeconomic History  . Marital status: Married    Spouse name: Not on file  . Number of children: Not on file  . Years of education: Not on file  . Highest education level: Not on file  Social Needs  . Financial resource strain: Not on file  . Food insecurity - worry: Not on file  . Food insecurity - inability: Not on file  . Transportation needs - medical: Not on file  . Transportation needs - non-medical: Not on file  Occupational History  . Occupation: Diet Therapist, music: [employer]  [employer]  Tobacco Use  . Smoking status: Never Smoker  . Smokeless tobacco: Never Used  Substance and Sexual Activity  . Alcohol use: No    Alcohol/week: 0.0 oz  . Drug use: No  . Sexual activity: Not on file  Other Topics Concern  . Not on file  Social History Narrative   Graduate of [employer].  Married - '85.  1 son - '70,  2 daughters - '74, '87;   5 grandchildren.  Hobbies - keeps horses and ponies.  Marriage - good health             Family History  Problem Relation Age of Onset  . Diabetes Mother   . Heart disease Father   . Cancer Father        throat  . COPD Father        throat  . Colon cancer Neg Hx   . Prostate cancer Neg Hx   . Rectal cancer Neg Hx   . Stomach cancer Neg Hx     ROS- All systems are reviewed and negative except as per the HPI above  Physical Exam: Vitals:   11/25/17 1540  BP: (!) 142/78  Pulse: 75  Weight: 203 lb 9.6 oz (92.4 kg)  Height: 5\' 11"  (1.803 m)   Wt Readings from Last 3 Encounters:  11/25/17 203 lb 9.6 oz (92.4 kg)  07/03/17 195 lb (88.5 kg)  05/04/17 192 lb 9.6 oz (87.4 kg)    Labs: Lab Results  Component Value Date   NA 139 04/29/2017   K 4.1 04/29/2017   CL 106 04/29/2017   CO2 26 04/29/2017   GLUCOSE 105 (H) 04/29/2017   BUN 15 04/29/2017   CREATININE 1.09 04/29/2017   CALCIUM 8.8 (L) 04/29/2017   MG 2.0 04/28/2017   Lab Results  Component Value Date   INR 1.18 08/15/2011   Lab Results  Component Value Date   CHOL 151 04/29/2017   HDL 44 04/29/2017   LDLCALC 84 04/29/2017   TRIG 117 04/29/2017     GEN- The patient is well appearing, alert and oriented x 3 today.   Head- normocephalic, atraumatic Eyes-  Sclera clear, conjunctiva pink Ears- hearing intact Oropharynx- clear Neck- supple, no JVP Lymph- no cervical lymphadenopathy Lungs- Clear to ausculation bilaterally, normal work of breathing Heart- Regular rate and rhythm, no murmurs, rubs or gallops, PMI not laterally displaced GI-  soft, NT, ND, + BS Extremities- no clubbing, cyanosis, or edema MS- no significant deformity or atrophy Skin- no rash or lesion Psych- euthymic mood, full affect Neuro- strength and sensation are intact  EKG-NSR with LBBB, pr int 160 ms, qrs int 142 ms, qtc 471 ms Epic records reviewed Echo-Impressions:  - Normal LV size with EF 55-60%. Moderate diastolic dysfunction.   Normal RV size and systolic  function. No significant valvular   abnormalities.   Assessment and Plan: 1. New onset paroxysmal afib in June 2018, resolved I doubt what pt is seeing on I phone is afib, as he will see HR elevated only a few seconds and looking at this overall heart rate response on his I phone, I do not see trends that would make me think he is having afib He is reassured and encouraged to continue to track and call if he has further concerns, Jodelle Red may be helpful if he wants to be able to see heart tracing Vertigo with subsequent multiple episodes of vomiting may have triggered afib back in June Will not place on daily rate control as he has a heart rate in the 50's a lot of the time at home He has  cardizem 30 mg as needed for elevated heart rate but do not see any sustained HR that would warrant this No need for current anticoagulation with chadsvasc score of 1  Encouraged to call if further questions afib clinic as needed   Butch Penny C. Carroll, [facility] [address] [place], [place] [address] [phone]

## 2018-07-03 ENCOUNTER — Other Ambulatory Visit: Payer: Self-pay

## 2018-07-03 ENCOUNTER — Encounter (HOSPITAL_COMMUNITY): Payer: Self-pay | Admitting: Emergency Medicine

## 2018-07-03 ENCOUNTER — Observation Stay (HOSPITAL_COMMUNITY)
Admission: EM | Admit: 2018-07-03 | Discharge: 2018-07-04 | Disposition: A | Discharge status: Home / Self Care | Payer: Medicare Other | Attending: Cardiovascular Disease | Admitting: Cardiovascular Disease

## 2018-07-03 DIAGNOSIS — R42 Dizziness and giddiness: Secondary | ICD-10-CM | POA: Diagnosis not present

## 2018-07-03 DIAGNOSIS — Z91013 Allergy to seafood: Secondary | ICD-10-CM | POA: Diagnosis not present

## 2018-07-03 DIAGNOSIS — I10 Essential (primary) hypertension: Secondary | ICD-10-CM | POA: Diagnosis not present

## 2018-07-03 DIAGNOSIS — J984 Other disorders of lung: Secondary | ICD-10-CM | POA: Insufficient documentation

## 2018-07-03 DIAGNOSIS — Z7982 Long term (current) use of aspirin: Secondary | ICD-10-CM | POA: Insufficient documentation

## 2018-07-03 DIAGNOSIS — I4891 Unspecified atrial fibrillation: Secondary | ICD-10-CM | POA: Diagnosis present

## 2018-07-03 DIAGNOSIS — Z79899 Other long term (current) drug therapy: Secondary | ICD-10-CM | POA: Diagnosis not present

## 2018-07-03 DIAGNOSIS — H9190 Unspecified hearing loss, unspecified ear: Secondary | ICD-10-CM | POA: Diagnosis not present

## 2018-07-03 DIAGNOSIS — Z7901 Long term (current) use of anticoagulants: Secondary | ICD-10-CM | POA: Diagnosis not present

## 2018-07-03 DIAGNOSIS — I2510 Atherosclerotic heart disease of native coronary artery without angina pectoris: Secondary | ICD-10-CM | POA: Diagnosis not present

## 2018-07-03 DIAGNOSIS — I447 Left bundle-branch block, unspecified: Secondary | ICD-10-CM | POA: Diagnosis not present

## 2018-07-03 DIAGNOSIS — I480 Paroxysmal atrial fibrillation: Principal | ICD-10-CM | POA: Insufficient documentation

## 2018-07-03 DIAGNOSIS — Z86010 Personal history of colonic polyps: Secondary | ICD-10-CM | POA: Insufficient documentation

## 2018-07-03 DIAGNOSIS — Z9889 Other specified postprocedural states: Secondary | ICD-10-CM | POA: Diagnosis not present

## 2018-07-03 DIAGNOSIS — Z8249 Family history of ischemic heart disease and other diseases of the circulatory system: Secondary | ICD-10-CM | POA: Insufficient documentation

## 2018-07-03 DIAGNOSIS — R000 Tachycardia, unspecified: Secondary | ICD-10-CM | POA: Insufficient documentation

## 2018-07-03 DIAGNOSIS — M1990 Unspecified osteoarthritis, unspecified site: Secondary | ICD-10-CM | POA: Insufficient documentation

## 2018-07-03 NOTE — ED Triage Notes (Addendum)
Per EMS pt was at home tonight sitting on couch, stood up, and felt dizzy and "not right"  Hx of afib.  Took 1 cardizem as directed and went to the fire station for help. EMS arrival found HR 170 to 200.  Nausea and dizziness. Pt given 150 mg amiodorone and 8 mg of zofran as well as 450 mL of NS. HR down to 140s.  Alert and oriented.

## 2018-07-03 NOTE — ED Provider Notes (Addendum)
[facility] EMERGENCY DEPARTMENT Provider Note  CSN: [id number] Arrival date & time: 07/03/18 2338  Chief Complaint(s) Tachycardia  HPI Bradley Gilbert is a 67 y.o. male with a history of CAD, paroxysmal A. fib with a chads vas score of 2 who presents to the emergency department for lightheadedness and found to be in A. fib RVR by EMS.  Patient reports that he does not feel his heart racing or any palpitations.  Denies any associated chest pain or shortness of breath.  Does not know when his heart went into A. fib but reports he felt dizzy approximately 2 hours prior to arrival while getting up from the couch.  He denies any recent fevers or infections.  No nausea or vomiting.  No abdominal pain.  No lower extremity swelling.  No change in recent medications.  Patient had a diltiazem, pill in the pocket which he took.  Since he did not improve, EMS was called.  They noted that patient's heart rate was in the 160s and gave amiodarone following an EKG concerning for A. fib RVR.  They did note that the patient's heart rate improved.  They also noted that patient's blood pressure was mildly soft with systolics in the mid 35T.  This improved after 500 cc of IV fluid in the amiodarone.  Patient currently asymptomatic.  HPI  Past Medical History Past Medical History:  Diagnosis Date  . Allergic rhinitis   . Coronary artery disease    Cath 08/15/11 with 30% mid LAD, o/w no CAD  . Hearing loss   . History of colon polyps    BENGIN  . Left rotator cuff tear   . OA (osteoarthritis)    KNEES AND LEFT SHOULDER   Patient Active Problem List   Diagnosis Date Noted  . Elevated troponin 04/28/2017  . Atrial fibrillation with RVR ([place]) 04/28/2017  . Vertigo 04/28/2017  . Cough 01/01/2016  . Wheezing 01/01/2016  . Abnormal breath sounds 01/01/2016  . Tick bite of lower leg 06/12/2015  . Skin lesion of right arm 06/12/2015  . Routine general medical examination at a health care  facility 09/12/2014  . CAD (coronary artery disease) 08/20/2011  . Hyperlipidemia 08/20/2011  . OSTEOARTHRITIS, KNEE 01/03/2011   Home Medication(s) Prior to Admission medications   Medication Sig Start Date End Date Taking? Authorizing Provider  amLODipine (NORVASC) 2.5 MG tablet Take 2.5 mg by mouth daily.   Yes [provider]  aspirin 81 MG chewable tablet Chew 81 mg by mouth daily.   Yes [provider]  diltiazem (CARDIZEM) 30 MG tablet Take 1 tablet every 4 hours AS NEEDED for afib heart rate >100 05/04/17  Yes Sherran Needs, NP  valsartan (DIOVAN) 80 MG tablet Take 40 mg by mouth daily.   Yes [provider]  apixaban (ELIQUIS) 5 MG TABS tablet Take 1 tablet (5 mg total) by mouth 2 (two) times daily. Patient not taking: Reported on 11/25/2017 04/29/17   Geradine Girt, DO  Past Surgical History Past Surgical History:  Procedure Laterality Date  . 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 CARPAL TUNNEL RELEASE Right 1990  . COLONOSCOPY    . KNEE ARTHROSCOPY Right YRS AGO  . LAPAROSCOPIC INGUINAL HERNIA REPAIR Right 09-05-2011  . POLYPECTOMY    . SHOULDER ARTHROSCOPY WITH SUBACROMIAL DECOMPRESSION AND BICEP TENDON REPAIR Left 05/12/2014   Procedure: LEFT ARTHROSCOPY SHOULDER WITH EXAM UNDER ANESTHESIA,SUBACROMIAL DECOMPRESSION DISTAL CLAVICAL RESECTION LABIAL REPAIR VERSES DEBRIDEMENT;  Surgeon: Sydnee Cabal, MD;  Location: [place];  Service: Orthopedics;  Laterality: Left;  . TRANSTHORACIC ECHOCARDIOGRAM  08-19-2011   MILD LVH/  EF 63-78%/  GRADE I DIASTOLIC DYSFUNCTION   Family History Family History  Problem Relation Age of Onset  .  Diabetes Mother   . Heart disease Father   . Cancer Father        throat  . COPD Father        throat  . Colon cancer Neg Hx   . Prostate cancer Neg Hx   . Rectal cancer Neg Hx   . Stomach cancer Neg Hx     Social History Social History   Tobacco Use  . Smoking status: Never Smoker  . Smokeless tobacco: Never Used  Substance Use Topics  . Alcohol use: No    Alcohol/week: 0.0 standard drinks  . Drug use: No   Allergies Shellfish allergy  Review of Systems Review of Systems All other systems are reviewed and are negative for acute change except as noted in the HPI  Physical Exam Vital Signs  I have reviewed the triage vital signs BP 124/80   Pulse (!) 108   Resp 19   SpO2 98%   Physical Exam  Constitutional: He is oriented to person, place, and time. He appears well-developed and well-nourished. No distress.  HENT:  Head: Normocephalic and atraumatic.  Nose: Nose normal.  Eyes: Pupils are equal, round, and reactive to light. Conjunctivae and EOM are normal. Right eye exhibits no discharge. Left eye exhibits no discharge. No scleral icterus.  Neck: Normal range of motion. Neck supple.  Cardiovascular: An irregularly irregular rhythm present. Tachycardia present. Exam reveals no gallop and no friction rub.  No murmur heard. Pulmonary/Chest: Effort normal and breath sounds normal. No stridor. No respiratory distress. He has no rales.  Abdominal: Soft. He exhibits no distension. There is no tenderness.  Musculoskeletal: He exhibits no edema or tenderness.  Neurological: He is alert and oriented to person, place, and time.  Skin: Skin is warm and dry. No rash noted. He is not diaphoretic. No erythema.  Psychiatric: He has a normal mood and affect.  Vitals reviewed.   ED Results and Treatments Labs (all labs ordered are listed, but only abnormal results are displayed) Labs Reviewed  BASIC METABOLIC PANEL - Abnormal; Notable for the following components:       Result Value   Glucose, Bld 196 (*)    Calcium 8.7 (*)    All other components within normal limits  CBC  BRAIN NATRIURETIC PEPTIDE  I-STAT TROPONIN, ED  EKG  EKG Interpretation  Date/Time:  Saturday July 03 2018 23:46:24 EDT Ventricular Rate:  128 PR Interval:    QRS Duration: 138 QT Interval:  355 QTC Calculation: 519 R Axis:   60 Text Interpretation:  Atrial fibrillation Left bundle branch block Confirmed by Addison Lank [phone]) on 07/04/2018 12:47:55 AM      Radiology Dg Chest Port 1 View  Result Date: 07/04/2018 CLINICAL DATA:  Acute onset of dizziness. Tachycardia. Nausea. EXAM: PORTABLE CHEST 1 VIEW COMPARISON:  Chest radiograph performed 04/28/2017 FINDINGS: The lungs are well-aerated. Mild scarring is noted at the lung bases. The left costophrenic angle is incompletely imaged on this study. There is no evidence of significant focal opacification, pleural effusion or pneumothorax. The cardiomediastinal silhouette is within normal limits. No acute osseous abnormalities are seen. IMPRESSION: Mild scarring at the lung bases. Lungs otherwise clear. Electronically Signed   By: Garald Balding M.D.   On: 07/04/2018 00:15   Pertinent labs & imaging results that were available during my care of the patient were reviewed by me and considered in my medical decision making (see chart for details).  Medications Ordered in ED Medications  acetaminophen (TYLENOL) tablet 650 mg (has no administration in time range)  ondansetron (ZOFRAN) injection 4 mg (has no administration in time range)  apixaban (ELIQUIS) tablet 5 mg (has no administration in time range)  metoprolol tartrate (LOPRESSOR) tablet 25 mg (has no administration in time range)  sodium chloride 0.9 % bolus 500 mL (0 mLs Intravenous Stopped 07/04/18 0123)                                                                                                                                     Procedures Procedures CRITICAL CARE Performed by: Grayce Sessions Cardama Total critical care time: 30 minutes Critical care time was exclusive of separately billable procedures and treating other patients. Critical care was necessary to treat or prevent imminent or life-threatening deterioration. Critical care was time spent personally by me on the following activities: development of treatment plan with patient and/or surrogate as well as nursing, discussions with consultants, evaluation of patient's response to treatment, examination of patient, obtaining history from patient or surrogate, ordering and performing treatments and interventions, ordering and review of laboratory studies, ordering and review of radiographic studies, pulse oximetry and re-evaluation of patient's condition.   (including critical care time)  Medical Decision Making / ED Course I have reviewed the nursing notes for this encounter and the patient's prior records (if available in EHR or on provided paperwork).    A. fib RVR with rates between 90s and 120s.  Labs without anemia or significant electrolyte derangements.  Troponin negative.  Chest x-ray reassuring.  Patient given additional IV fluids and 5 mg of metoprolol.  Discussed case with cardiology who will admit the patient for continued management.  Final Clinical Impression(s) / ED Diagnoses Final diagnoses:  Tachycardia  Atrial  fibrillation with rapid ventricular response ([place])      This chart was dictated using voice recognition software.  Despite best efforts to proofread,  errors can occur which can change the documentation meaning.       Fatima Blank, MD 07/04/18 [phone]

## 2018-07-04 ENCOUNTER — Other Ambulatory Visit: Payer: Self-pay

## 2018-07-04 ENCOUNTER — Emergency Department (HOSPITAL_COMMUNITY): Payer: Medicare Other

## 2018-07-04 DIAGNOSIS — I10 Essential (primary) hypertension: Secondary | ICD-10-CM | POA: Diagnosis not present

## 2018-07-04 DIAGNOSIS — I4891 Unspecified atrial fibrillation: Secondary | ICD-10-CM | POA: Diagnosis not present

## 2018-07-04 DIAGNOSIS — R42 Dizziness and giddiness: Secondary | ICD-10-CM | POA: Diagnosis not present

## 2018-07-04 DIAGNOSIS — I2510 Atherosclerotic heart disease of native coronary artery without angina pectoris: Secondary | ICD-10-CM | POA: Diagnosis not present

## 2018-07-04 DIAGNOSIS — I480 Paroxysmal atrial fibrillation: Secondary | ICD-10-CM | POA: Diagnosis not present

## 2018-07-04 LAB — BASIC METABOLIC PANEL
Anion gap: 10 (ref 5–15)
BUN: 21 mg/dL (ref 8–23)
CALCIUM: 8.7 mg/dL — AB (ref 8.9–10.3)
CHLORIDE: 107 mmol/L (ref 98–111)
CO2: 22 mmol/L (ref 22–32)
CREATININE: 1.15 mg/dL (ref 0.61–1.24)
GFR calc Af Amer: >60 mL/min (ref >60)
GFR calc non Af Amer: >60 mL/min (ref >60)
Glucose, Bld: 196 mg/dL — ABNORMAL HIGH (ref 70–99)
Potassium: 4 mmol/L (ref 3.5–5.1)
SODIUM: 139 mmol/L (ref 135–145)

## 2018-07-04 LAB — CBC
HCT: 45.4 % (ref 39.0–52.0)
HEMOGLOBIN: 15.3 g/dL (ref 13.0–17.0)
MCH: 31.7 pg (ref 26.0–34.0)
MCHC: 33.7 g/dL (ref 30.0–36.0)
MCV: 94.2 fL (ref 78.0–100.0)
Platelets: 195 10*3/uL (ref 150–400)
RBC: 4.82 MIL/uL (ref 4.22–5.81)
RDW: 12.7 % (ref 11.5–15.5)
WBC: 8.6 10*3/uL (ref 4.0–10.5)

## 2018-07-04 LAB — I-STAT TROPONIN, ED: TROPONIN I, POC: 0 ng/mL (ref 0.00–0.08)

## 2018-07-04 LAB — BRAIN NATRIURETIC PEPTIDE: B Natriuretic Peptide: 30.1 pg/mL (ref 0.0–100.0)

## 2018-07-04 MED ORDER — METOPROLOL TARTRATE 25 MG PO TABS
25.0000 mg | ORAL_TABLET | Freq: Two times a day (BID) | ORAL | Status: DC
  Administered 2018-07-04: 25 mg via ORAL
  Filled 2018-07-04: qty 1

## 2018-07-04 MED ORDER — METOPROLOL TARTRATE 5 MG/5ML IV SOLN
5.0000 mg | Freq: Once | INTRAVENOUS | Status: DC
  Filled 2018-07-04: qty 5

## 2018-07-04 MED ORDER — APIXABAN 5 MG PO TABS: 5.0000 mg | ORAL_TABLET | Freq: Two times a day (BID) | ORAL | Status: DC

## 2018-07-04 MED ORDER — APIXABAN 5 MG PO TABS
5.0000 mg | ORAL_TABLET | Freq: Two times a day (BID) | ORAL | Status: DC
  Administered 2018-07-04 (×2): 5 mg via ORAL
  Filled 2018-07-04 (×2): qty 1

## 2018-07-04 MED ORDER — METOPROLOL TARTRATE 25 MG PO TABS: 25.0000 mg | ORAL_TABLET | Freq: Two times a day (BID) | ORAL | Status: DC

## 2018-07-04 MED ORDER — SODIUM CHLORIDE 0.9 % IV BOLUS
500.0000 mL | Freq: Once | INTRAVENOUS | Status: AC
Start: 2018-07-04 — End: 2018-07-04
  Administered 2018-07-04: 500 mL via INTRAVENOUS

## 2018-07-04 MED ORDER — ONDANSETRON HCL 4 MG/2ML IJ SOLN: 4.0000 mg | Freq: Four times a day (QID) | INTRAMUSCULAR | Status: DC | PRN

## 2018-07-04 MED ORDER — APIXABAN 5 MG PO TABS: 5.0000 mg | ORAL_TABLET | Freq: Two times a day (BID) | ORAL | 11 refills | Status: DC

## 2018-07-04 MED ORDER — ACETAMINOPHEN 325 MG PO TABS: 650.0000 mg | ORAL_TABLET | ORAL | Status: DC | PRN

## 2018-07-04 MED ORDER — METOPROLOL TARTRATE 25 MG PO TABS: 25.0000 mg | ORAL_TABLET | Freq: Two times a day (BID) | ORAL | 11 refills | Status: DC

## 2018-07-04 MED ORDER — METOPROLOL TARTRATE 25 MG PO TABS: 37.5000 mg | ORAL_TABLET | Freq: Two times a day (BID) | ORAL | Status: DC

## 2018-07-04 NOTE — Progress Notes (Signed)
Patient resting comfortably during shift report. Denies complaints.  

## 2018-07-04 NOTE — Discharge Summary (Signed)
Discharge Summary    Patient ID: Bradley Gilbert,  MRN: [id number], DOB/AGE: 69-Apr-1952 69 y.o.  Admit date: 07/03/2018 Discharge date: 07/04/2018  Primary Care Provider: Josetta Huddle Primary Cardiologist:  Followed in AFib Clinic  Discharge Diagnoses    Principal Problem:   Atrial fibrillation with rapid ventricular response [facility]) Active Problems:   Essential hypertension   Allergies Allergies  Allergen Reactions  . Shellfish Allergy Anaphylaxis    Throat, face, and Eyes swell , severe headache    Diagnostic Studies/Procedures    None  _____________   History of Present Illness     Bradley Gilbert is a 67 y.o. 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  He was given Amiodarone by EMS and transported to [facility] ED.    Hospital Course     Consultants:  None    Bradley Gilbert was admitted and observed on telemetry.  He was started on Apixaban and Metoprolol Tartrate.  His heart rate improved on beta-blocker therapy.  He remained in atrial fibrillation.  His CHADS2-VASc=2 (new dx of HTN, age x 1).  Therefore, he will need to remain on long term anticoagulation.  After his dose of Metoprolol this AM, he converted to NSR.  His HRs with ambulation have remained in the 60-70s.  He was evaluated today by Dr. Harl Bowie.  He is felt to be stable for DC to home.  He will need follow up in the AFib Clinic in the next 1-2 weeks for ongoing management.    _____________  Discharge Vitals Blood pressure 108/76, pulse 62, temperature (!) 97.3 F (36.3 C), temperature source Oral, resp. rate 18, height 5\' 10"  (1.778 m), weight 91 kg, SpO2 97 %.  Filed Weights   07/04/18 0329  Weight: 91 kg    Labs & Radiologic Studies    CBC Recent Labs   07/04/18 0000  WBC 8.6  HGB 15.3  HCT 45.4  MCV 94.2  PLT 616   Basic Metabolic Panel Recent Labs    07/04/18 0000  NA 139  K 4.0  CL 107  CO2 22  GLUCOSE 196*  BUN 21  CREATININE 1.15  CALCIUM 8.7*   _____________  Dg Chest Port 1 View  Result Date: 07/04/2018 CLINICAL DATA:  Acute onset of dizziness. Tachycardia. Nausea. EXAM: PORTABLE CHEST 1 VIEW COMPARISON:  Chest radiograph performed 04/28/2017 FINDINGS: The lungs are well-aerated. Mild scarring is noted at the lung bases. The left costophrenic angle is incompletely imaged on this study. There is no evidence of significant focal opacification, pleural effusion or pneumothorax. The cardiomediastinal silhouette is within normal limits. No acute osseous abnormalities are seen. IMPRESSION: Mild scarring at the lung bases. Lungs otherwise clear. Electronically Signed   By: Garald Balding M.D.   On: 07/04/2018 00:15   Disposition   Pt is being discharged home today in good condition.  Follow-up Plans & Appointments    Follow-up Information    [facility] Follow up in 2 week(s).   Specialty:  Cardiology Why:  The AFib Clinic will call you to arrange a follow up visit with Roderic Palau, NP Contact information: [address] [id number] [place] [phone]         Discharge  Instructions    Diet - low sodium heart healthy   Complete by:  As directed    Increase activity slowly   Complete by:  As directed       Discharge Medications   Allergies as of 07/04/2018      Reactions   Shellfish Allergy Anaphylaxis   Throat, face, and Eyes swell , severe headache      Medication List    STOP taking these medications   amLODipine 2.5 MG tablet Commonly known as:  NORVASC   aspirin 81 MG chewable tablet   diltiazem 30 MG tablet Commonly known as:  CARDIZEM   valsartan 80 MG tablet Commonly known as:  DIOVAN     TAKE these  medications   apixaban 5 MG Tabs tablet Commonly known as:  ELIQUIS Take 1 tablet (5 mg total) by mouth 2 (two) times daily.   metoprolol tartrate 25 MG tablet Commonly known as:  LOPRESSOR Take 1 tablet (25 mg total) by mouth 2 (two) times daily.        Acute coronary syndrome (MI, NSTEMI, STEMI, etc) this admission?: No.    Outstanding Labs/Studies    Follow up BMET and CBC will need to arranged at follow up in the AFib Clinic for 4-6 weeks after discharge (new start on Eliquis)   Duration of Discharge Encounter   Greater than 30 minutes including physician time.  Signed, Richardson Dopp, PA-C 07/04/2018, 12:18 PM

## 2018-07-04 NOTE — Progress Notes (Signed)
Paper script attached to AVS from S.Weaver,PA due to patient's pharmacy being closed on sundays.

## 2018-07-04 NOTE — Progress Notes (Signed)
patient discharged from [facility] without difficulty. Pt provided evening doses of metoprolol and eliquis per MD suggestion; pt also provided an eliquis discount card.  Pt escorted to daughters car via wheelchair.

## 2018-07-04 NOTE — H&P (Signed)
Cardiology History & Physical    Patient ID: Bradley Gilbert MRN: [id number], DOB: 1951/07/06 Date of Encounter: 07/04/2018, 2:33 AM Primary Physician: Josetta Huddle, MD Primary Cardiologist: No primary care provider on file.  Chief Complaint: dizziness Reason for Admission: AF with RVR  HPI: Bradley Gilbert is a 67 y.o. male with history of paroxysmal A. fib, nonobstructive coronary disease, and left bundle branch block who presents for atrial fibrillation with RVR.  Patient was feeling fine until earlier this evening when he felt some dizziness.  Checked his heart rate at home and found it to be irregular with rates as fast as 160.  Went to a [facility] and was told he was in A. fib and EMS was called.  In route to the ED he was given amiodarone.    EKG here confirms atrial fibrillation with left bundle branch block.  Rate here 100-120.  Labs unremarkable.  Patient is currently has no complaints.  Denies any recent chest pain, dyspnea, lower extremity edema, weight gain or other new symptoms.  Patient was diagnosed with A. fib in June 2018.  He took apixaban for about a month but this was stopped due to chads VASC of 1.  He is not on chronic rate control because heart rates occasionally are in the 50s at home.  Past Medical History:  Diagnosis Date  . Allergic rhinitis   . Coronary artery disease    Cath 08/15/11 with 30% mid LAD, o/w no CAD  . Hearing loss   . History of colon polyps    BENGIN  . Left rotator cuff tear   . OA (osteoarthritis)    KNEES AND LEFT SHOULDER     Surgical History:  Past Surgical History:  Procedure Laterality Date  . 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 CARPAL TUNNEL RELEASE Right 1990  . COLONOSCOPY    . KNEE ARTHROSCOPY Right YRS AGO  . LAPAROSCOPIC INGUINAL  HERNIA REPAIR Right 09-05-2011  . POLYPECTOMY    . SHOULDER ARTHROSCOPY WITH SUBACROMIAL DECOMPRESSION AND BICEP TENDON REPAIR Left 05/12/2014   Procedure: LEFT ARTHROSCOPY SHOULDER WITH EXAM UNDER ANESTHESIA,SUBACROMIAL DECOMPRESSION DISTAL CLAVICAL RESECTION LABIAL REPAIR VERSES DEBRIDEMENT;  Surgeon: Sydnee Cabal, MD;  Location: [facility];  Service: Orthopedics;  Laterality: Left;  . TRANSTHORACIC ECHOCARDIOGRAM  08-19-2011   MILD LVH/  EF 62-83%/  GRADE I DIASTOLIC DYSFUNCTION     Home Meds: Prior to Admission medications   Medication Sig Start Date End Date Taking? Authorizing Provider  amLODipine (NORVASC) 2.5 MG tablet Take 2.5 mg by mouth daily.   Yes [provider]  aspirin 81 MG chewable tablet Chew 81 mg by mouth daily.   Yes [provider]  diltiazem (CARDIZEM) 30 MG tablet Take 1 tablet every 4 hours AS NEEDED for afib heart rate >100 05/04/17  Yes Sherran Needs, NP  valsartan (DIOVAN) 80 MG tablet Take 40 mg by mouth daily.   Yes [provider]  apixaban (ELIQUIS) 5 MG TABS tablet Take 1 tablet (5 mg total) by mouth 2 (two) times daily. Patient not taking: Reported on 11/25/2017 04/29/17   Geradine Girt, DO    Allergies:  Allergies  Allergen Reactions  . Shellfish Allergy Anaphylaxis    Throat, face, and Eyes swell ,  severe headache    Social History   Socioeconomic History  . Marital status: Married    Spouse name: Not on file  . Number of children: Not on file  . Years of education: Not on file  . Highest education level: Not on file  Occupational History  . Occupation: Diet Therapist, music: [employer]  Social Needs  . Financial resource strain: Not on file  . Food insecurity:    Worry: Not on file    Inability: Not on file  . Transportation needs:    Medical: Not on file    Non-medical: Not on file  Tobacco Use  . Smoking status: Never Smoker  . Smokeless tobacco: Never Used  Substance and  Sexual Activity  . Alcohol use: No    Alcohol/week: 0.0 standard drinks  . Drug use: No  . Sexual activity: Not on file  Lifestyle  . Physical activity:    Days per week: Not on file    Minutes per session: Not on file  . Stress: Not on file  Relationships  . Social connections:    Talks on phone: Not on file    Gets together: Not on file    Attends religious service: Not on file    Active member of club or organization: Not on file    Attends meetings of clubs or organizations: Not on file    Relationship status: Not on file  . Intimate partner violence:    Fear of current or ex partner: Not on file    Emotionally abused: Not on file    Physically abused: Not on file    Forced sexual activity: Not on file  Other Topics Concern  . Not on file  Social History Narrative   Graduate of [employer].  Married - '85.  1 son - '70,  2 daughters - '74, '87;   5 grandchildren.  Hobbies - keeps horses and ponies.  Marriage - good health              Family History  Problem Relation Age of Onset  . Diabetes Mother   . Heart disease Father   . Cancer Father        throat  . COPD Father        throat  . Colon cancer Neg Hx   . Prostate cancer Neg Hx   . Rectal cancer Neg Hx   . Stomach cancer Neg Hx     Review of Systems: General: negative for chills, fever, night sweats or weight changes.  Positive for dizziness Cardiovascular: negative for chest pain, edema, orthopnea, palpitations, paroxysmal nocturnal dyspnea, shortness of breath or dyspnea on exertion Dermatological: negative for rash Respiratory: negative for cough or wheezing Urologic: negative for hematuria Abdominal: negative for nausea, vomiting, diarrhea, bright red blood per rectum, melena, or hematemesis Neurologic: negative for visual changes, syncope All other systems reviewed and are otherwise negative except as noted above.  Labs:   Lab Results  Component Value Date   WBC 8.6 07/04/2018   HGB 15.3  07/04/2018   HCT 45.4 07/04/2018   MCV 94.2 07/04/2018   PLT 195 07/04/2018    Recent Labs  Lab 07/04/18 0000  NA 139  K 4.0  CL 107  CO2 22  BUN 21  CREATININE 1.15  CALCIUM 8.7*  GLUCOSE 196*   No results for input(s): CKTOTAL, CKMB, TROPONINI in the last 72 hours. Lab Results  Component Value Date   CHOL  151 04/29/2017   HDL 44 04/29/2017   LDLCALC 84 04/29/2017   TRIG 117 04/29/2017   Lab Results  Component Value Date   DDIMER <0.22 08/15/2011    Radiology/Studies:  Dg Chest Port 1 View  Result Date: 07/04/2018 CLINICAL DATA:  Acute onset of dizziness. Tachycardia. Nausea. EXAM: PORTABLE CHEST 1 VIEW COMPARISON:  Chest radiograph performed 04/28/2017 FINDINGS: The lungs are well-aerated. Mild scarring is noted at the lung bases. The left costophrenic angle is incompletely imaged on this study. There is no evidence of significant focal opacification, pleural effusion or pneumothorax. The cardiomediastinal silhouette is within normal limits. No acute osseous abnormalities are seen. IMPRESSION: Mild scarring at the lung bases. Lungs otherwise clear. Electronically Signed   By: Garald Balding M.D.   On: 07/04/2018 00:15   Wt Readings from Last 3 Encounters:  11/25/17 92.4 kg  07/03/17 88.5 kg  05/04/17 87.4 kg    EKG: Atrial fibrillation, left bundle branch block  Physical Exam:Blood pressure 124/74, pulse (!) 56, resp. rate 15, SpO2 97 %. There is no height or weight on file to calculate BMI. General: Well developed, well nourished, in no acute distress. Head: Normocephalic, atraumatic, sclera non-icteric, no xanthomas, nares are without discharge.  Neck: Negative for carotid bruits. JVD not elevated. Lungs: Clear bilaterally to auscultation without wheezes, rales, or rhonchi. Breathing is unlabored. Heart: Irregularly irregular with S1 S2. No murmurs, rubs, or gallops appreciated. Abdomen: Soft, non-tender, non-distended with normoactive bowel sounds. No  hepatomegaly. No rebound/guarding. No obvious abdominal masses. Msk:  Strength and tone appear normal for age. Extremities: No clubbing or cyanosis. No edema.  Distal pedal pulses are 2+ and equal bilaterally. Neuro: Alert and oriented X 3. No focal deficit. No facial asymmetry. Moves all extremities spontaneously. Psych:  Responds to questions appropriately with a normal affect.    Assessment and Plan   1.  Atrial fibrillation with RVR Presents with recurrent atrial fibrillation.  Heart rates currently better controlled but still above goal.  Plan for observation admission for additional rate control and possibly cardioversion.  We will start metoprolol tonight and reassess heart rate and symptoms tomorrow.  If he remains symptomatic can consider DC cardioversion Monday with or without TEE.  Will start apixaban tonight given possible need for cardioversion.  Can consider referral to EP in the future for consideration of catheter ablation or antiarrhythmic drug. -Start metoprolol 25 mg p.o. twice daily -Start apixaban 5 mg twice daily   Severity of Illness: The appropriate patient status for this patient is OBSERVATION. Observation status is judged to be reasonable and necessary in order to provide the required intensity of service to ensure the patient's safety. The patient's presenting symptoms, physical exam findings, and initial radiographic and laboratory data in the context of their medical condition is felt to place them at decreased risk for further clinical deterioration. Furthermore, it is anticipated that the patient will be medically stable for discharge from the hospital within 2 midnights of admission. The following factors support the patient status of observation.   " The patient's presenting symptoms include dizziness. " The physical exam findings include irregular pulse. " The initial radiographic and laboratory data are consistent with AF.     For questions or updates,  please contact [place] Please consult [email or web address] for contact info under Cardiology/STEMI.  Liliane Bade, MD 07/04/2018, 2:33 AM

## 2018-07-04 NOTE — Discharge Instructions (Signed)
We put you on Metoprolol Tartrate for your atrial fibrillation this admission.  It is also a blood pressure medicine.  Your blood pressure is normal on this for now.  Therefore, we are not resuming your Amlodipine and Valsartan at discharge.  Follow up with your PCP to determine if you need to resume either of these drugs.  Apixaban oral tablets What is this medicine? APIXABAN (a PIX a ban) is an anticoagulant (blood thinner). It is used to lower the chance of stroke in people with a medical condition called atrial fibrillation. It is also used to treat or prevent blood clots in the lungs or in the veins. This medicine may be used for other purposes; ask your health care provider or pharmacist if you have questions. COMMON BRAND NAME(S): Eliquis What should I tell my health care provider before I take this medicine? They need to know if you have any of these conditions: -bleeding disorders -bleeding in the brain -blood in your stools (black or tarry stools) or if you have blood in your vomit -history of stomach bleeding -kidney disease -liver disease -mechanical heart valve -an unusual or allergic reaction to apixaban, other medicines, foods, dyes, or preservatives -pregnant or trying to get pregnant -breast-feeding How should I use this medicine? Take this medicine by mouth with a glass of water. Follow the directions on the prescription label. You can take it with or without food. If it upsets your stomach, take it with food. Take your medicine at regular intervals. Do not take it more often than directed. Do not stop taking except on your doctor's advice. Stopping this medicine may increase your risk of a blot clot. Be sure to refill your prescription before you run out of medicine. Talk to your pediatrician regarding the use of this medicine in children. Special care may be needed. Overdosage: If you think you have taken too much of this medicine contact a poison control center or emergency  room at once. NOTE: This medicine is only for you. Do not share this medicine with others. What if I miss a dose? If you miss a dose, take it as soon as you can. If it is almost time for your next dose, take only that dose. Do not take double or extra doses. What may interact with this medicine? This medicine may interact with the following: -aspirin and aspirin-like medicines -certain medicines for fungal infections like ketoconazole and itraconazole -certain medicines for seizures like carbamazepine and phenytoin -certain medicines that treat or prevent blood clots like warfarin, enoxaparin, and dalteparin -clarithromycin -NSAIDs, medicines for pain and inflammation, like ibuprofen or naproxen -rifampin -ritonavir -St. John's wort This list may not describe all possible interactions. Give your health care provider a list of all the medicines, herbs, non-prescription drugs, or dietary supplements you use. Also tell them if you smoke, drink alcohol, or use illegal drugs. Some items may interact with your medicine. What should I watch for while using this medicine? Visit your doctor or health care professional for regular checks on your progress. Notify your doctor or health care professional and seek emergency treatment if you develop breathing problems; changes in vision; chest pain; severe, sudden headache; pain, swelling, warmth in the leg; trouble speaking; sudden numbness or weakness of the face, arm or leg. These can be signs that your condition has gotten worse. If you are going to have surgery or other procedure, tell your doctor that you are taking this medicine. What side effects may I notice from receiving  this medicine? Side effects that you should report to your doctor or health care professional as soon as possible: -allergic reactions like skin rash, itching or hives, swelling of the face, lips, or tongue -signs and symptoms of bleeding such as bloody or black, tarry stools; red  or dark-brown urine; spitting up blood or brown material that looks like coffee grounds; red spots on the skin; unusual bruising or bleeding from the eye, gums, or nose This list may not describe all possible side effects. Call your doctor for medical advice about side effects. You may report side effects to FDA at 1-800-FDA-1088. Where should I keep my medicine? Keep out of the reach of children. Store at room temperature between 20 and 25 degrees C (68 and 77 degrees F). Throw away any unused medicine after the expiration date. NOTE: This sheet is a summary. It may not cover all possible information. If you have questions about this medicine, talk to your doctor, pharmacist, or health care provider.  2018 Elsevier/Gold Standard (2016-06-02 11:54:23)

## 2018-07-04 NOTE — Progress Notes (Signed)
Call placed to [facility] to notify of telemetry monitoring d/c.   

## 2018-07-04 NOTE — Progress Notes (Addendum)
Progress Note  Patient Name: Bradley Gilbert Date of Encounter: 07/04/2018  Primary Cardiologist:  Followed in AFib Clinic   Subjective   No chest pain, shortness of breath   Inpatient Medications    Scheduled Meds: . apixaban  5 mg Oral BID  . metoprolol tartrate  25 mg Oral BID   Continuous Infusions:  PRN Meds: acetaminophen, ondansetron (ZOFRAN) IV   Vital Signs    Vitals:   07/04/18 0200 07/04/18 0300 07/04/18 0329 07/04/18 0628  BP: 124/74 111/71 131/87 127/87  Pulse: (!) 56 89 81 100  Resp: 15 13 18    Temp:   98.2 F (36.8 C)   TempSrc:   Oral   SpO2: 97% 97% 99% 96%  Weight:   91 kg   Height:   5\' 10"  (1.778 m)     Intake/Output Summary (Last 24 hours) at 07/04/2018 0743 Last data filed at 07/04/2018 0600 Gross per 24 hour  Intake 740.2 ml  Output -  Net 740.2 ml   Filed Weights   07/04/18 0329  Weight: 91 kg    Telemetry    AFib, HRs 70-160s - Personally Reviewed  ECG    AFib, HR 128, LBBB - Personally Reviewed  Physical Exam   GEN: No acute distress.   Lymph: no cervical adenopathy Cardiac: irreg irreg rhythm, no murmurs Respiratory: Clear to auscultation bilaterally. GI: Soft, nontender, non-distended  MS: No edema; No deformity. Neuro:  Nonfocal  Psych: Normal affect   Labs    Chemistry Recent Labs  Lab 07/04/18 0000  NA 139  K 4.0  CL 107  CO2 22  GLUCOSE 196*  BUN 21  CREATININE 1.15  CALCIUM 8.7*  GFRNONAA >60  GFRAA >60  ANIONGAP 10     Hematology Recent Labs  Lab 07/04/18 0000  WBC 8.6  RBC 4.82  HGB 15.3  HCT 45.4  MCV 94.2  MCH 31.7  MCHC 33.7  RDW 12.7  PLT 195    Cardiac EnzymesNo results for input(s): TROPONINI in the last 168 hours.  Recent Labs  Lab 07/04/18 0004  TROPIPOC 0.00     BNP Recent Labs  Lab 07/04/18 0000  BNP 30.1       Radiology    Dg Chest Port 1 View  Result Date: 07/04/2018 CLINICAL DATA:  Acute onset of dizziness. Tachycardia. Nausea. EXAM: PORTABLE  CHEST 1 VIEW COMPARISON:  Chest radiograph performed 04/28/2017 FINDINGS: The lungs are well-aerated. Mild scarring is noted at the lung bases. The left costophrenic angle is incompletely imaged on this study. There is no evidence of significant focal opacification, pleural effusion or pneumothorax. The cardiomediastinal silhouette is within normal limits. No acute osseous abnormalities are seen. IMPRESSION: Mild scarring at the lung bases. Lungs otherwise clear. Electronically Signed   By: Garald Balding M.D.   On: 07/04/2018 00:15    Cardiac Studies   None   Patient Profile     67 y.o. male with a hx of paroxysmal atrial fibrillation, hypertension, minimal coronary plaque on Cardiac Catheterization in 2012 admitted last night with AFib with RVR.  Rate was difficult to control and EMS gave Amiodarone.  Here, his rate is better on low dose metoprolol tartrate.  Assessment & Plan    1. Atrial Fibrillation with rapid ventricular rate  He remains in AFib.  His HR is better at rest.  With using the bathroom, his HR did go up to 160s.  BP could possibly tolerate higher dose beta-blocker.  Of note,  his PCP recently placed him on blood pressure medication.  CHADS2-VASc=2 (HTN, age x 1).  Therefore, he should probably be on anticoagulation long term.  - Increase Metoprolol Tartrate to 37.5 mg Twice daily   - Continue Apixaban  - If HR with fair control, possible DC today/tomorrow AM   - Will ambulate to see what his HR does  - He can follow up in AF Clinic to arrange DCCV after 3-4 weeks of uninterrupted anticoagulation.   2. Hypertension Blood pressure controlled.      For questions or updates, please contact [place] Please consult [email or web address] for contact info under Cardiology/STEMI.      Signed, Richardson Dopp, PA-C  07/04/2018, 7:43 AM    Attending note Patient seen and discussed with PA [name], I agree with his documentation. 67 yo male history of afib, mild CAD by cath 2012,  vertigo. From afib clinic notes he has been on prn cardizem, baseline heart rates 50s at times and thus have not committed to daily av nodal agents. Had not been on anticoag recently. Admitted with dizziness, found to be in afib with RVR. Started on oral lopressor 25mg  bid as well as eliquis 5mg  bid. He recently diagnosed with HTN, CHADS2Vasc score is 2 and longterm anticoag is indicated. If rates controlled would be ok for discharge today, can f/u after 3 weeks of anticoag and if still in afib could consider DCCV. Of note underlying LBBB that is chronic.   Appears first dose of lopressor 25mg  given at 630AM, rates improving since then. Most of his tachycardia appeared prior to this dose. Will continue at 25mg  bid. Tele actually looks like he is back in SR, will repeat ekg.    Carlyle Dolly MD

## 2018-07-08 ENCOUNTER — Ambulatory Visit (HOSPITAL_COMMUNITY)
Admission: RE | Admit: 2018-07-08 | Discharge: 2018-07-08 | Disposition: A | Discharge status: Home / Self Care | Payer: Medicare Other | Source: Ambulatory Visit | Attending: Nurse Practitioner | Admitting: Nurse Practitioner

## 2018-07-08 ENCOUNTER — Encounter (HOSPITAL_COMMUNITY): Payer: Self-pay | Admitting: Nurse Practitioner

## 2018-07-08 VITALS — BP 138/84 | HR 67 | Ht 70.0 in | Wt 200.0 lb

## 2018-07-08 DIAGNOSIS — I2510 Atherosclerotic heart disease of native coronary artery without angina pectoris: Secondary | ICD-10-CM

## 2018-07-08 DIAGNOSIS — Z86010 Personal history of colonic polyps: Secondary | ICD-10-CM | POA: Diagnosis not present

## 2018-07-08 DIAGNOSIS — Z79899 Other long term (current) drug therapy: Secondary | ICD-10-CM | POA: Diagnosis not present

## 2018-07-08 DIAGNOSIS — M170 Bilateral primary osteoarthritis of knee: Secondary | ICD-10-CM | POA: Diagnosis not present

## 2018-07-08 DIAGNOSIS — I447 Left bundle-branch block, unspecified: Secondary | ICD-10-CM | POA: Insufficient documentation

## 2018-07-08 DIAGNOSIS — Z7901 Long term (current) use of anticoagulants: Secondary | ICD-10-CM | POA: Diagnosis not present

## 2018-07-08 DIAGNOSIS — I480 Paroxysmal atrial fibrillation: Secondary | ICD-10-CM

## 2018-07-08 DIAGNOSIS — M19012 Primary osteoarthritis, left shoulder: Secondary | ICD-10-CM | POA: Diagnosis not present

## 2018-07-08 DIAGNOSIS — I4891 Unspecified atrial fibrillation: Secondary | ICD-10-CM

## 2018-07-08 DIAGNOSIS — I10 Essential (primary) hypertension: Secondary | ICD-10-CM | POA: Diagnosis not present

## 2018-07-08 NOTE — Progress Notes (Signed)
Primary Care Physician: Josetta Huddle, MD Referring Physician: [facility], The hospital f/u   Bradley Gilbert is a 67 y.o. male with a h/o mild CAD by cath in 2012,  that presented to the ER intially with dizziness and subsequent diaphoresis and repetitive vomiting 04/29/17.  He presented to his PCP's office and was sent to the ER. He was found to be in afib with rvr.He was found to have a mild flat troponin leak thought to be from rvr. He was found to have significant vertigo with ataxia. He states that his symptoms improved with IV hydration. He does work outside a lot and had been in the heat a lot the previous day.  I saw him then as a ER f/u and he was in [place]. He had a sleep study in August that did not show any apnea.  He had done well without any further afib until 8/11. He had been out in the heat a lot that day and became aware of dizziness late that pm. He took  an extra  30 mg Cardizem but within a few minutes decided to go to the Fire department, which was at the end of his street.  He had a HR in the 160-180 bpm range and decided to go to the ER as his BP was soft.  He was admitted and started on po metoprolol and converted with the first dose. Apixaban 5 mg bid for CHA2DS2VASc score of 2( HTN, age).  He is here to f/u on that visit. He is in SR. He does have LBBB, CAD by cath in 2012, was found not to have a left main but with separate ostia for the LAD and LCfx.30% LAD lesion found. He denies any recent exertional chest pain or shortness of breath    Today, he denies symptoms of palpitations, chest pain, shortness of breath, orthopnea, PND, lower extremity edema, dizziness, presyncope, syncope, or neurologic sequela. The patient is tolerating medications without difficulties and is otherwise without complaint today.   Past Medical History:  Diagnosis Date  . Allergic rhinitis   . Coronary artery disease    Cath 08/15/11 with 30% mid LAD, o/w no CAD  . Hearing loss   . History of colon polyps      BENGIN  . Left rotator cuff tear   . OA (osteoarthritis)    KNEES AND LEFT SHOULDER   Past Surgical History:  Procedure Laterality Date  . 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 CARPAL TUNNEL RELEASE Right 1990  . COLONOSCOPY    . KNEE ARTHROSCOPY Right YRS AGO  . LAPAROSCOPIC INGUINAL HERNIA REPAIR Right 09-05-2011  . POLYPECTOMY    . SHOULDER ARTHROSCOPY WITH SUBACROMIAL DECOMPRESSION AND BICEP TENDON REPAIR Left 05/12/2014   Procedure: LEFT ARTHROSCOPY SHOULDER WITH EXAM UNDER ANESTHESIA,SUBACROMIAL DECOMPRESSION DISTAL CLAVICAL RESECTION LABIAL REPAIR VERSES DEBRIDEMENT;  Surgeon: Sydnee Cabal, MD;  Location: [facility];  Service: Orthopedics;  Laterality: Left;  . TRANSTHORACIC ECHOCARDIOGRAM  08-19-2011   MILD LVH/  EF 52-77%/  GRADE I DIASTOLIC DYSFUNCTION    Current Outpatient Medications  Medication Sig Dispense Refill  . apixaban (ELIQUIS) 5 MG TABS tablet Take 1 tablet (5 mg total) by mouth 2 (two) times daily. 60 tablet 11  . metoprolol tartrate (LOPRESSOR) 25 MG tablet Take 1 tablet (  25 mg total) by mouth 2 (two) times daily. 60 tablet 11   No current facility-administered medications for this encounter.     Allergies  Allergen Reactions  . Shellfish Allergy Anaphylaxis    Throat, face, and Eyes swell , severe headache    Social History   Socioeconomic History  . Marital status: Married    Spouse name: Not on file  . Number of children: Not on file  . Years of education: Not on file  . Highest education level: Not on file  Occupational History  . Occupation: Diet Therapist, music: [employer]  Social Needs  . Financial resource strain: Not on file  . Food insecurity:    Worry: Not on file    Inability: Not on file  . Transportation needs:    Medical: Not  on file    Non-medical: Not on file  Tobacco Use  . Smoking status: Never Smoker  . Smokeless tobacco: Never Used  Substance and Sexual Activity  . Alcohol use: No    Alcohol/week: 0.0 standard drinks  . Drug use: No  . Sexual activity: Not on file  Lifestyle  . Physical activity:    Days per week: Not on file    Minutes per session: Not on file  . Stress: Not on file  Relationships  . Social connections:    Talks on phone: Not on file    Gets together: Not on file    Attends religious service: Not on file    Active member of club or organization: Not on file    Attends meetings of clubs or organizations: Not on file    Relationship status: Not on file  . Intimate partner violence:    Fear of current or ex partner: Not on file    Emotionally abused: Not on file    Physically abused: Not on file    Forced sexual activity: Not on file  Other Topics Concern  . Not on file  Social History Narrative   Graduate of [employer].  Married - '85.  1 son - '70,  2 daughters - '74, '87;   5 grandchildren.  Hobbies - keeps horses and ponies.  Marriage - good health             Family History  Problem Relation Age of Onset  . Diabetes Mother   . Heart disease Father   . Cancer Father        throat  . COPD Father        throat  . Colon cancer Neg Hx   . Prostate cancer Neg Hx   . Rectal cancer Neg Hx   . Stomach cancer Neg Hx     ROS- All systems are reviewed and negative except as per the HPI above  Physical Exam: Vitals:   07/08/18 1334  BP: 138/84  Pulse: 67  Weight: 90.7 kg  Height: 5\' 10"  (1.778 m)   Wt Readings from Last 3 Encounters:  07/08/18 90.7 kg  07/04/18 91 kg  11/25/17 92.4 kg    Labs: Lab Results  Component Value Date   NA 139 07/04/2018   K 4.0 07/04/2018   CL 107 07/04/2018   CO2 22 07/04/2018   GLUCOSE 196 (H) 07/04/2018   BUN 21 07/04/2018   CREATININE 1.15 07/04/2018   CALCIUM 8.7 (L) 07/04/2018   MG 2.0 04/28/2017   Lab  Results  Component Value Date   INR 1.18 08/15/2011   Lab  Results  Component Value Date   CHOL 151 04/29/2017   HDL 44 04/29/2017   LDLCALC 84 04/29/2017   TRIG 117 04/29/2017     GEN- The patient is well appearing, alert and oriented x 3 today.   Head- normocephalic, atraumatic Eyes-  Sclera clear, conjunctiva pink Ears- hearing intact Oropharynx- clear Neck- supple, no JVP Lymph- no cervical lymphadenopathy Lungs- Clear to ausculation bilaterally, normal work of breathing Heart- Regular rate and rhythm, no murmurs, rubs or gallops, PMI not laterally displaced GI- soft, NT, ND, + BS Extremities- no clubbing, cyanosis, or edema MS- no significant deformity or atrophy Skin- no rash or lesion Psych- euthymic mood, full affect Neuro- strength and sensation are intact  EKG-NSR with LBBB, pr int 168 ms, qrs int 136 ms, qtc 445 ms Epic records reviewed Echo-Impressions:  - Normal LV size with EF 55-60%. Moderate diastolic dysfunction.   Normal RV size and systolic function. No significant valvular   abnormalities.   Assessment and Plan: 1. New onset paroxysmal afib in June 2018, with return episode 06/2018 Converted with po metoprolol, if afib burden increases may need to move into antiarrythmic's General review of afib, when he can treat at home and when it might be appropriate to go to ER Now on daily metoprolol 25 mg bid  He has  cardizem 30 mg as needed Continue eliquis for a chadsvasc score of 2 Bleeding precautions discussed  2. Mild CAD by cath 2012, LBBB Will refer to general cardiology to get established   3. HTN Stable   afib clinic as needed   Geroge Baseman. Zara Wendt, [facility] [address] [place], [place] [address] [phone]

## 2018-07-14 ENCOUNTER — Other Ambulatory Visit (HOSPITAL_COMMUNITY): Payer: Self-pay

## 2018-07-14 MED ORDER — APIXABAN 5 MG PO TABS: 5.0000 mg | ORAL_TABLET | Freq: Two times a day (BID) | ORAL | 3 refills | Status: DC

## 2018-07-14 MED ORDER — METOPROLOL TARTRATE 25 MG PO TABS: 25.0000 mg | ORAL_TABLET | Freq: Two times a day (BID) | ORAL | 3 refills | Status: DC

## 2018-07-20 ENCOUNTER — Encounter: Payer: Self-pay | Admitting: *Deleted

## 2018-07-20 ENCOUNTER — Ambulatory Visit: Payer: Medicare Other | Admitting: Cardiology

## 2018-07-20 ENCOUNTER — Encounter: Payer: Self-pay | Admitting: Cardiology

## 2018-07-20 VITALS — BP 114/70 | HR 56 | Ht 70.0 in | Wt 198.0 lb

## 2018-07-20 DIAGNOSIS — E785 Hyperlipidemia, unspecified: Secondary | ICD-10-CM | POA: Diagnosis not present

## 2018-07-20 DIAGNOSIS — I480 Paroxysmal atrial fibrillation: Secondary | ICD-10-CM | POA: Diagnosis not present

## 2018-07-20 DIAGNOSIS — Z79899 Other long term (current) drug therapy: Secondary | ICD-10-CM | POA: Diagnosis not present

## 2018-07-20 DIAGNOSIS — I2510 Atherosclerotic heart disease of native coronary artery without angina pectoris: Secondary | ICD-10-CM

## 2018-07-20 MED ORDER — ATORVASTATIN CALCIUM 20 MG PO TABS: 20.0000 mg | ORAL_TABLET | Freq: Every day | ORAL | 3 refills | Status: DC

## 2018-07-20 NOTE — Patient Instructions (Signed)
Medication Instructions:  Please start Atorvastatin 20 mg a day. Continue all other medications as listed.  Labwork: Please have blood work in 2 months (Lipid/ALT)  Testing/Procedures: Your physician has requested that you have a myoview. For further information please visit [email or web address]. Please follow instruction sheet, as given.  Follow-Up: Follow up in 6 months with Dr. Marlou Porch.  You will receive a letter in the mail 2 months before you are due.  Please call us when you receive this letter to schedule your follow up appointment.  If you need a refill on your cardiac medications before your next appointment, please call your pharmacy.  Thank you for choosing [facility]!!

## 2018-07-20 NOTE — Progress Notes (Signed)
Cardiology Office Note:    Date:  07/20/2018   ID:  Bradley Gilbert, DOB 07-15-1951, MRN [id number]  PCP:  Josetta Huddle, MD  Cardiologist:  No primary care provider on file.   Referring MD: Josetta Huddle, MD     History of Present Illness:    Bradley Gilbert "Bradley Gilbert" is a 67 y.o. male here for evaluation of CAD/atrial fibrillation at the request of Dr. Inda Merlin.  Last seen in our atrial fibrillation clinic on 07/08/2018 after 2 separate admissions in June 2018 and in August 2019 with atrial fibrillation.  Had mild CAD by cardiac catheterization in 2012.  In June 2018 when he was found to be in atrial relation he was diaphoretic, vomiting.  Mild flat troponin elevation demand ischemia.  Vertigo, ataxia.  IV hydration helped out quite a bit.  When he was seen in emergency room follow-up in the atrial fibrillation clinic he was in sinus rhythm.  Sleep study was negative.  He went for approximately 14 months without atrial fibrillation and then after a hot day, felt dizzy and was found to have heart rates 1 60-1 80.  Blood pressure was soft.  He converted after first dose of metoprolol.  Eliquis was started because of chads vas score of 2 for hypertension and age.  He is back in sinus rhythm.  Has a left bundle branch block underlying.  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  Otherwise doing quite well.  No fevers chills nausea vomiting syncope bleeding orthopnea PND.  Friend of [name], my friend. [employer].   Past Medical History:  Diagnosis Date  . Allergic rhinitis   . Coronary artery disease    Cath 08/15/11 with 30% mid LAD, o/w no CAD  . Hearing loss   . History of colon polyps    BENGIN  . Left rotator cuff tear   . OA (osteoarthritis)    KNEES AND LEFT SHOULDER    Past Surgical History:  Procedure Laterality Date  . CARDIAC CATHETERIZATION  08-15-2011  DR BENSIMHON   FALSE POSTIVE STRESS TEST--  MINIMAL NON-BSTRUCTIVE CAD  ,  mLAD 30%, LM congenitally absent (there was seprate ostia for the lad & the lcfx)/   NORMAL LVF  . CARPAL TUNNEL RELEASE Right 1990  . COLONOSCOPY    . KNEE ARTHROSCOPY Right YRS AGO  . LAPAROSCOPIC INGUINAL HERNIA REPAIR Right 09-05-2011  . POLYPECTOMY    . SHOULDER ARTHROSCOPY WITH SUBACROMIAL DECOMPRESSION AND BICEP TENDON REPAIR Left 05/12/2014   Procedure: LEFT ARTHROSCOPY SHOULDER WITH EXAM UNDER ANESTHESIA,SUBACROMIAL DECOMPRESSION DISTAL CLAVICAL RESECTION LABIAL REPAIR VERSES DEBRIDEMENT;  Surgeon: Sydnee Cabal, MD;  Location: [facility];  Service: Orthopedics;  Laterality: Left;  . TRANSTHORACIC ECHOCARDIOGRAM  08-19-2011   MILD LVH/  EF 97-02%/  GRADE I DIASTOLIC DYSFUNCTION    Current Medications: Current Meds  Medication Sig  . apixaban (ELIQUIS) 5 MG TABS tablet Take 1 tablet (5 mg total) by mouth 2 (two) times daily.  . metoprolol tartrate (LOPRESSOR) 25 MG tablet Take 1 tablet (25 mg total) by mouth 2 (two) times daily.     Allergies:   Shellfish allergy   Social History   Socioeconomic History  . Marital status: Married    Spouse name: Not on file  . Number of children: Not on file  . Years of education: Not on file  . Highest education level: Not on file  Occupational History  . Occupation: Diet Banker  Employer: [employer]  Social Needs  . Financial resource strain: Not on file  . Food insecurity:    Worry: Not on file    Inability: Not on file  . Transportation needs:    Medical: Not on file    Non-medical: Not on file  Tobacco Use  . Smoking status: Never Smoker  . Smokeless tobacco: Never Used  Substance and Sexual Activity  . Alcohol use: No    Alcohol/week: 0.0 standard drinks  . Drug use: No  . Sexual activity: Not on file  Lifestyle  . Physical activity:    Days per week: Not on file    Minutes per session: Not on file  . Stress: Not on file  Relationships  . Social connections:    Talks on phone: Not on  file    Gets together: Not on file    Attends religious service: Not on file    Active member of club or organization: Not on file    Attends meetings of clubs or organizations: Not on file    Relationship status: Not on file  Other Topics Concern  . Not on file  Social History Narrative   Graduate of [employer].  Married - '85.  1 son - '70,  2 daughters - '74, '87;   5 grandchildren.  Hobbies - keeps horses and ponies.  Marriage - good health              Family History: The patient's family history includes COPD in his father; Cancer in his father; Diabetes in his mother; Heart disease in his father. There is no history of Colon cancer, Prostate cancer, Rectal cancer, or Stomach cancer.  ROS:   Please see the history of present illness.     All other systems reviewed and are negative.  EKGs/Labs/Other Studies Reviewed:    The following studies were reviewed today:  Echo 04/2017: - Left ventricle: The cavity size was normal. Wall thickness was   normal. Systolic function was normal. The estimated ejection   fraction was in the range of 55% to 60%. Wall motion was normal;   there were no regional wall motion abnormalities. Features are   consistent with a pseudonormal left ventricular filling pattern,   with concomitant abnormal relaxation and increased filling   pressure (grade 2 diastolic dysfunction). - Aortic valve: There was no stenosis. - Mitral valve: There was trivial regurgitation. - Left atrium: The atrium was mildly dilated. - Right ventricle: The cavity size was normal. Systolic function   was normal. - Pulmonary arteries: No complete TR doppler jet so unable to   estimate PA systolic pressure. - Inferior vena cava: The vessel was normal in size. The   respirophasic diameter changes were in the normal range (>= 50%),   consistent with normal central venous pressure.  Impressions:  - Normal LV size with EF 55-60%. Moderate diastolic dysfunction.    Normal RV size and systolic function. No significant valvular   abnormalities.  EKG:  EKG is not ordered today.    Recent Labs: 07/04/2018: B Natriuretic Peptide 30.1; BUN 21; Creatinine, Ser 1.15; Hemoglobin 15.3; Platelets 195; Potassium 4.0; Sodium 139  Recent Lipid Panel    Component Value Date/Time   CHOL 151 04/29/2017 0519   TRIG 117 04/29/2017 0519   HDL 44 04/29/2017 0519   CHOLHDL 3.4 04/29/2017 0519   VLDL 23 04/29/2017 0519   LDLCALC 84 04/29/2017 0519   LDLDIRECT 102.0 02/18/2016 1647    Physical  Exam:    VS:  BP 114/70   Pulse (!) 56   Ht 5\' 10"  (1.778 m)   Wt 198 lb (89.8 kg)   SpO2 98%   BMI 28.41 kg/m     Wt Readings from Last 3 Encounters:  07/20/18 198 lb (89.8 kg)  07/08/18 200 lb (90.7 kg)  07/04/18 200 lb 11.2 oz (91 kg)     GEN:  Well nourished, well developed in no acute distress HEENT: Normal NECK: No JVD; No carotid bruits LYMPHATICS: No lymphadenopathy CARDIAC: RRR, no murmurs, rubs, gallops RESPIRATORY:  Clear to auscultation without rales, wheezing or rhonchi  ABDOMEN: Soft, non-tender, non-distended MUSCULOSKELETAL:  No edema; No deformity  SKIN: Warm and dry NEUROLOGIC:  Alert and oriented x 3 PSYCHIATRIC:  Normal affect   ASSESSMENT:    1. Paroxysmal atrial fibrillation (HCC)   2. Coronary artery disease involving native coronary artery of native heart without angina pectoris   3. Hyperlipidemia, unspecified hyperlipidemia type   4. Long-term use of high-risk medication    PLAN:    In order of problems listed above:  Paroxysmal atrial fibrillation - 04/2017, 06/2018-well-controlled with p.o. metoprolol.  Doing well.  Eliquis.  Chads vas score 2.  Has Cardizem 30 mg as needed.  Would rather him take his metoprolol full tablet if necessary.  Taking daily metoprolol 25 mg twice daily.  Elevated troponin - Mild elevated troponin previously seen in the setting of A. fib RVR and known mild CAD.  We will check a stress test since it  is been several years since an ischemic evaluation.  I am comfortable with him staying on his metoprolol for stress test.  If he is unable to get to target heart rate, he can switch to [employer].  Mild nonobstructive CAD 2012 left bundle branch block - Continue with aggressive secondary prevention.  Given his mild CAD, would consider statin therapy.  Last LDL 84 on 04/29/2017.  I will go ahead and start him on atorvastatin 20 mg once a day.  Check LDL and ALT in 2 months.  If he begins to have any myalgias or difficulties, he will let us know.  His wife tried statins in the past and could not tolerate them.  Left bundle branch block - Stable, no syncope.  64-month follow-up.  Medication Adjustments/Labs and Tests Ordered: Current medicines are reviewed at length with the patient today.  Concerns regarding medicines are outlined above.  Orders Placed This Encounter  Procedures  . ALT  . Lipid panel  . Myocardial Perfusion Imaging   Meds ordered this encounter  Medications  . atorvastatin (LIPITOR) 20 MG tablet    Sig: Take 1 tablet (20 mg total) by mouth daily.    Dispense:  90 tablet    Refill:  3    Patient Instructions  Medication Instructions:  Please start Atorvastatin 20 mg a day. Continue all other medications as listed.  Labwork: Please have blood work in 2 months (Lipid/ALT)  Testing/Procedures: Your physician has requested that you have a myoview. For further information please visit [email or web address]. Please follow instruction sheet, as given.  Follow-Up: Follow up in 6 months with Dr. Marlou Porch.  You will receive a letter in the mail 2 months before you are due.  Please call us when you receive this letter to schedule your follow up appointment.  If you need a refill on your cardiac medications before your next appointment, please call your pharmacy.  Thank you for choosing [facility]!!  Signed, Candee Furbish, MD  07/20/2018 10:13 AM    [place]

## 2018-07-27 ENCOUNTER — Telehealth (HOSPITAL_COMMUNITY): Payer: Self-pay | Admitting: *Deleted

## 2018-07-27 NOTE — Telephone Encounter (Signed)
Patient given detailed instructions per Myocardial Perfusion Study Information Sheet for the test on 07/29/18 at 0730. Patient notified to arrive 15 minutes early and that it is imperative to arrive on time for appointment to keep from having the test rescheduled.  If you need to cancel or reschedule your appointment, please call the office within 24 hours of your appointment. . Patient verbalized understanding.Lanisha Stepanian, [facility]

## 2018-07-28 ENCOUNTER — Telehealth: Payer: Self-pay | Admitting: Cardiology

## 2018-07-28 MED ORDER — METOPROLOL TARTRATE 25 MG PO TABS: 25.0000 mg | ORAL_TABLET | Freq: Two times a day (BID) | ORAL | 3 refills | Status: DC

## 2018-07-28 NOTE — Telephone Encounter (Signed)
New Message    Patient is calling because he is due to have a stress test. His concern is that he was in afib yesterday and his heart rate was over 200 on yesterday. He is concern that he will go back in afib. Please call to discuss.

## 2018-07-28 NOTE — Telephone Encounter (Signed)
Pt had At Fib yesterday and had to take extra Metoprolol.  He is going to run out of tablets early as he has had to take extra a few times the last month.  New RX sent into [facility] for BID and prn dosing #210 with refills X 3.  Also reviewed times when it would be appropriate to take extra Metoprolol VS calling MD on call or reporting to ED for further evaluation.  Advised to take Metoprolol tomorrow prior to his nuclear study as instructed by Dr Marlou Porch.  Pt states understanding and expressed gratitude for the time I took to review information and answer his questions.

## 2018-07-29 ENCOUNTER — Ambulatory Visit (HOSPITAL_COMMUNITY): Payer: Medicare Other | Attending: Cardiology

## 2018-07-29 DIAGNOSIS — I2510 Atherosclerotic heart disease of native coronary artery without angina pectoris: Secondary | ICD-10-CM | POA: Insufficient documentation

## 2018-07-29 DIAGNOSIS — I4891 Unspecified atrial fibrillation: Secondary | ICD-10-CM | POA: Insufficient documentation

## 2018-07-29 DIAGNOSIS — I480 Paroxysmal atrial fibrillation: Secondary | ICD-10-CM | POA: Diagnosis not present

## 2018-07-29 DIAGNOSIS — I10 Essential (primary) hypertension: Secondary | ICD-10-CM | POA: Diagnosis not present

## 2018-07-29 DIAGNOSIS — I447 Left bundle-branch block, unspecified: Secondary | ICD-10-CM | POA: Diagnosis not present

## 2018-07-29 LAB — MYOCARDIAL PERFUSION IMAGING
CHL CUP MPHR: 152 {beats}/min
CHL CUP NUCLEAR SDS: 1
CHL CUP NUCLEAR SRS: 0
CHL CUP NUCLEAR SSS: 1
CHL CUP RESTING HR STRESS: 52 {beats}/min
CSEPEW: 9.9 METS
CSEPPHR: 130 {beats}/min
Exercise duration (min): 8 min
LV dias vol: 109 mL (ref 62–150)
LV sys vol: 46 mL
NUC STRESS TID: 1
Percent HR: 85 %
RPE: 18

## 2018-07-29 MED ORDER — TECHNETIUM TC 99M TETROFOSMIN IV KIT
10.3000 | PACK | Freq: Once | INTRAVENOUS | Status: AC | PRN
  Administered 2018-07-29: 10.3 via INTRAVENOUS
  Filled 2018-07-29: qty 11

## 2018-07-29 MED ORDER — TECHNETIUM TC 99M TETROFOSMIN IV KIT
32.0000 | PACK | Freq: Once | INTRAVENOUS | Status: AC | PRN
  Administered 2018-07-29: 32 via INTRAVENOUS
  Filled 2018-07-29: qty 32

## 2018-07-29 MED ORDER — REGADENOSON 0.4 MG/5ML IV SOLN
0.4000 mg | Freq: Once | INTRAVENOUS | Status: AC
  Administered 2018-07-29: 0.4 mg via INTRAVENOUS

## 2018-07-30 ENCOUNTER — Telehealth: Payer: Self-pay | Admitting: Cardiology

## 2018-07-30 NOTE — Telephone Encounter (Signed)
Pt HR is in the 120s BP 123/72. Per Roderic Palau NP will increase metoprolol to 37.5mg  twice a day and come in next week to discuss [facility] admission. Pt in agreement will check with insurance and call back.

## 2018-07-30 NOTE — Telephone Encounter (Signed)
New Message   STAT if HR is under 50 or over 120 (normal HR is 60-100 beats per minute)  1) What is your heart rate? 150  2) Do you have a log of your heart rate readings (document readings)? States its on his phone  3) Do you have any other symptoms? Pt states his heart is in afib and he is having SOB

## 2018-07-30 NOTE — Telephone Encounter (Signed)
Spoke to patient who is calling in regards to his A fib.  He has recently visited Dr Marlou Porch and the A fib clinic.  Yesterday 9/5 he had a stress test in the am and at 5:15 pm he went into A fib at a rate of 120-150.  He took Metoprolol along with Diltiazem at 12 am and at 3, he was out of Afib at a rate of 70-80.    He is very concerned because he is not getting much sleep, troubled by the rhythm and is wondering if he should see someone.  I called Stacy at the A fib clinic and they will contact the patient with further recommendations.

## 2018-07-31 ENCOUNTER — Other Ambulatory Visit: Payer: Self-pay | Admitting: Physician Assistant

## 2018-08-02 ENCOUNTER — Encounter (HOSPITAL_COMMUNITY): Payer: Self-pay | Admitting: Nurse Practitioner

## 2018-08-02 ENCOUNTER — Ambulatory Visit (HOSPITAL_COMMUNITY)
Admission: RE | Admit: 2018-08-02 | Discharge: 2018-08-02 | Disposition: A | Discharge status: Home / Self Care | Payer: Medicare Other | Source: Ambulatory Visit | Attending: Nurse Practitioner | Admitting: Nurse Practitioner

## 2018-08-02 ENCOUNTER — Other Ambulatory Visit: Payer: Self-pay

## 2018-08-02 VITALS — BP 134/82 | HR 63 | Ht 70.0 in | Wt 200.0 lb

## 2018-08-02 DIAGNOSIS — M170 Bilateral primary osteoarthritis of knee: Secondary | ICD-10-CM | POA: Diagnosis not present

## 2018-08-02 DIAGNOSIS — I480 Paroxysmal atrial fibrillation: Secondary | ICD-10-CM | POA: Diagnosis not present

## 2018-08-02 DIAGNOSIS — I2510 Atherosclerotic heart disease of native coronary artery without angina pectoris: Secondary | ICD-10-CM | POA: Insufficient documentation

## 2018-08-02 DIAGNOSIS — Z7901 Long term (current) use of anticoagulants: Secondary | ICD-10-CM | POA: Diagnosis not present

## 2018-08-02 DIAGNOSIS — Z91013 Allergy to seafood: Secondary | ICD-10-CM | POA: Insufficient documentation

## 2018-08-02 DIAGNOSIS — Z9889 Other specified postprocedural states: Secondary | ICD-10-CM | POA: Diagnosis not present

## 2018-08-02 DIAGNOSIS — M19012 Primary osteoarthritis, left shoulder: Secondary | ICD-10-CM | POA: Insufficient documentation

## 2018-08-02 DIAGNOSIS — Z79899 Other long term (current) drug therapy: Secondary | ICD-10-CM | POA: Diagnosis not present

## 2018-08-02 DIAGNOSIS — I10 Essential (primary) hypertension: Secondary | ICD-10-CM | POA: Diagnosis not present

## 2018-08-02 DIAGNOSIS — I447 Left bundle-branch block, unspecified: Secondary | ICD-10-CM | POA: Insufficient documentation

## 2018-08-03 NOTE — Telephone Encounter (Signed)
Pt is a 67 yr old male who last saw Dr Gillian Shields on 07/20/18, weight at that time was 89.8Kg. Last SCr on 07/04/18 was 1.15. Will refill Eliquis 5mg  BID.

## 2018-08-03 NOTE — Progress Notes (Addendum)
Primary Care Physician: Josetta Huddle, MD Referring Physician: [facility] hospital f/u   Bradley Gilbert is a 67 y.o. male with a h/o mild CAD by cath in 2012,  that presented to the ER intially with dizziness and subsequent diaphoresis and repetitive vomiting 04/29/17.  He presented to his PCP's office and was sent to the ER. He was found to be in afib with rvr.He was found to have a mild flat troponin leak thought to be from rvr. He was found to have significant vertigo with ataxia. He states that his symptoms improved with IV hydration. He does work outside a lot and had been in the heat a lot the previous day.  I saw him then as a ER f/u and he was in [place]. He had a sleep study in August that did not show any apnea.  He had done well without any further afib until 8/11. He had been out in the heat a lot that day and became aware of dizziness late that pm. He took  an extra  30 mg Cardizem but within a few minutes decided to go to the Fire department, which was at the end of his street.  He had a HR in the 160-180 bpm range and decided to go to the ER as his BP was soft.  He was admitted and started on po metoprolol and converted with the first dose. Apixaban 5 mg bid for CHA2DS2VASc score of 2( HTN, age).  He is here to f/u on that visit. He is in [place]. He does have LBBB, CAD by cath in 2012, was found not to have a left main but with separate ostia for the LAD and LCfx.30% LAD lesion found. He denies any recent exertional chest pain or shortness of breath   F/u in afib clinic 9/9. He has had a few more episodes of afib none that took her to the ER. His metoprolol was increased to a full tablet last week and he has not had any further spells since then.    Today, he denies symptoms of palpitations, chest pain, shortness of breath, orthopnea, PND, lower extremity edema, dizziness, presyncope, syncope, or neurologic sequela. The patient is tolerating medications without difficulties and is otherwise without  complaint today.   Past Medical History:  Diagnosis Date  . Allergic rhinitis   . Coronary artery disease    Cath 08/15/11 with 30% mid LAD, o/w no CAD  . Hearing loss   . History of colon polyps    BENGIN  . Left rotator cuff tear   . OA (osteoarthritis)    KNEES AND LEFT SHOULDER   Past Surgical History:  Procedure Laterality Date  . 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 CARPAL TUNNEL RELEASE Right 1990  . COLONOSCOPY    . KNEE ARTHROSCOPY Right YRS AGO  . LAPAROSCOPIC INGUINAL HERNIA REPAIR Right 09-05-2011  . POLYPECTOMY    . SHOULDER ARTHROSCOPY WITH SUBACROMIAL DECOMPRESSION AND BICEP TENDON REPAIR Left 05/12/2014   Procedure: LEFT ARTHROSCOPY SHOULDER WITH EXAM UNDER ANESTHESIA,SUBACROMIAL DECOMPRESSION DISTAL CLAVICAL RESECTION LABIAL REPAIR VERSES DEBRIDEMENT;  Surgeon: Sydnee Cabal, MD;  Location: [place];  Service: Orthopedics;  Laterality: Left;  . TRANSTHORACIC ECHOCARDIOGRAM  08-19-2011   MILD LVH/  EF 16-96%/  GRADE I DIASTOLIC DYSFUNCTION  Current Outpatient Medications  Medication Sig Dispense Refill  . apixaban (ELIQUIS) 5 MG TABS tablet Take 1 tablet (5 mg total) by mouth 2 (two) times daily. 180 tablet 3  . atorvastatin (LIPITOR) 20 MG tablet Take 1 tablet (20 mg total) by mouth daily. 90 tablet 3  . metoprolol tartrate (LOPRESSOR) 25 MG tablet Take 1 tablet (25 mg total) by mouth 2 (two) times daily. Take 1 tablet by mouth twice a day and as needed for At Fib 210 tablet 3   No current facility-administered medications for this encounter.     Allergies  Allergen Reactions  . Shellfish Allergy Anaphylaxis    Throat, face, and Eyes swell , severe headache    Social History   Socioeconomic History  . Marital status: Married    Spouse name: Not on file  . Number  of children: Not on file  . Years of education: Not on file  . Highest education level: Not on file  Occupational History  . Occupation: Diet Therapist, music: [employer]  Social Needs  . Financial resource strain: Not on file  . Food insecurity:    Worry: Not on file    Inability: Not on file  . Transportation needs:    Medical: Not on file    Non-medical: Not on file  Tobacco Use  . Smoking status: Never Smoker  . Smokeless tobacco: Never Used  Substance and Sexual Activity  . Alcohol use: No    Alcohol/week: 0.0 standard drinks  . Drug use: No  . Sexual activity: Not on file  Lifestyle  . Physical activity:    Days per week: Not on file    Minutes per session: Not on file  . Stress: Not on file  Relationships  . Social connections:    Talks on phone: Not on file    Gets together: Not on file    Attends religious service: Not on file    Active member of club or organization: Not on file    Attends meetings of clubs or organizations: Not on file    Relationship status: Not on file  . Intimate partner violence:    Fear of current or ex partner: Not on file    Emotionally abused: Not on file    Physically abused: Not on file    Forced sexual activity: Not on file  Other Topics Concern  . Not on file  Social History Narrative   Graduate of [employer].  Married - '85.  1 son - '70,  2 daughters - '74, '87;   5 grandchildren.  Hobbies - keeps horses and ponies.  Marriage - good health             Family History  Problem Relation Age of Onset  . Diabetes Mother   . Heart disease Father   . Cancer Father        throat  . COPD Father        throat  . Colon cancer Neg Hx   . Prostate cancer Neg Hx   . Rectal cancer Neg Hx   . Stomach cancer Neg Hx     ROS- All systems are reviewed and negative except as per the HPI above  Physical Exam: Vitals:   08/02/18 1352  BP: 134/82  Pulse: 63  Weight: 90.7 kg  Height: 5\' 10"  (1.778 m)   Wt  Readings from Last 3 Encounters:  08/02/18 90.7 kg  07/29/18 89.8 kg  07/20/18  89.8 kg    Labs: Lab Results  Component Value Date   NA 139 07/04/2018   K 4.0 07/04/2018   CL 107 07/04/2018   CO2 22 07/04/2018   GLUCOSE 196 (H) 07/04/2018   BUN 21 07/04/2018   CREATININE 1.15 07/04/2018   CALCIUM 8.7 (L) 07/04/2018   MG 2.0 04/28/2017   Lab Results  Component Value Date   INR 1.18 08/15/2011   Lab Results  Component Value Date   CHOL 151 04/29/2017   HDL 44 04/29/2017   LDLCALC 84 04/29/2017   TRIG 117 04/29/2017     GEN- The patient is well appearing, alert and oriented x 3 today.   Head- normocephalic, atraumatic Eyes-  Sclera clear, conjunctiva pink Ears- hearing intact Oropharynx- clear Neck- supple, no JVP Lymph- no cervical lymphadenopathy Lungs- Clear to ausculation bilaterally, normal work of breathing Heart- Regular rate and rhythm, no murmurs, rubs or gallops, PMI not laterally displaced GI- soft, NT, ND, + BS Extremities- no clubbing, cyanosis, or edema MS- no significant deformity or atrophy Skin- no rash or lesion Psych- euthymic mood, full affect Neuro- strength and sensation are intact  EKG-NSR with LBBB, pr int 168 ms, qrs int 136 ms, qtc 445 ms Epic records reviewed Echo-Impressions:  - Normal LV size with EF 55-60%. Moderate diastolic dysfunction.   Normal RV size and systolic function. No significant valvular   abnormalities.   Assessment and Plan: 1. New onset paroxysmal afib in June 2018, with return episode 06/2018, a few shorter episodes since then General education again reviewed with pt re afib, reassured He is happy right now with increase of metoprolol If  afib burden increases may need to move into antiarrythmic's, not a candidate for flecainide 2/2 LBBB at baseline On young side for amiodarone, may need tikosyn General review of afib, when he can treat at home and when it might be appropriate to go to ER Now on daily  metoprolol 25 mg bid  He has  cardizem 30 mg as needed Continue eliquis for a chadsvasc score of 2 Bleeding precautions discussed  2. Mild CAD by cath 2012, LBBB  Recent stress test with Dr. Marlou Porch, EF normal and no evidence for ischemia  3. HTN Stable   Afib clinic as needed.  He will let me know when he thinks he amy be ready to move into antiarryhmic's  Butch Penny C. Jennel Mara, [facility] [address] [place], [place] [address] [phone]

## 2018-08-04 NOTE — Telephone Encounter (Signed)
From 08/02/18 appt with Roderic Palau, NP: Assessment and Plan: 1. New onset paroxysmal afib in June 2018, with return episode 06/2018, a few shorter episodes since then General education again reviewed with pt re afib, reassured He is happy right now with increase of metoprolol If  afib burden increases may need to move into antiarrythmic's, not a candidate for flecainide 2/2 LBBB at baseline On young side for amiodarone, may need tikosyn General review of afib, when he can treat at home and when it might be appropriate to go to ER Now on daily metoprolol 25 mg bid  He has  cardizem 30 mg as needed Continue eliquis for a chadsvasc score of 2 Bleeding precautions discussed

## 2018-08-05 ENCOUNTER — Telehealth: Payer: Self-pay | Admitting: *Deleted

## 2018-08-05 MED ORDER — METOPROLOL TARTRATE 25 MG PO TABS: 37.5000 mg | ORAL_TABLET | Freq: Two times a day (BID) | ORAL | 3 refills | Status: DC

## 2018-08-05 NOTE — Telephone Encounter (Signed)
Called patient RE results of myoview.  Reviewed results and answered all questions.  Call and reviewing information took 20 mins.  He recently had an episode of At Fib and Roderic Palau, NP instructed him to take his Metoprolol 25 mg 1 and 1/2 tablets twice a day.  Since that time his At Fib has been in control.  He does request a new RX be sent into [facility] and placed on hold.  He will call them when he needs it refilled.   Pt was very grateful for the time taken to explain his results and answer questions r/t At Fib.

## 2018-08-17 NOTE — Addendum Note (Signed)
Encounter addended by: Sherran Needs, NP on: 08/17/2018 10:28 AM  Actions taken: LOS modified

## 2018-08-30 ENCOUNTER — Telehealth (HOSPITAL_COMMUNITY): Payer: Self-pay | Admitting: *Deleted

## 2018-08-30 NOTE — Telephone Encounter (Signed)
Pt cld reporting HRs in the 130s and 140s.  Pt stated that this has been happening off and on since Friday.  Pt reports that his HR will go back down into the 60s.  Pt BP 132/80 then 138/93.  Pt instructed to take metoprolol 50 mg bid while HR in the 130s and bp in the 130s and if HR goes back down in the 60s and 70s or BP drops below 100 he should go back down to his 37.5 mg bid he is currently taking. Pt put on schedule for Thurs.  Pt will call back tomorrow

## 2018-09-01 ENCOUNTER — Telehealth: Payer: Self-pay | Admitting: Pharmacist

## 2018-09-01 ENCOUNTER — Other Ambulatory Visit (HOSPITAL_COMMUNITY): Payer: Self-pay | Admitting: *Deleted

## 2018-09-01 ENCOUNTER — Ambulatory Visit (HOSPITAL_COMMUNITY)
Admission: RE | Admit: 2018-09-01 | Discharge: 2018-09-01 | Disposition: A | Discharge status: Home / Self Care | Payer: Medicare Other | Source: Ambulatory Visit | Attending: Nurse Practitioner | Admitting: Nurse Practitioner

## 2018-09-01 ENCOUNTER — Encounter (HOSPITAL_COMMUNITY): Payer: Self-pay | Admitting: Nurse Practitioner

## 2018-09-01 VITALS — BP 126/72 | HR 53 | Ht 70.0 in | Wt 201.0 lb

## 2018-09-01 DIAGNOSIS — I480 Paroxysmal atrial fibrillation: Secondary | ICD-10-CM | POA: Diagnosis not present

## 2018-09-01 DIAGNOSIS — Z79899 Other long term (current) drug therapy: Secondary | ICD-10-CM | POA: Insufficient documentation

## 2018-09-01 DIAGNOSIS — M170 Bilateral primary osteoarthritis of knee: Secondary | ICD-10-CM | POA: Insufficient documentation

## 2018-09-01 DIAGNOSIS — I2510 Atherosclerotic heart disease of native coronary artery without angina pectoris: Secondary | ICD-10-CM | POA: Diagnosis not present

## 2018-09-01 DIAGNOSIS — Z7901 Long term (current) use of anticoagulants: Secondary | ICD-10-CM | POA: Insufficient documentation

## 2018-09-01 DIAGNOSIS — I10 Essential (primary) hypertension: Secondary | ICD-10-CM

## 2018-09-01 HISTORY — DX: Paroxysmal atrial fibrillation: I48.0

## 2018-09-01 LAB — BASIC METABOLIC PANEL
Anion gap: 10 (ref 5–15)
BUN: 14 mg/dL (ref 8–23)
CHLORIDE: 105 mmol/L (ref 98–111)
CO2: 23 mmol/L (ref 22–32)
Calcium: 9.4 mg/dL (ref 8.9–10.3)
Creatinine, Ser: 0.99 mg/dL (ref 0.61–1.24)
GFR calc Af Amer: >60 mL/min (ref >60)
GFR calc non Af Amer: >60 mL/min (ref >60)
GLUCOSE: 110 mg/dL — AB (ref 70–99)
POTASSIUM: 3.8 mmol/L (ref 3.5–5.1)
Sodium: 138 mmol/L (ref 135–145)

## 2018-09-01 LAB — MAGNESIUM: MAGNESIUM: 2.2 mg/dL (ref 1.7–2.4)

## 2018-09-01 MED ORDER — POTASSIUM CHLORIDE CRYS ER 20 MEQ PO TBCR: 20.0000 meq | EXTENDED_RELEASE_TABLET | Freq: Every day | ORAL | 3 refills | Status: DC

## 2018-09-01 NOTE — Progress Notes (Signed)
Primary Care Physician: Josetta Huddle, MD Primary Cardiologist: Marlou Porch Primary Electrophysiologist: Allred (to establish during Tikosyn load)  Bradley Gilbert is a 67 y.o. male with a history of paroxysmal atrial fibrillation who presents for follow up in the [facility].  He asked to be seen today as an add on 2/2 increased AF burden. His episodes are occurring more frequently and typically last 5-6 hours.  He is symptomatic with shortness of breath, dizziness, palpitations, and fatigue.  Today, he  denies symptoms of chest pain, orthopnea, PND, lower extremity edema, presyncope, syncope, snoring, daytime somnolence, bleeding, or neurologic sequela. The patient is tolerating medications without difficulties and is otherwise without complaint today.    Atrial Fibrillation Risk Factors:  he does not have symptoms or diagnosis of sleep apnea. Prior negative sleep study.   he does not have a history of rheumatic fever.  he does not have a history of alcohol use.  he has a BMI of Body mass index is 28.84 kg/m.Marland Kitchen Filed Weights   09/01/18 1141  Weight: 91.2 kg    LA size: 36   Atrial Fibrillation Management history:  Previous antiarrhythmic drugs: none  Previous cardioversions: none  Previous ablations: none  CHADS2VASC score: 2  Anticoagulation history: Eliquis   Past Medical History:  Diagnosis Date  . Allergic rhinitis   . Coronary artery disease    Cath 08/15/11 with 30% mid LAD, o/w no CAD  . Hearing loss   . History of colon polyps    BENGIN  . Left rotator cuff tear   . OA (osteoarthritis)    KNEES AND LEFT SHOULDER  . Paroxysmal atrial fibrillation [facility])    Past Surgical History:  Procedure Laterality Date  . 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 CARPAL TUNNEL  RELEASE Right 1990  . COLONOSCOPY    . KNEE ARTHROSCOPY Right YRS AGO  . LAPAROSCOPIC INGUINAL HERNIA REPAIR Right 09-05-2011  . POLYPECTOMY    . SHOULDER ARTHROSCOPY WITH SUBACROMIAL DECOMPRESSION AND BICEP TENDON REPAIR Left 05/12/2014   Procedure: LEFT ARTHROSCOPY SHOULDER WITH EXAM UNDER ANESTHESIA,SUBACROMIAL DECOMPRESSION DISTAL CLAVICAL RESECTION LABIAL REPAIR VERSES DEBRIDEMENT;  Surgeon: Sydnee Cabal, MD;  Location: [place];  Service: Orthopedics;  Laterality: Left;  . TRANSTHORACIC ECHOCARDIOGRAM  08-19-2011   MILD LVH/  EF 15-40%/  GRADE I DIASTOLIC DYSFUNCTION    Current Outpatient Medications  Medication Sig Dispense Refill  . atorvastatin (LIPITOR) 20 MG tablet Take 1 tablet (20 mg total) by mouth daily. 90 tablet 3  . ELIQUIS 5 MG TABS tablet TAKE 1 TABLET TWICE DAILY 60 tablet 10  . metoprolol tartrate (LOPRESSOR) 25 MG tablet Take 1.5 tablets (37.5 mg total) by mouth 2 (two) times daily. 270 tablet 3  . diltiazem (CARDIZEM) 30 MG tablet Take 30 mg by mouth as directed. Take one tablet by mouth every 4 hours AS NEEDED for A-fib HR > 100 as long as BP > 100.     No current facility-administered medications for this encounter.     Allergies  Allergen Reactions  . Shellfish Allergy Anaphylaxis    Throat, face, and Eyes swell , severe headache    Social History   Socioeconomic History  . Marital status: Married    Spouse name: Not on file  . Number  of children: Not on file  . Years of education: Not on file  . Highest education level: Not on file  Occupational History  . 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 Financial resource strain: Not on file  . Food insecurity:    Worry: Not on file    Inability: Not on file  . Transportation needs:    Medical: Not on file    Non-medical: Not on file  Tobacco Use  . Smoking status: Never Smoker  . Smokeless tobacco: Never Used  Substance and Sexual Activity  .  Alcohol use: No    Alcohol/week: 0.0 standard drinks  . Drug use: No  . Sexual activity: Not on file  Lifestyle  . Physical activity:    Days per week: Not on file    Minutes per session: Not on file  . Stress: Not on file  Relationships  . Social connections:    Talks on phone: Not on file    Gets together: Not on file    Attends religious service: Not on file    Active member of club or organization: Not on file    Attends meetings of clubs or organizations: Not on file    Relationship status: Not on file  . Intimate partner violence:    Fear of current or ex partner: Not on file    Emotionally abused: Not on file    Physically abused: Not on file    Forced sexual activity: Not on file  Other Topics Concern  . Not on file  Social History Narrative   Graduate of [employer].  Married - '85.  1 son - '70,  2 daughters - '74, '87;   5 grandchildren.  Hobbies - keeps horses and ponies.  Marriage - good health             Family History  Problem Relation Age of Onset  . Diabetes Mother   . Heart disease Father   . Cancer Father        throat  . COPD Father        throat  . Colon cancer Neg Hx   . Prostate cancer Neg Hx   . Rectal cancer Neg Hx   . Stomach cancer Neg Hx     ROS- All systems are reviewed and negative except as per the HPI above.  Physical Exam: Vitals:   09/01/18 1141  BP: 126/72  Pulse: (!) 53  Weight: 91.2 kg  Height: 5\' 10"  (1.778 m)    GEN- The patient is well appearing, alert and oriented x 3 today.   Head- normocephalic, atraumatic Eyes-  Sclera clear, conjunctiva pink Ears- hearing intact Oropharynx- clear Neck- supple  Lungs- Clear to ausculation bilaterally, normal work of breathing Heart- Bradycardic regular rate and rhythm  GI- soft, NT, ND, + BS Extremities- no clubbing, cyanosis, or edema MS- no significant deformity or atrophy Skin- no rash or lesion Psych- euthymic mood, full affect Neuro- strength and sensation are  intact  Wt Readings from Last 3 Encounters:  09/01/18 91.2 kg  08/02/18 90.7 kg  07/29/18 89.8 kg    EKG today demonstrates sinus bradycardia, rate 53, PR 157msec, QRS 188msec, QTc 465msec  Epic records are reviewed at length today  Assessment and Plan:  1. Paroxysmal atrial fibrillation Episodes are increasing in frequency and are symptomatic Continue Eliquis for CHADS2VASC of 2 We discussed options at length today. Will plan Tikosyn load for next  week. He has interest in ablation. Will plan to admit on Tuesday so he can meet Dr Rayann Heman in case he fails Tikosyn.   2. HTN Stable No change required today  Plan Tikosyn load next week.    Chanetta Marshall, NP 09/01/2018 12:18 PM

## 2018-09-01 NOTE — Telephone Encounter (Signed)
Medication list reviewed in anticipation of upcoming Tikosyn initiation. Patient is not taking any contraindicated or QTc prolonging medications.   Patient is anticoagulated on Eliquis 5mg BID on the appropriate dose. Please ensure that patient has not missed any anticoagulation doses in the 3 weeks prior to Tikosyn initiation.   Patient will need to be counseled to avoid use of Benadryl while on Tikosyn and in the 2-3 days prior to Tikosyn initiation. 

## 2018-09-02 ENCOUNTER — Ambulatory Visit (HOSPITAL_COMMUNITY): Payer: Medicare Other | Admitting: Nurse Practitioner

## 2018-09-07 ENCOUNTER — Encounter (HOSPITAL_COMMUNITY): Payer: Self-pay | Admitting: Nurse Practitioner

## 2018-09-07 ENCOUNTER — Other Ambulatory Visit: Payer: Self-pay

## 2018-09-07 ENCOUNTER — Inpatient Hospital Stay (HOSPITAL_COMMUNITY)
Admission: AD | Admit: 2018-09-07 | Discharge: 2018-09-10 | DRG: 310 | Disposition: A | Discharge status: Home / Self Care | Payer: Medicare Other | Source: Ambulatory Visit | Attending: Internal Medicine | Admitting: Internal Medicine

## 2018-09-07 ENCOUNTER — Ambulatory Visit (HOSPITAL_COMMUNITY)
Admission: RE | Admit: 2018-09-07 | Discharge: 2018-09-07 | Disposition: A | Discharge status: Home / Self Care | Payer: Medicare Other | Source: Ambulatory Visit | Attending: Nurse Practitioner | Admitting: Nurse Practitioner

## 2018-09-07 VITALS — BP 128/74 | HR 51 | Ht 70.0 in | Wt 201.0 lb

## 2018-09-07 DIAGNOSIS — I4819 Other persistent atrial fibrillation: Secondary | ICD-10-CM

## 2018-09-07 DIAGNOSIS — Z7901 Long term (current) use of anticoagulants: Secondary | ICD-10-CM

## 2018-09-07 DIAGNOSIS — R001 Bradycardia, unspecified: Secondary | ICD-10-CM | POA: Diagnosis not present

## 2018-09-07 DIAGNOSIS — M170 Bilateral primary osteoarthritis of knee: Secondary | ICD-10-CM | POA: Diagnosis present

## 2018-09-07 DIAGNOSIS — Z833 Family history of diabetes mellitus: Secondary | ICD-10-CM

## 2018-09-07 DIAGNOSIS — E785 Hyperlipidemia, unspecified: Secondary | ICD-10-CM | POA: Diagnosis present

## 2018-09-07 DIAGNOSIS — Z91013 Allergy to seafood: Secondary | ICD-10-CM | POA: Diagnosis not present

## 2018-09-07 DIAGNOSIS — Z8719 Personal history of other diseases of the digestive system: Secondary | ICD-10-CM | POA: Diagnosis not present

## 2018-09-07 DIAGNOSIS — Z808 Family history of malignant neoplasm of other organs or systems: Secondary | ICD-10-CM

## 2018-09-07 DIAGNOSIS — I447 Left bundle-branch block, unspecified: Secondary | ICD-10-CM | POA: Diagnosis present

## 2018-09-07 DIAGNOSIS — H9190 Unspecified hearing loss, unspecified ear: Secondary | ICD-10-CM | POA: Diagnosis present

## 2018-09-07 DIAGNOSIS — Z8249 Family history of ischemic heart disease and other diseases of the circulatory system: Secondary | ICD-10-CM

## 2018-09-07 DIAGNOSIS — I2510 Atherosclerotic heart disease of native coronary artery without angina pectoris: Secondary | ICD-10-CM | POA: Diagnosis present

## 2018-09-07 DIAGNOSIS — I10 Essential (primary) hypertension: Secondary | ICD-10-CM | POA: Diagnosis present

## 2018-09-07 DIAGNOSIS — I480 Paroxysmal atrial fibrillation: Secondary | ICD-10-CM | POA: Diagnosis present

## 2018-09-07 DIAGNOSIS — M19012 Primary osteoarthritis, left shoulder: Secondary | ICD-10-CM | POA: Diagnosis present

## 2018-09-07 LAB — BASIC METABOLIC PANEL
Anion gap: 9 (ref 5–15)
BUN: 17 mg/dL (ref 8–23)
CALCIUM: 9.5 mg/dL (ref 8.9–10.3)
CHLORIDE: 105 mmol/L (ref 98–111)
CO2: 25 mmol/L (ref 22–32)
CREATININE: 1.04 mg/dL (ref 0.61–1.24)
GFR calc Af Amer: >60 mL/min (ref >60)
Glucose, Bld: 110 mg/dL — ABNORMAL HIGH (ref 70–99)
Potassium: 4.4 mmol/L (ref 3.5–5.1)
SODIUM: 139 mmol/L (ref 135–145)

## 2018-09-07 LAB — MAGNESIUM: MAGNESIUM: 2.3 mg/dL (ref 1.7–2.4)

## 2018-09-07 MED ORDER — SODIUM CHLORIDE 0.9% FLUSH
3.0000 mL | Freq: Two times a day (BID) | INTRAVENOUS | Status: DC
  Administered 2018-09-07 – 2018-09-10 (×6): 3 mL via INTRAVENOUS

## 2018-09-07 MED ORDER — ATORVASTATIN CALCIUM 20 MG PO TABS
20.0000 mg | ORAL_TABLET | Freq: Every day | ORAL | Status: DC
  Administered 2018-09-08 – 2018-09-10 (×3): 20 mg via ORAL
  Filled 2018-09-07 (×3): qty 1

## 2018-09-07 MED ORDER — POTASSIUM CHLORIDE CRYS ER 20 MEQ PO TBCR
20.0000 meq | EXTENDED_RELEASE_TABLET | Freq: Every day | ORAL | Status: DC
  Administered 2018-09-08 – 2018-09-10 (×3): 20 meq via ORAL
  Filled 2018-09-07 (×3): qty 1

## 2018-09-07 MED ORDER — SODIUM CHLORIDE 0.9 % IV SOLN: 250.0000 mL | INTRAVENOUS | Status: DC | PRN

## 2018-09-07 MED ORDER — METOPROLOL TARTRATE 25 MG PO TABS
37.5000 mg | ORAL_TABLET | Freq: Two times a day (BID) | ORAL | Status: DC
  Administered 2018-09-07 – 2018-09-08 (×3): 37.5 mg via ORAL
  Filled 2018-09-07 (×4): qty 1

## 2018-09-07 MED ORDER — DOFETILIDE 500 MCG PO CAPS
500.0000 ug | ORAL_CAPSULE | Freq: Two times a day (BID) | ORAL | Status: DC
  Administered 2018-09-07 – 2018-09-10 (×6): 500 ug via ORAL
  Filled 2018-09-07 (×6): qty 1

## 2018-09-07 MED ORDER — SODIUM CHLORIDE 0.9% FLUSH: 3.0000 mL | INTRAVENOUS | Status: DC | PRN

## 2018-09-07 MED ORDER — APIXABAN 5 MG PO TABS
5.0000 mg | ORAL_TABLET | Freq: Two times a day (BID) | ORAL | Status: DC
  Administered 2018-09-07 – 2018-09-10 (×6): 5 mg via ORAL
  Filled 2018-09-07 (×6): qty 1

## 2018-09-07 NOTE — H&P (Signed)
Primary Care Physician: Josetta Huddle, MD Primary Cardiologist: Marlou Porch Primary Electrophysiologist: Mable Lashley (to establish during Tikosyn load)  Bradley Gilbert is a 67 y.o. male with a history of paroxysmal atrial fibrillation who presents for tikosyn admission in the [facility].  He has noted an increase in afib burden and is very sym[ptomatic with his usual RVR around 150 bpm..  His episodes are occurring more frequently and typically last 5-6 hours.  He is symptomatic with shortness of breath, dizziness, palpitations, and fatigue.    Today, he  denies symptoms of chest pain, orthopnea, PND, lower extremity edema, presyncope, syncope, snoring, daytime somnolence, bleeding, or neurologic sequela. The patient is tolerating medications without difficulties and is otherwise without complaint today.    Atrial Fibrillation Risk Factors:  he does not have symptoms or diagnosis of sleep apnea. Prior negative sleep study.   he does not have a history of rheumatic fever.  he does not have a history of alcohol use.  he has a BMI of Body mass index is 28.98 kg/m.Marland Kitchen Filed Weights   09/07/18 1713  Weight: 91.6 kg    LA size: 36   Atrial Fibrillation Management history:  Previous antiarrhythmic drugs: none  Previous cardioversions: none  Previous ablations: none  CHADS2VASC score: 2  Anticoagulation history: Eliquis   Past Medical History:  Diagnosis Date  . Allergic rhinitis   . Coronary artery disease    Cath 08/15/11 with 30% mid LAD, o/w no CAD  . Hearing loss   . History of colon polyps    BENGIN  . Left rotator cuff tear   . OA (osteoarthritis)    KNEES AND LEFT SHOULDER  . Paroxysmal atrial fibrillation [facility])    Past Surgical History:  Procedure Laterality Date  . 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 CARPAL TUNNEL RELEASE Right 1990  . COLONOSCOPY    . KNEE ARTHROSCOPY Right YRS AGO  . LAPAROSCOPIC INGUINAL HERNIA REPAIR Right 09-05-2011  . POLYPECTOMY    . SHOULDER ARTHROSCOPY WITH SUBACROMIAL DECOMPRESSION AND BICEP TENDON REPAIR Left 05/12/2014   Procedure: LEFT ARTHROSCOPY SHOULDER WITH EXAM UNDER ANESTHESIA,SUBACROMIAL DECOMPRESSION DISTAL CLAVICAL RESECTION LABIAL REPAIR VERSES DEBRIDEMENT;  Surgeon: Sydnee Cabal, MD;  Location: [place];  Service: Orthopedics;  Laterality: Left;  . TRANSTHORACIC ECHOCARDIOGRAM  08-19-2011   MILD LVH/  EF 36-62%/  GRADE I DIASTOLIC DYSFUNCTION    No current facility-administered medications for this encounter.     Allergies  Allergen Reactions  . Shellfish Allergy Anaphylaxis    Throat, face, and Eyes swell , severe headache    Social History   Socioeconomic History  . Marital status: Married    Spouse name: Not on file  . Number of children: Not on file  . Years of education: Not on file  . Highest education level: Not on file  Occupational History  . 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 Financial resource strain: Not on file  . Food insecurity:    Worry: Not on file    Inability: Not on file  . Transportation needs:    Medical: Not on file    Non-medical: Not on file  Tobacco Use  . Smoking status: Never Smoker  . Smokeless tobacco: Never  Used  Substance and Sexual Activity  . Alcohol use: No    Alcohol/week: 0.0 standard drinks  . Drug use: No  . Sexual activity: Not on file  Lifestyle  . Physical activity:    Days per week: Not on file    Minutes per session: Not on file  . Stress: Not on file  Relationships  . Social connections:    Talks on phone: Not on file    Gets together: Not on file    Attends religious service: Not on file    Active member of club or organization: Not on file    Attends meetings of clubs or  organizations: Not on file    Relationship status: Not on file  . Intimate partner violence:    Fear of current or ex partner: Not on file    Emotionally abused: Not on file    Physically abused: Not on file    Forced sexual activity: Not on file  Other Topics Concern  . Not on file  Social History Narrative   Graduate of [employer].  Married - '85.  1 son - '70,  2 daughters - '74, '87;   5 grandchildren.  Hobbies - keeps horses and ponies.  Marriage - good health             Family History  Problem Relation Age of Onset  . Diabetes Mother   . Heart disease Father   . Cancer Father        throat  . COPD Father        throat  . Colon cancer Neg Hx   . Prostate cancer Neg Hx   . Rectal cancer Neg Hx   . Stomach cancer Neg Hx     ROS- All systems are reviewed and negative except as per the HPI above.  Physical Exam: Vitals:   09/07/18 1713  BP: 130/84  Pulse: 63  Temp: 98.5 F (36.9 C)  TempSrc: Oral  SpO2: 97%  Weight: 91.6 kg  Height: 5\' 10"  (1.778 m)    GEN- The patient is well appearing, alert and oriented x 3 today.   Head- normocephalic, atraumatic Eyes-  Sclera clear, conjunctiva pink Ears- hearing intact Oropharynx- clear Neck- supple  Lungs- Clear to ausculation bilaterally, normal work of breathing Heart- Bradycardic regular rate and rhythm  GI- soft, NT, ND, + BS Extremities- no clubbing, cyanosis, or edema MS- no significant deformity or atrophy Skin- no rash or lesion Psych- euthymic mood, full affect Neuro- strength and sensation are intact  Wt Readings from Last 3 Encounters:  09/07/18 91.6 kg  09/07/18 91.2 kg  09/01/18 91.2 kg    EKG today demonstrates sinus bradycardia, rate 51, LBBB, PR 176 msec, QRS 134msec, QTc 440 msec  Epic records are reviewed at length today  Assessment and Plan:  1. Paroxysmal atrial fibrillation For tikosyn admit Will be applying thru pt assistance Episodes are increasing in frequency and are  symptomatic Continue Eliquis for CHADS2VASC of 2, no missed doses PharmD screened drugs and there are no qt prolonging drugs No benadryl use Bmet/mag today and stable to admit for tikosyn  2. HTN Stable No change required today  Will be notified when bed is ready for admission   Thompson Grayer, MD 09/07/2018 5:15 PM  I have seen, examined the patient, and reviewed the above assessment and plan.  Changes to above are made where necessary.  On exam, RRR.  Will admit for initiation of tikosyn.  He  reports compliance with eliquis without interruption.  Co Sign: Thompson Grayer, MD 09/07/2018

## 2018-09-07 NOTE — Progress Notes (Addendum)
Primary Care Physician: Josetta Huddle, MD Primary Cardiologist: Marlou Porch Primary Electrophysiologist: Allred (to establish during Tikosyn load)  Bradley Gilbert is a 67 y.o. male with a history of paroxysmal atrial fibrillation who presents for tikosyn admission in the [facility].  He has noted an increase in afib burden and is very sym[ptomatic with his usual RVR around 150 bpm..  His episodes are occurring more frequently and typically last 5-6 hours.  He is symptomatic with shortness of breath, dizziness, palpitations, and fatigue.    Today, he  denies symptoms of chest pain, orthopnea, PND, lower extremity edema, presyncope, syncope, snoring, daytime somnolence, bleeding, or neurologic sequela. The patient is tolerating medications without difficulties and is otherwise without complaint today.    Atrial Fibrillation Risk Factors:  he does not have symptoms or diagnosis of sleep apnea. Prior negative sleep study.   he does not have a history of rheumatic fever.  he does not have a history of alcohol use.  he has a BMI of Body mass index is 28.84 kg/m.Marland Kitchen Filed Weights   09/07/18 1109  Weight: 91.2 kg    LA size: 36   Atrial Fibrillation Management history:  Previous antiarrhythmic drugs: none  Previous cardioversions: none  Previous ablations: none  CHADS2VASC score: 2  Anticoagulation history: Eliquis   Past Medical History:  Diagnosis Date  . Allergic rhinitis   . Coronary artery disease    Cath 08/15/11 with 30% mid LAD, o/w no CAD  . Hearing loss   . History of colon polyps    BENGIN  . Left rotator cuff tear   . OA (osteoarthritis)    KNEES AND LEFT SHOULDER  . Paroxysmal atrial fibrillation [facility])    Past Surgical History:  Procedure Laterality Date  . 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 CARPAL TUNNEL RELEASE Right 1990  . COLONOSCOPY    . KNEE ARTHROSCOPY Right YRS AGO  . LAPAROSCOPIC INGUINAL HERNIA REPAIR Right 09-05-2011  . POLYPECTOMY    . SHOULDER ARTHROSCOPY WITH SUBACROMIAL DECOMPRESSION AND BICEP TENDON REPAIR Left 05/12/2014   Procedure: LEFT ARTHROSCOPY SHOULDER WITH EXAM UNDER ANESTHESIA,SUBACROMIAL DECOMPRESSION DISTAL CLAVICAL RESECTION LABIAL REPAIR VERSES DEBRIDEMENT;  Surgeon: Sydnee Cabal, MD;  Location: [place];  Service: Orthopedics;  Laterality: Left;  . TRANSTHORACIC ECHOCARDIOGRAM  08-19-2011   MILD LVH/  EF 32-99%/  GRADE I DIASTOLIC DYSFUNCTION    Current Outpatient Medications  Medication Sig Dispense Refill  . atorvastatin (LIPITOR) 20 MG tablet Take 1 tablet (20 mg total) by mouth daily. 90 tablet 3  . diltiazem (CARDIZEM) 30 MG tablet Take 30 mg by mouth as directed. Take one tablet by mouth every 4 hours AS NEEDED for A-fib HR > 100 as long as BP > 100.    Marland Kitchen ELIQUIS 5 MG TABS tablet TAKE 1 TABLET TWICE DAILY 60 tablet 10  . metoprolol tartrate (LOPRESSOR) 25 MG tablet Take 1.5 tablets (37.5 mg total) by mouth 2 (two) times daily. 270 tablet 3  . potassium chloride SA (K-DUR,KLOR-CON) 20 MEQ tablet Take 1 tablet (20 mEq total) by mouth daily. 30 tablet 3   No current facility-administered medications for this encounter.     Allergies  Allergen Reactions  . Shellfish Allergy Anaphylaxis    Throat, face, and Eyes  swell , severe headache    Social History   Socioeconomic History  . Marital status: Married    Spouse name: Not on file  . Number of children: Not on file  . Years of education: Not on file  . Highest education level: Not on file  Occupational History  . Occupation: Diet Therapist, music: [employer]  Social Needs  . Financial resource strain: Not on file  . Food insecurity:    Worry: Not on file    Inability: Not on file  . Transportation needs:     Medical: Not on file    Non-medical: Not on file  Tobacco Use  . Smoking status: Never Smoker  . Smokeless tobacco: Never Used  Substance and Sexual Activity  . Alcohol use: No    Alcohol/week: 0.0 standard drinks  . Drug use: No  . Sexual activity: Not on file  Lifestyle  . Physical activity:    Days per week: Not on file    Minutes per session: Not on file  . Stress: Not on file  Relationships  . Social connections:    Talks on phone: Not on file    Gets together: Not on file    Attends religious service: Not on file    Active member of club or organization: Not on file    Attends meetings of clubs or organizations: Not on file    Relationship status: Not on file  . Intimate partner violence:    Fear of current or ex partner: Not on file    Emotionally abused: Not on file    Physically abused: Not on file    Forced sexual activity: Not on file  Other Topics Concern  . Not on file  Social History Narrative   Graduate of [employer].  Married - '85.  1 son - '70,  2 daughters - '74, '87;   5 grandchildren.  Hobbies - keeps horses and ponies.  Marriage - good health             Family History  Problem Relation Age of Onset  . Diabetes Mother   . Heart disease Father   . Cancer Father        throat  . COPD Father        throat  . Colon cancer Neg Hx   . Prostate cancer Neg Hx   . Rectal cancer Neg Hx   . Stomach cancer Neg Hx     ROS- All systems are reviewed and negative except as per the HPI above.  Physical Exam: Vitals:   09/07/18 1109  BP: 128/74  Pulse: (!) 51  Weight: 91.2 kg  Height: 5\' 10"  (1.778 m)    GEN- The patient is well appearing, alert and oriented x 3 today.   Head- normocephalic, atraumatic Eyes-  Sclera clear, conjunctiva pink Ears- hearing intact Oropharynx- clear Neck- supple  Lungs- Clear to ausculation bilaterally, normal work of breathing Heart- Bradycardic regular rate and rhythm  GI- soft, NT, ND, + BS Extremities-  no clubbing, cyanosis, or edema MS- no significant deformity or atrophy Skin- no rash or lesion Psych- euthymic mood, full affect Neuro- strength and sensation are intact  Wt Readings from Last 3 Encounters:  09/07/18 91.2 kg  09/01/18 91.2 kg  08/02/18 90.7 kg    EKG today demonstrates sinus bradycardia, rate 51, LBBB, PR 176 msec, QRS 137msec, QTc 440 msec  Epic records are reviewed at length today  Assessment and  Plan:  1. Paroxysmal atrial fibrillation For tikosyn admit Will be applying thru pt assistance Episodes are increasing in frequency and are symptomatic Continue Eliquis for CHADS2VASC of 2, no missed doses PharmD screened drugs and there are no qt prolonging drugs No benadryl use Bmet/mag today and stable to admit for tikosyn  2. HTN Stable No change required today  Will be notified when bed is ready for admission   Roderic Palau, NP 09/07/2018 11:34 AM

## 2018-09-07 NOTE — Progress Notes (Signed)
Pharmacy Review for Dofetilide (Tikosyn) Initiation  Admit Complaint: 67 y.o. male admitted 09/07/2018 with atrial fibrillation to be initiated on dofetilide.   Assessment:  Patient Exclusion Criteria: If any screening criteria checked as "Yes", then  patient  should NOT receive dofetilide until criteria item is corrected. If "Yes" please indicate correction plan.  YES  NO Patient  Exclusion Criteria Correction Plan  [x]  []  Baseline QTc interval is greater than or equal to 440 msec. IF above YES box checked dofetilide contraindicated unless patient has ICD; then may proceed if QTc 500-550 msec or with known ventricular conduction abnormalities may proceed with QTc 550-600 msec. QTc = 0.44 w/ LBBB MD aware and okay to continue   []  [x]  Magnesium level is less than 1.8 mEq/l : Last magnesium:  Lab Results  Component Value Date   MG 2.3 09/07/2018         []  [x]  Potassium level is less than 4 mEq/l : Last potassium:  Lab Results  Component Value Date   K 4.4 09/07/2018         []  [x]  Patient is known or suspected to have a digoxin level greater than 2 ng/ml: No results found for: DIGOXIN    []  [x]  Creatinine clearance less than 20 ml/min (calculated using Cockcroft-Gault, actual body weight and serum creatinine): Estimated Creatinine Clearance: 77.3 mL/min (by C-G formula based on SCr of 1.04 mg/dL).    []  [x]  Patient has received drugs known to prolong the QT intervals within the last 48 hours (phenothiazines, tricyclics or tetracyclic antidepressants, erythromycin, H-1 antihistamines, cisapride, fluoroquinolones, azithromycin). Drugs not listed above may have an, as yet, undetected potential to prolong the QT interval, updated information on QT prolonging agents is available at this website:QT prolonging agents   []  [x]  Patient received a dose of hydrochlorothiazide (Oretic) alone or in any combination including triamterene (Dyazide, Maxzide) in the last 48 hours.   []  [x]  Patient  received a medication known to increase dofetilide plasma concentrations prior to initial dofetilide dose:  . Trimethoprim (Primsol, Proloprim) in the last 36 hours . Verapamil (Calan, Verelan) in the last 36 hours or a sustained release dose in the last 72 hours . Megestrol (Megace) in the last 5 days  . Cimetidine (Tagamet) in the last 6 hours . Ketoconazole (Nizoral) in the last 24 hours . Itraconazole (Sporanox) in the last 48 hours  . Prochlorperazine (Compazine) in the last 36 hours    []  [x]  Patient is known to have a history of torsades de pointes; congenital or acquired long QT syndromes.   []  [x]  Patient has received a Class 1 antiarrhythmic with less than 2 half-lives since last dose. (Disopyramide, Quinidine, Procainamide, Lidocaine, Mexiletine, Flecainide, Propafenone)   []  [x]  Patient has received amiodarone therapy in the past 3 months or amiodarone level is greater than 0.3 ng/ml.    Patient has been appropriately anticoagulated with apixaban.  Ordering provider was confirmed at [email or web address] if they are not listed on the [place] Prescribers list.  Goal of Therapy: Follow renal function, electrolytes, potential drug interactions, and dose adjustment. Provide education and 1 week supply at discharge.  Plan:  [x]   Physician selected initial dose within range recommended for patients level of renal function - will monitor for response.  []   Physician selected initial dose outside of range recommended for patients level of renal function - will discuss if the dose should be altered at this time.   Select One Calculated CrCl  Dose q12h  [  x] > 60 ml/min 500 mcg  []  40-60 ml/min 250 mcg  []  20-40 ml/min 125 mcg   2. Follow up QTc after the first 5 doses, renal function, electrolytes (K & Mg) daily x 3 days, dose adjustment, success of initiation and facilitate 1 week discharge supply as clinically indicated.  3. Initiate Tikosyn education video (Call [phone] and  ask for video # 116).  4. Place Enrollment Form on the chart for discharge supply of dofetilide.   Doylene Canard, PharmD Clinical Pharmacist  Pager: [phone] Phone: [phone]  5:53 PM 09/07/2018

## 2018-09-08 ENCOUNTER — Other Ambulatory Visit: Payer: Self-pay

## 2018-09-08 ENCOUNTER — Encounter (HOSPITAL_COMMUNITY): Payer: Self-pay | Admitting: *Deleted

## 2018-09-08 DIAGNOSIS — I480 Paroxysmal atrial fibrillation: Principal | ICD-10-CM

## 2018-09-08 LAB — BASIC METABOLIC PANEL
ANION GAP: 9 (ref 5–15)
BUN: 17 mg/dL (ref 8–23)
CHLORIDE: 104 mmol/L (ref 98–111)
CO2: 25 mmol/L (ref 22–32)
Calcium: 9.4 mg/dL (ref 8.9–10.3)
Creatinine, Ser: 1.14 mg/dL (ref 0.61–1.24)
GFR calc Af Amer: >60 mL/min (ref >60)
GFR calc non Af Amer: >60 mL/min (ref >60)
GLUCOSE: 114 mg/dL — AB (ref 70–99)
Potassium: 4 mmol/L (ref 3.5–5.1)
Sodium: 138 mmol/L (ref 135–145)

## 2018-09-08 LAB — MAGNESIUM: MAGNESIUM: 2.3 mg/dL (ref 1.7–2.4)

## 2018-09-08 NOTE — Progress Notes (Addendum)
   Electrophysiology Rounding Note  Patient Name: Bradley Gilbert Date of Encounter: 09/08/2018  Primary Cardiologist: Marlou Porch Electrophysiologist: Wilbert Schouten   Subjective   The patient is doing well today.  At this time, the patient denies chest pain, shortness of breath, or any new concerns.  Inpatient Medications    Scheduled Meds: . apixaban  5 mg Oral BID  . atorvastatin  20 mg Oral Daily  . dofetilide  500 mcg Oral BID  . metoprolol tartrate  37.5 mg Oral BID  . potassium chloride SA  20 mEq Oral Daily  . sodium chloride flush  3 mL Intravenous Q12H   Continuous Infusions: . sodium chloride     PRN Meds: sodium chloride, sodium chloride flush   Vital Signs    Vitals:   09/07/18 2130 09/08/18 0621 09/08/18 0800 09/08/18 0814  BP: (!) 146/84 105/68 121/78   Pulse: 63 (!) 49 (!) 57 (!) 52  Resp:   15   Temp:  97.6 F (36.4 C) 98.2 F (36.8 C)   TempSrc:  Oral Oral   SpO2:  100% 100%   Weight:  87.9 kg    Height:        Intake/Output Summary (Last 24 hours) at 09/08/2018 0938 Last data filed at 09/08/2018 0917 Gross per 24 hour  Intake 240 ml  Output -  Net 240 ml   Filed Weights   09/07/18 1713 09/08/18 0621  Weight: 91.6 kg 87.9 kg    Physical Exam    GEN- The patient is well appearing, alert and oriented x 3 today.   Head- normocephalic, atraumatic Eyes-  Sclera clear, conjunctiva pink Ears- hearing intact Oropharynx- clear Neck- supple Lungs- Clear to ausculation bilaterally, normal work of breathing Heart- Regular rate and rhythm  GI- soft, NT, ND, + BS Extremities- no clubbing, cyanosis, or edema Skin- no rash or lesion Psych- euthymic mood, full affect Neuro- strength and sensation are intact  Labs    Basic Metabolic Panel Recent Labs    09/07/18 1119 09/08/18 0343  NA 139 138  K 4.4 4.0  CL 105 104  CO2 25 25  GLUCOSE 110* 114*  BUN 17 17  CREATININE 1.04 1.14  CALCIUM 9.5 9.4  MG 2.3 2.3     Telemetry    SR  (personally reviewed)  Radiology    No results found.   Patient Profile     WLLIAM Gilbert is a 67 y.o. male admitted for Tikosyn load  Assessment & Plan    1.  Paroxysmal atrial fibrillation Admitted for Tikosyn load QTc, BMET, Mg stable Continue Eliquis for CHADS2VASC of 2  2.  HTN Stable No change required today   For questions or updates, please contact [place] Please consult [email or web address] for contact info under Cardiology/STEMI.  Signed, Chanetta Marshall, NP  09/08/2018, 9:38 AM    I have seen, examined the patient, and reviewed the above assessment and plan.  Changes to above are made where necessary.  On exam, RRR.  Continue tikosyn Qt is stable.  Co Sign: Thompson Grayer, MD 09/08/2018 9:28 PM

## 2018-09-08 NOTE — Care Management (Signed)
#    3.  [facility]  @ [place] RX # [phone]  1. TIKOSYN  125 MCG   250 MCG   500 MCG BID [place]  # [phone]  2. DOFETILIDE  125 MC BID COVER- YES CO-PAY- $ 100.00  NO Q/L TIER- 4 DRUG PRIOR APPROVAL- NO  3. DOFETILIDE  250 MCG BID COVER- YES CO-PAY- $ 100.00  NO Q/L TIER- 4 DRUG PRIOR APPROVAL- NO  4. DOFETILIDE  500 MCG BID COVER- YES CO-PAY- $ 100.00  NO Q/L TIER- 4 DRUG PRIOR APPROVAL- NO  PERFERRED PHARMACY : YES [facility] M/O 90 DAY SUPPLY  FOR M/O $200.00  AND RETAIL  $ 300.00

## 2018-09-09 DIAGNOSIS — R001 Bradycardia, unspecified: Secondary | ICD-10-CM

## 2018-09-09 LAB — BASIC METABOLIC PANEL
Anion gap: 6 (ref 5–15)
BUN: 15 mg/dL (ref 8–23)
CALCIUM: 9.4 mg/dL (ref 8.9–10.3)
CO2: 27 mmol/L (ref 22–32)
CREATININE: 1.14 mg/dL (ref 0.61–1.24)
Chloride: 105 mmol/L (ref 98–111)
GFR calc Af Amer: >60 mL/min (ref >60)
GFR calc non Af Amer: >60 mL/min (ref >60)
GLUCOSE: 113 mg/dL — AB (ref 70–99)
Potassium: 3.8 mmol/L (ref 3.5–5.1)
Sodium: 138 mmol/L (ref 135–145)

## 2018-09-09 LAB — MAGNESIUM: Magnesium: 2.3 mg/dL (ref 1.7–2.4)

## 2018-09-09 MED ORDER — METOPROLOL TARTRATE 25 MG PO TABS
25.0000 mg | ORAL_TABLET | Freq: Two times a day (BID) | ORAL | Status: DC
  Filled 2018-09-09: qty 1

## 2018-09-09 NOTE — Progress Notes (Signed)
Patient heart rate primarily in the mid 50's at this time.  Cardiology paged and updated.  Hold evening scheduled dose of Metoprolol per Dr. Charlaine Dalton (Cardiology on call).

## 2018-09-09 NOTE — Care Management Note (Addendum)
Case Management Note  Patient Details  Name: Bradley Gilbert MRN: [id number] Date of Birth: December 05, 1950  Subjective/Objective: Pt presented for Tikosyn. Persistent Atrial Fib. PTA Independent from home. Per pt the Atrial Fib Clinic discussed with him that he will get a 2 week supply of Tikosyn. Pt is agreeable for a 2 week supply of Tikosyn via [facility] to deliver the medication to the bedside. MD please escribe Rx for 14 day supply to [facility] and the original Rx with refills.                     Action/Plan: Per patient the A fib clinic has initiated patient assistance application for the patient. No further needs identified at this time.   Expected Discharge Date:                  Expected Discharge Plan:  Home/Self Care  In-House Referral:  NA  Discharge planning Services  CM Consult, Medication Assistance  Post Acute Care Choice:  NA Choice offered to:  NA  DME Arranged:  N/A DME Agency:  NA  HH Arranged:  NA HH Agency:  NA  Status of Service:  Completed, signed off  If discussed at [place] of Stay Meetings, dates discussed:    Additional Comments:  Bethena Roys, RN 09/09/2018, 2:50 PM

## 2018-09-09 NOTE — Progress Notes (Addendum)
   Electrophysiology Rounding Note  Patient Name: Bradley Gilbert Date of Encounter: 09/09/2018  Primary Cardiologist: Marlou Porch Electrophysiologist: Cheryal Salas   Subjective   The patient is doing well today.  At this time, the patient denies chest pain, shortness of breath, or any new concerns.  Inpatient Medications    Scheduled Meds: . apixaban  5 mg Oral BID  . atorvastatin  20 mg Oral Daily  . dofetilide  500 mcg Oral BID  . metoprolol tartrate  25 mg Oral BID  . potassium chloride SA  20 mEq Oral Daily  . sodium chloride flush  3 mL Intravenous Q12H   Continuous Infusions: . sodium chloride     PRN Meds: sodium chloride, sodium chloride flush   Vital Signs    Vitals:   09/08/18 1300 09/08/18 2113 09/08/18 2141 09/09/18 0531  BP: 109/71 119/67 116/81 108/74  Pulse:  (!) 51 (!) 55 (!) 54  Resp:  17  18  Temp: 97.6 F (36.4 C) 97.6 F (36.4 C)  (!) 97.5 F (36.4 C)  TempSrc: Oral Oral  Oral  SpO2: 98% 97%  97%  Weight:    88.9 kg  Height:        Intake/Output Summary (Last 24 hours) at 09/09/2018 0831 Last data filed at 09/08/2018 2000 Gross per 24 hour  Intake 600 ml  Output 3 ml  Net 597 ml   Filed Weights   09/07/18 1713 09/08/18 0621 09/09/18 0531  Weight: 91.6 kg 87.9 kg 88.9 kg    Physical Exam    GEN- The patient is well appearing, alert and oriented x 3 today.   Head- normocephalic, atraumatic Eyes-  Sclera clear, conjunctiva pink Ears- hearing intact Oropharynx- clear Neck- supple Lungs- Clear to ausculation bilaterally, normal work of breathing Heart- Regular rate and rhythm  GI- soft, NT, ND, + BS Extremities- no clubbing, cyanosis, or edema Skin- no rash or lesion Psych- euthymic mood, full affect Neuro- strength and sensation are intact  Labs    Basic Metabolic Panel Recent Labs    09/08/18 0343 09/09/18 0624  NA 138 138  K 4.0 3.8  CL 104 105  CO2 25 27  GLUCOSE 114* 113*  BUN 17 15  CREATININE 1.14 1.14  CALCIUM 9.4  9.4  MG 2.3 2.3    Telemetry    SR (personally reviewed)  Radiology    No results found.   Patient Profile     Bradley Gilbert is a 67 y.o. male admitted for Tikosyn load  Assessment & Plan    1.  Paroxysmal atrial fibrillation Admitted for tikosyn load QTc, Mg stable. K+ a little low, will check again tomorrow Continue Eliquis for CHADS2VASC of 2  2.  HTN Stable No change required today  3.  Hyperlipidemia Was scheduled for lipids/LFT's on Monday, will draw while here  4.  Sinus bradycardia Will decrease Metoprolol back to 25mg  twice daily  Plan to discharge home tomorrow  For questions or updates, please contact [place] Please consult [email or web address] for contact info under Cardiology/STEMI.  Signed, Chanetta Marshall, NP  09/09/2018, 8:31 AM   I have seen, examined the patient, and reviewed the above assessment and plan.  Changes to above are made where necessary.  On exam, RRR.   Ample time given to answer multiple questions.  Qt is stable.  Will plan discharge tomorrow.  Co Sign: Thompson Grayer, MD 09/09/2018 10:08 AM

## 2018-09-10 ENCOUNTER — Other Ambulatory Visit: Payer: Self-pay

## 2018-09-10 LAB — LIPID PANEL
CHOLESTEROL: 105 mg/dL (ref 0–200)
HDL: 37 mg/dL — ABNORMAL LOW (ref >40)
LDL Cholesterol: 52 mg/dL (ref 0–99)
Total CHOL/HDL Ratio: 2.8 RATIO
Triglycerides: 81 mg/dL (ref <150)
VLDL: 16 mg/dL (ref 0–40)

## 2018-09-10 LAB — HEPATIC FUNCTION PANEL
ALT: 71 U/L — ABNORMAL HIGH (ref 0–44)
AST: 45 U/L — ABNORMAL HIGH (ref 15–41)
Albumin: 3.9 g/dL (ref 3.5–5.0)
Alkaline Phosphatase: 38 U/L (ref 38–126)
Bilirubin, Direct: 0.2 mg/dL (ref 0.0–0.2)
Indirect Bilirubin: 1 mg/dL — ABNORMAL HIGH (ref 0.3–0.9)
Total Bilirubin: 1.2 mg/dL (ref 0.3–1.2)
Total Protein: 6.5 g/dL (ref 6.5–8.1)

## 2018-09-10 LAB — MAGNESIUM: Magnesium: 2.2 mg/dL (ref 1.7–2.4)

## 2018-09-10 LAB — BASIC METABOLIC PANEL
Anion gap: 8 (ref 5–15)
BUN: 19 mg/dL (ref 8–23)
CO2: 25 mmol/L (ref 22–32)
CREATININE: 1.19 mg/dL (ref 0.61–1.24)
Calcium: 9.4 mg/dL (ref 8.9–10.3)
Chloride: 104 mmol/L (ref 98–111)
GFR calc Af Amer: >60 mL/min (ref >60)
GFR calc non Af Amer: >60 mL/min (ref >60)
GLUCOSE: 109 mg/dL — AB (ref 70–99)
Potassium: 4 mmol/L (ref 3.5–5.1)
Sodium: 137 mmol/L (ref 135–145)

## 2018-09-10 MED ORDER — DOFETILIDE 500 MCG PO CAPS: 500.0000 ug | ORAL_CAPSULE | Freq: Two times a day (BID) | ORAL | 0 refills | Status: DC

## 2018-09-10 MED ORDER — DOFETILIDE 500 MCG PO CAPS: 500.0000 ug | ORAL_CAPSULE | Freq: Two times a day (BID) | ORAL | 1 refills | Status: DC

## 2018-09-10 MED FILL — TIKOSYN 500 MCG CAPS: 500 | 14 days supply | Qty: 28 | Fill #0

## 2018-09-10 NOTE — Progress Notes (Signed)
EKG Post tikosyn reviewed this morning. QTc 412msec.  EKG did not cross into the system. EKG in shadow chart.  Chanetta Marshall, NP 09/10/2018 11:33 AM

## 2018-09-10 NOTE — Discharge Summary (Addendum)
ELECTROPHYSIOLOGY PROCEDURE DISCHARGE SUMMARY    Patient ID: Bradley Gilbert,  MRN: [id number], DOB/AGE: [birth year] 67 y.o.  Admit date: 09/07/2018 Discharge date: 09/10/2018  Primary Care Physician: Josetta Huddle, MD Primary Cardiologist: [facility] Electrophysiologist: Juvencio Verdi  Primary Discharge Diagnosis:  1.  Paroxysmal atrial fibrillation status post Tikosyn loading this admission  Secondary Discharge Diagnosis:  1.  LBBB 2.  Sinus bradycardia 3.  CAD 4.  Arthritis  Allergies  Allergen Reactions  . Shellfish Allergy Anaphylaxis    Throat, face, and Eyes swell , severe headache     Procedures This Admission:  1.  Tikosyn loading  Brief HPI: Bradley Gilbert is a 67 y.o. male with a past medical history as noted above.  They were referred to EP in the outpatient setting for treatment options of atrial fibrillation.  Risks, benefits, and alternatives to Tikosyn were reviewed with the patient who wished to proceed.    Hospital Course:  The patient was admitted and Tikosyn was initiated.  Renal function and electrolytes were followed during the hospitalization.  Their QTc remained stable. They were monitored until discharge on telemetry which demonstrated sinus rhythm.  On the day of discharge, they were examined by Dr Rayann Heman who considered them stable for discharge to home.  Follow-up has been arranged with AF clinic in 1 week.  He was bradycardic during his hospital stay and Metoprolol was discontinued.   Physical Exam: Vitals:   09/09/18 1433 09/09/18 1957 09/10/18 0452 09/10/18 0452  BP: 113/70 131/88  116/78  Pulse: (!) 52 (!) 56  67  Resp:      Temp: 97.8 F (36.6 C) 97.8 F (36.6 C)  (!) 97.5 F (36.4 C)  TempSrc: Oral Oral  Oral  SpO2: 95% 100%  96%  Weight:   90 kg   Height:        GEN- The patient is well appearing, alert and oriented x 3 today.   HEENT: normocephalic, atraumatic; sclera clear, conjunctiva pink; hearing intact; oropharynx clear;  neck supple  Lungs- Clear to ausculation bilaterally, normal work of breathing.  No wheezes, rales, rhonchi Heart- Regular rate and rhythm  GI- soft, non-tender, non-distended, bowel sounds present  Extremities- no clubbing, cyanosis, or edema  MS- no significant deformity or atrophy Skin- warm and dry, no rash or lesion Psych- euthymic mood, full affect Neuro- strength and sensation are intact   Labs:   Lab Results  Component Value Date   WBC 8.6 07/04/2018   HGB 15.3 07/04/2018   HCT 45.4 07/04/2018   MCV 94.2 07/04/2018   PLT 195 07/04/2018    Recent Labs  Lab 09/10/18 0439  NA 137  K 4.0  CL 104  CO2 25  BUN 19  CREATININE 1.19  CALCIUM 9.4  PROT 6.5  BILITOT 1.2  ALKPHOS 38  ALT 71*  AST 45*  GLUCOSE 109*     Discharge Medications:  Allergies as of 09/10/2018      Reactions   Shellfish Allergy Anaphylaxis   Throat, face, and Eyes swell , severe headache      Medication List    STOP taking these medications   metoprolol tartrate 25 MG tablet Commonly known as:  LOPRESSOR     TAKE these medications   atorvastatin 20 MG tablet Commonly known as:  LIPITOR Take 1 tablet (20 mg total) by mouth daily.   diltiazem 30 MG tablet Commonly known as:  CARDIZEM Take 30 mg by mouth as directed. Take one tablet  by mouth every 4 hours AS NEEDED for A-fib HR > 100 as long as BP > 100.   dofetilide 500 MCG capsule Commonly known as:  TIKOSYN Take 1 capsule (500 mcg total) by mouth 2 (two) times daily.   ELIQUIS 5 MG Tabs tablet Generic drug:  apixaban TAKE 1 TABLET TWICE DAILY   magnesium oxide 400 MG tablet Commonly known as:  MAG-OX Take 400 mg by mouth daily.   multivitamin with minerals Tabs tablet Take 1 tablet by mouth daily.   potassium chloride SA 20 MEQ tablet Commonly known as:  K-DUR,KLOR-CON Take 1 tablet (20 mEq total) by mouth daily.       Disposition:  Discharge Instructions    Diet - low sodium heart healthy   Complete by:  As  directed    Increase activity slowly   Complete by:  As directed      Follow-up Information    Bradley Gilbert Follow up on 09/17/2018.   Specialty:  Cardiology Why:  at 9:30AM Contact information: [address] [id number] [place] [phone]          Duration of Discharge Encounter: Greater than 30 minutes including physician time.  Signed, Chanetta Marshall, NP 09/10/2018 8:41 AM  I have seen, examined the patient, and reviewed the above assessment and plan.  Changes to above are made where necessary.  On exam, RRR.  QT is stable.  DC to home with close outpatient follow-up in the AF clinic  Co Sign: Thompson Grayer, MD 09/10/2018 9:57 PM

## 2018-09-17 ENCOUNTER — Other Ambulatory Visit (HOSPITAL_COMMUNITY): Payer: Self-pay | Admitting: *Deleted

## 2018-09-17 ENCOUNTER — Encounter (HOSPITAL_COMMUNITY): Payer: Self-pay | Admitting: Nurse Practitioner

## 2018-09-17 ENCOUNTER — Ambulatory Visit (HOSPITAL_COMMUNITY)
Admission: RE | Admit: 2018-09-17 | Discharge: 2018-09-17 | Disposition: A | Discharge status: Home / Self Care | Payer: Medicare Other | Source: Ambulatory Visit | Attending: Nurse Practitioner | Admitting: Nurse Practitioner

## 2018-09-17 VITALS — BP 162/98 | HR 59 | Ht 70.0 in | Wt 199.6 lb

## 2018-09-17 DIAGNOSIS — Z79899 Other long term (current) drug therapy: Secondary | ICD-10-CM | POA: Diagnosis not present

## 2018-09-17 DIAGNOSIS — M170 Bilateral primary osteoarthritis of knee: Secondary | ICD-10-CM | POA: Insufficient documentation

## 2018-09-17 DIAGNOSIS — I454 Nonspecific intraventricular block: Secondary | ICD-10-CM | POA: Insufficient documentation

## 2018-09-17 DIAGNOSIS — M19012 Primary osteoarthritis, left shoulder: Secondary | ICD-10-CM | POA: Diagnosis not present

## 2018-09-17 DIAGNOSIS — R001 Bradycardia, unspecified: Secondary | ICD-10-CM | POA: Diagnosis not present

## 2018-09-17 DIAGNOSIS — Z7901 Long term (current) use of anticoagulants: Secondary | ICD-10-CM | POA: Insufficient documentation

## 2018-09-17 DIAGNOSIS — I4891 Unspecified atrial fibrillation: Secondary | ICD-10-CM | POA: Diagnosis present

## 2018-09-17 DIAGNOSIS — I447 Left bundle-branch block, unspecified: Secondary | ICD-10-CM | POA: Diagnosis not present

## 2018-09-17 DIAGNOSIS — I2510 Atherosclerotic heart disease of native coronary artery without angina pectoris: Secondary | ICD-10-CM | POA: Diagnosis not present

## 2018-09-17 DIAGNOSIS — I10 Essential (primary) hypertension: Secondary | ICD-10-CM | POA: Insufficient documentation

## 2018-09-17 DIAGNOSIS — I480 Paroxysmal atrial fibrillation: Secondary | ICD-10-CM

## 2018-09-17 DIAGNOSIS — Z8249 Family history of ischemic heart disease and other diseases of the circulatory system: Secondary | ICD-10-CM | POA: Diagnosis not present

## 2018-09-17 LAB — MAGNESIUM: MAGNESIUM: 2.3 mg/dL (ref 1.7–2.4)

## 2018-09-17 LAB — BASIC METABOLIC PANEL
Anion gap: 6 (ref 5–15)
BUN: 16 mg/dL (ref 8–23)
CHLORIDE: 104 mmol/L (ref 98–111)
CO2: 27 mmol/L (ref 22–32)
CREATININE: 1.06 mg/dL (ref 0.61–1.24)
Calcium: 9.6 mg/dL (ref 8.9–10.3)
GFR calc Af Amer: >60 mL/min (ref >60)
GFR calc non Af Amer: >60 mL/min (ref >60)
Glucose, Bld: 108 mg/dL — ABNORMAL HIGH (ref 70–99)
Potassium: 3.8 mmol/L (ref 3.5–5.1)
SODIUM: 137 mmol/L (ref 135–145)

## 2018-09-17 MED ORDER — POTASSIUM CHLORIDE CRYS ER 20 MEQ PO TBCR: 20.0000 meq | EXTENDED_RELEASE_TABLET | Freq: Every day | ORAL | 6 refills | Status: DC

## 2018-09-17 NOTE — Progress Notes (Signed)
Primary Care Physician: Josetta Huddle, MD Primary Cardiologist: Marlou Porch Primary Electrophysiologist: Allred (to establish during Tikosyn load)  Bradley Gilbert is a 67 y.o. male with a history of paroxysmal atrial fibrillation who presents for f/u recent tikosyn admission. He has not noted  any afib on drug.   Today, he  denies symptoms of chest pain, orthopnea, PND, lower extremity edema, presyncope, syncope, snoring, daytime somnolence, bleeding, or neurologic sequela. The patient is tolerating medications without difficulties and is otherwise without complaint today.    Atrial Fibrillation Risk Factors:  he does not have symptoms or diagnosis of sleep apnea. Prior negative sleep study.   he does not have a history of rheumatic fever.  he does not have a history of alcohol use.  he has a BMI of Body mass index is 28.64 kg/m.Marland Kitchen Filed Weights   09/17/18 0930  Weight: 90.5 kg    LA size: 36   Atrial Fibrillation Management history:  Previous antiarrhythmic drugs: none  Previous cardioversions: none  Previous ablations: none  CHADS2VASC score: 2  Anticoagulation history: Eliquis   Past Medical History:  Diagnosis Date  . Allergic rhinitis   . Coronary artery disease    Cath 08/15/11 with 30% mid LAD, o/w no CAD  . Hearing loss   . History of colon polyps    BENGIN  . Left rotator cuff tear   . OA (osteoarthritis)    KNEES AND LEFT SHOULDER  . Paroxysmal atrial fibrillation [facility])    Past Surgical History:  Procedure Laterality Date  . 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 CARPAL TUNNEL RELEASE Right 1990  . COLONOSCOPY    . KNEE ARTHROSCOPY Right YRS AGO  . LAPAROSCOPIC INGUINAL HERNIA REPAIR Right 09-05-2011  . POLYPECTOMY    . SHOULDER ARTHROSCOPY WITH SUBACROMIAL DECOMPRESSION AND  BICEP TENDON REPAIR Left 05/12/2014   Procedure: LEFT ARTHROSCOPY SHOULDER WITH EXAM UNDER ANESTHESIA,SUBACROMIAL DECOMPRESSION DISTAL CLAVICAL RESECTION LABIAL REPAIR VERSES DEBRIDEMENT;  Surgeon: Sydnee Cabal, MD;  Location: [place];  Service: Orthopedics;  Laterality: Left;  . TRANSTHORACIC ECHOCARDIOGRAM  08-19-2011   MILD LVH/  EF 23-55%/  GRADE I DIASTOLIC DYSFUNCTION    Current Outpatient Medications  Medication Sig Dispense Refill  . atorvastatin (LIPITOR) 20 MG tablet Take 1 tablet (20 mg total) by mouth daily. 90 tablet 3  . diltiazem (CARDIZEM) 30 MG tablet Take 30 mg by mouth as directed. Take one tablet by mouth every 4 hours AS NEEDED for A-fib HR > 100 as long as BP > 100.    Marland Kitchen dofetilide (TIKOSYN) 500 MCG capsule Take 1 capsule (500 mcg total) by mouth 2 (two) times daily. 28 capsule 0  . ELIQUIS 5 MG TABS tablet TAKE 1 TABLET TWICE DAILY 60 tablet 10  . magnesium oxide (MAG-OX) 400 MG tablet Take 400 mg by mouth daily.    . Multiple Vitamin (MULTIVITAMIN WITH MINERALS) TABS tablet Take 1 tablet by mouth daily.    . potassium chloride SA (K-DUR,KLOR-CON) 20 MEQ tablet Take 1 tablet (20 mEq total) by mouth daily. 30 tablet 3   No current facility-administered medications for this encounter.     Allergies  Allergen Reactions  . Shellfish Allergy Anaphylaxis    Throat, face, and Eyes swell , severe headache    Social History  Socioeconomic History  . Marital status: Married    Spouse name: Not on file  . Number of children: Not on file  . Years of education: Not on file  . Highest education level: Not on file  Occupational History  . Occupation: Diet Therapist, music: [employer]  Social Needs  . Financial resource strain: Not on file  . Food insecurity:    Worry: Not on file    Inability: Not on file  . Transportation needs:    Medical: Not on file    Non-medical: Not on file  Tobacco Use  . Smoking status: Never Smoker  .  Smokeless tobacco: Never Used  Substance and Sexual Activity  . Alcohol use: No    Alcohol/week: 0.0 standard drinks  . Drug use: No  . Sexual activity: Not on file  Lifestyle  . Physical activity:    Days per week: Not on file    Minutes per session: Not on file  . Stress: Not on file  Relationships  . Social connections:    Talks on phone: Not on file    Gets together: Not on file    Attends religious service: Not on file    Active member of club or organization: Not on file    Attends meetings of clubs or organizations: Not on file    Relationship status: Not on file  . Intimate partner violence:    Fear of current or ex partner: Not on file    Emotionally abused: Not on file    Physically abused: Not on file    Forced sexual activity: Not on file  Other Topics Concern  . Not on file  Social History Narrative   Graduate of [employer].  Married - '85.  1 son - '70,  2 daughters - '74, '87;   5 grandchildren.  Hobbies - keeps horses and ponies.  Marriage - good health             Family History  Problem Relation Age of Onset  . Diabetes Mother   . Heart disease Father   . Cancer Father        throat  . COPD Father        throat  . Colon cancer Neg Hx   . Prostate cancer Neg Hx   . Rectal cancer Neg Hx   . Stomach cancer Neg Hx     ROS- All systems are reviewed and negative except as per the HPI above.  Physical Exam: Vitals:   09/17/18 0930  BP: (!) 162/98  Pulse: (!) 59  Weight: 90.5 kg  Height: 5\' 10"  (1.778 m)    GEN- The patient is well appearing, alert and oriented x 3 today.   Head- normocephalic, atraumatic Eyes-  Sclera clear, conjunctiva pink Ears- hearing intact Oropharynx- clear Neck- supple  Lungs- Clear to ausculation bilaterally, normal work of breathing Heart- Bradycardic regular rate and rhythm  GI- soft, NT, ND, + BS Extremities- no clubbing, cyanosis, or edema MS- no significant deformity or atrophy Skin- no rash or  lesion Psych- euthymic mood, full affect Neuro- strength and sensation are intact  Wt Readings from Last 3 Encounters:  09/17/18 90.5 kg  09/10/18 90 kg  09/07/18 91.2 kg    EKG today demonstrates sinus bradycardia, rate 59, NSIVD, PR 186 msec, QRS 138 msec, QTc 465 msec  Epic records are reviewed at length today  Assessment and Plan:  1. Paroxysmal atrial fibrillation In SR today  and has not noted any afib with recent  tikosyn admit General precautions for drug discussed Receiving  pt assistance Continue Eliquis for CHADS2VASC of 2, no missed doses No benadryl use Bmet/mag today   2. HTN Elevated today but pt states that he is anxious today for outcome of ekg, in range at home No change required today  F/u with Dr. Rayann Heman in one month  Roderic Palau, NP 09/17/2018 10:31 AM

## 2018-09-20 ENCOUNTER — Other Ambulatory Visit (HOSPITAL_COMMUNITY): Payer: Self-pay | Admitting: *Deleted

## 2018-09-20 ENCOUNTER — Other Ambulatory Visit: Payer: Medicare Other

## 2018-09-20 MED ORDER — POTASSIUM CHLORIDE CRYS ER 20 MEQ PO TBCR: 20.0000 meq | EXTENDED_RELEASE_TABLET | Freq: Two times a day (BID) | ORAL | 6 refills | Status: DC

## 2018-09-21 ENCOUNTER — Telehealth (HOSPITAL_COMMUNITY): Payer: Self-pay | Admitting: *Deleted

## 2018-09-21 NOTE — Telephone Encounter (Signed)
Patient approved for tikosyn assistance through 11/23/18 Patient ID #[id number]

## 2018-09-27 ENCOUNTER — Encounter: Payer: Self-pay | Admitting: Internal Medicine

## 2018-10-11 ENCOUNTER — Other Ambulatory Visit (HOSPITAL_COMMUNITY): Payer: Self-pay | Admitting: *Deleted

## 2018-10-11 MED ORDER — DOFETILIDE 500 MCG PO CAPS: 500.0000 ug | ORAL_CAPSULE | Freq: Two times a day (BID) | ORAL | 2 refills | Status: DC

## 2018-10-18 ENCOUNTER — Ambulatory Visit: Payer: Medicare Other | Admitting: Internal Medicine

## 2018-10-18 ENCOUNTER — Encounter: Payer: Self-pay | Admitting: Internal Medicine

## 2018-10-18 VITALS — BP 132/88 | HR 60 | Ht 70.0 in | Wt 198.4 lb

## 2018-10-18 DIAGNOSIS — I10 Essential (primary) hypertension: Secondary | ICD-10-CM

## 2018-10-18 DIAGNOSIS — I480 Paroxysmal atrial fibrillation: Secondary | ICD-10-CM | POA: Diagnosis not present

## 2018-10-18 NOTE — Progress Notes (Signed)
Electrophysiology Office Note Date: 10/18/2018  ID:  Bradley, Gilbert [birth year], MRN [id number]  PCP: Josetta Huddle, MD Primary Cardiologist: Dr Marlou Porch Electrophysiologist: Dr Rayann Heman  CC: Follow up for atrial fibrillation  Bradley Gilbert is a 67 y.o. male seen today for routine electrophysiology followup.  Since last being seen in our clinic, the patient reports doing very well. He has not had any palpitations or heart racing symptoms. He also feels like he has more energy since starting Tikosyn.   He denies chest pain, dyspnea, PND, orthopnea, nausea, vomiting, dizziness, syncope, edema, weight gain, or early satiety.  Past Medical History:  Diagnosis Date  . Allergic rhinitis   . Coronary artery disease    Cath 08/15/11 with 30% mid LAD, o/w no CAD  . Hearing loss   . History of colon polyps    BENGIN  . Left rotator cuff tear   . OA (osteoarthritis)    KNEES AND LEFT SHOULDER  . Paroxysmal atrial fibrillation [facility])    Past Surgical History:  Procedure Laterality Date  . 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 CARPAL TUNNEL RELEASE Right 1990  . COLONOSCOPY    . KNEE ARTHROSCOPY Right YRS AGO  . LAPAROSCOPIC INGUINAL HERNIA REPAIR Right 09-05-2011  . POLYPECTOMY    . SHOULDER ARTHROSCOPY WITH SUBACROMIAL DECOMPRESSION AND BICEP TENDON REPAIR Left 05/12/2014   Procedure: LEFT ARTHROSCOPY SHOULDER WITH EXAM UNDER ANESTHESIA,SUBACROMIAL DECOMPRESSION DISTAL CLAVICAL RESECTION LABIAL REPAIR VERSES DEBRIDEMENT;  Surgeon: Sydnee Cabal, MD;  Location: [place];  Service: Orthopedics;  Laterality: Left;  . TRANSTHORACIC ECHOCARDIOGRAM  08-19-2011   MILD LVH/  EF 14-78%/  GRADE I DIASTOLIC DYSFUNCTION    Current Outpatient Medications  Medication Sig Dispense Refill  .  atorvastatin (LIPITOR) 20 MG tablet Take 1 tablet (20 mg total) by mouth daily. 90 tablet 3  . diltiazem (CARDIZEM) 30 MG tablet Take 30 mg by mouth as directed. Take one tablet by mouth every 4 hours AS NEEDED for A-fib HR > 100 as long as BP > 100.    Marland Kitchen dofetilide (TIKOSYN) 500 MCG capsule Take 1 capsule (500 mcg total) by mouth 2 (two) times daily. 180 capsule 2  . ELIQUIS 5 MG TABS tablet TAKE 1 TABLET TWICE DAILY 60 tablet 10  . magnesium oxide (MAG-OX) 400 MG tablet Take 400 mg by mouth daily.    . Multiple Vitamin (MULTIVITAMIN WITH MINERALS) TABS tablet Take 1 tablet by mouth daily.    . potassium chloride SA (K-DUR,KLOR-CON) 20 MEQ tablet Take 1 tablet (20 mEq total) by mouth 2 (two) times daily. 60 tablet 6   No current facility-administered medications for this visit.     Allergies:   Shellfish allergy   Social History: Social History   Socioeconomic History  . Marital status: Married    Spouse name: Not on file  . Number of children: Not on file  . Years of education: Not on file  . Highest education level: Not on file  Occupational History  . Occupation: Diet Therapist, music: [employer]  Social Needs  . Financial resource strain: Not on file  . Food insecurity:    Worry: Not on file    Inability: Not on file  . Transportation needs:    Medical:  Not on file    Non-medical: Not on file  Tobacco Use  . Smoking status: Never Smoker  . Smokeless tobacco: Never Used  Substance and Sexual Activity  . Alcohol use: No    Alcohol/week: 0.0 standard drinks  . Drug use: No  . Sexual activity: Not on file  Lifestyle  . Physical activity:    Days per week: Not on file    Minutes per session: Not on file  . Stress: Not on file  Relationships  . Social connections:    Talks on phone: Not on file    Gets together: Not on file    Attends religious service: Not on file    Active member of club or organization: Not on file    Attends meetings of clubs or  organizations: Not on file    Relationship status: Not on file  . Intimate partner violence:    Fear of current or ex partner: Not on file    Emotionally abused: Not on file    Physically abused: Not on file    Forced sexual activity: Not on file  Other Topics Concern  . Not on file  Social History Narrative   Graduate of [employer].  Married - '85.  1 son - '70,  2 daughters - '74, '87;   5 grandchildren.  Hobbies - keeps horses and ponies.  Marriage - good health             Family History: Family History  Problem Relation Age of Onset  . Diabetes Mother   . Heart disease Father   . Cancer Father        throat  . COPD Father        throat  . Colon cancer Neg Hx   . Prostate cancer Neg Hx   . Rectal cancer Neg Hx   . Stomach cancer Neg Hx     Review of Systems: All other systems reviewed and are otherwise negative except as noted above.   Physical Exam: VS:  BP 132/88   Pulse 60   Ht 5\' 10"  (1.778 m)   Wt 198 lb 6.4 oz (90 kg)   SpO2 96%   BMI 28.47 kg/m  , BMI Body mass index is 28.47 kg/m. Wt Readings from Last 3 Encounters:  10/18/18 198 lb 6.4 oz (90 kg)  09/17/18 199 lb 9.6 oz (90.5 kg)  09/10/18 198 lb 6.4 oz (90 kg)    GEN- The patient is well appearing, alert and oriented x 3 today.   HEENT: normocephalic, atraumatic; sclera clear, conjunctiva pink; hearing intact; oropharynx clear; neck supple, no JVP Lymph- no cervical lymphadenopathy Lungs- Clear to ausculation bilaterally, normal work of breathing.  No wheezes, rales, rhonchi Heart- Regular rate and rhythm, no murmurs, rubs or gallops, Extremities- no clubbing, cyanosis, or edema MS- no significant deformity or atrophy Skin- warm and dry, no rash or lesion  Psych- euthymic mood, full affect Neuro- strength and sensation are intact   EKG:  EKG is ordered today. The ekg ordered today shows sinus rhythm HR 60, LBBB  Recent Labs: 07/04/2018: B Natriuretic Peptide 30.1; Hemoglobin 15.3;  Platelets 195 09/10/2018: ALT 71 09/17/2018: BUN 16; Creatinine, Ser 1.06; Magnesium 2.3; Potassium 3.8; Sodium 137    Other studies Reviewed: Additional studies/ records that were reviewed today include: notes from recent admission, Afib clinic notes.  Assessment and Plan:  1. Paroxsymal atrial fibrillation Appears to be maintaining sinus rhythm with Tikosyn Continue Eliquis for CHADS2VASC  score of 2 Bmet/mag today Patient states that he will be switching to genric dofetilide in the next couple of months. Patient to return to Afib clinic in 4-5 days for ECG check after staring dofetilide.  2. HTN Stable No changes today  3. Hyperlipidemia Patient requesting lipid profile with LFTs in addition to his other blood work.   Current medicines are reviewed at length with the patient today.   The patient does not have concerns regarding his medicines.    Orders Placed This Encounter  Procedures  . Hepatic function panel  . Basic Metabolic Panel (BMET)  . Magnesium  . EKG 12-Lead     Disposition:   Follow up with Afib clinic for ECG check after medication change and then office visit in the Afib clinic in 3 months.   Army Fossa MD 10/18/2018 1:10 PM   [address] [place] [address] [phone] (office) [phone] (fax)

## 2018-10-18 NOTE — Patient Instructions (Addendum)
Medication Instructions:  Your physician recommends that you continue on your current medications as directed. Please refer to the Current Medication list given to you today.  Labwork: You will get lab work today:  Liver panel, BMP and magnesium   Testing/Procedures: None ordered.  Follow-Up: Your physician wants you to follow-up in:   3 months with Adline Peals at the [facility].   You will receive a reminder letter in the mail two months in advance. If you don't receive a letter, please call our office to schedule the follow-up appointment.   Any Other Special Instructions Will Be Listed Below (If Applicable).  If you need a refill on your cardiac medications before your next appointment, please call your pharmacy.

## 2018-10-19 LAB — HEPATIC FUNCTION PANEL
ALK PHOS: 46 IU/L (ref 39–117)
ALT: 39 IU/L (ref 0–44)
AST: 30 IU/L (ref 0–40)
Albumin: 4.7 g/dL (ref 3.6–4.8)
BILIRUBIN TOTAL: 0.7 mg/dL (ref 0.0–1.2)
BILIRUBIN, DIRECT: 0.2 mg/dL (ref 0.00–0.40)
Total Protein: 7.2 g/dL (ref 6.0–8.5)

## 2018-10-19 LAB — BASIC METABOLIC PANEL
BUN / CREAT RATIO: 13 (ref 10–24)
BUN: 14 mg/dL (ref 8–27)
CO2: 22 mmol/L (ref 20–29)
Calcium: 9.7 mg/dL (ref 8.6–10.2)
Chloride: 100 mmol/L (ref 96–106)
Creatinine, Ser: 1.05 mg/dL (ref 0.76–1.27)
GFR calc non Af Amer: 72 mL/min/{1.73_m2} (ref >59)
GFR, EST AFRICAN AMERICAN: 83 mL/min/{1.73_m2} (ref >59)
GLUCOSE: 102 mg/dL — AB (ref 65–99)
POTASSIUM: 4.2 mmol/L (ref 3.5–5.2)
SODIUM: 139 mmol/L (ref 134–144)

## 2018-10-19 LAB — MAGNESIUM: Magnesium: 2.2 mg/dL (ref 1.6–2.3)

## 2018-11-02 ENCOUNTER — Telehealth: Payer: Self-pay | Admitting: Cardiology

## 2018-11-02 NOTE — Telephone Encounter (Signed)
New Message:     Pt says he has Arthiritis in his right knee. He wants to know what  Anti-Flammatory medicine can he take along with the other medicine he takes?

## 2018-11-02 NOTE — Telephone Encounter (Signed)
Will forward to Dr Marlou Porch for review and recommendations

## 2018-11-05 NOTE — Telephone Encounter (Signed)
Best to avoid long term use of NSAIDs like ADVIL or ALEVE with Eliquis use.  I would suggest Tylenol.  Candee Furbish, MD

## 2018-11-05 NOTE — Telephone Encounter (Signed)
Pt.notified

## 2018-12-01 ENCOUNTER — Telehealth (HOSPITAL_COMMUNITY): Payer: Self-pay | Admitting: *Deleted

## 2018-12-01 NOTE — Telephone Encounter (Signed)
Pt cld reporting that he has had increased HRs from 90s to 170 at home and that his current blood pressure is 177/124.  Pt reports NO dizziness, chest pain, sweating or distress.  Mild dizziness and feeling tired.  Pt has not taken his 30 mg diltiazem.  Per Roderic Palau, NP pt should take his Cardizem 30 mg q 4 hrs as long as HR over 470 and systolic bp over 962.  He should rest and monitor these numbers.  If pt develops chest pain, sweating, feels unstable or becomes more SOB he can proceed to the ED. Pt was also advised to call back in the morning with an update.  Pt understood instructions.

## 2018-12-02 MED ORDER — DILTIAZEM HCL 30 MG PO TABS: 30.0000 mg | ORAL_TABLET | ORAL | 1 refills | Status: AC

## 2018-12-02 NOTE — Addendum Note (Signed)
Addended by: Juluis Mire on: 12/02/2018 08:44 AM   Modules accepted: Orders

## 2018-12-02 NOTE — Telephone Encounter (Signed)
Patient returned to normal rhythm about 2 hours after taking diltiazem.

## 2018-12-17 ENCOUNTER — Other Ambulatory Visit (HOSPITAL_COMMUNITY): Payer: Self-pay | Admitting: *Deleted

## 2018-12-17 ENCOUNTER — Telehealth (HOSPITAL_COMMUNITY): Payer: Self-pay | Admitting: *Deleted

## 2018-12-17 NOTE — Telephone Encounter (Signed)
Pt cld reporting he is back in NSR.  He feels better, but still would like to proceed with next step of possible ablation.  Pt was advised that Dr Bonita Quin nurse and scheduler are aware of need for consult with Allred and would work to get him scheduled.  Pt had multiple questions related to bp, diltiazem and generic/vs brand tikosyn.  All questions answered and pt verbalized understanding.

## 2018-12-17 NOTE — Telephone Encounter (Signed)
Pt cld today stating he went out of rhythm at about 1:00 a.m.  With rates of 160.  Pt BP at that time was 141/84; pt proceeded to take diltiazem 30 mg.  Pt still in afib this morning with rates in the 120s.  BP 119/71.  Pt advised since low rates in rhythm, to only continue dilt 30 mg if HR is over 449 and systolic BP over 201.  Pt breaking through on Tikosyn and suggested by Roderic Palau, NP  That pt consult with Dr. Rayann Heman re ablation.  Note sent to Allred's scheduler and pt understood next steps.  He will call us this afternoon to let us know if symptoms persist

## 2018-12-20 ENCOUNTER — Telehealth: Payer: Self-pay | Admitting: Internal Medicine

## 2018-12-27 ENCOUNTER — Telehealth (HOSPITAL_COMMUNITY): Payer: Self-pay | Admitting: Physician Assistant

## 2018-12-27 ENCOUNTER — Other Ambulatory Visit: Payer: Self-pay

## 2018-12-27 DIAGNOSIS — I480 Paroxysmal atrial fibrillation: Secondary | ICD-10-CM

## 2018-12-27 MED ORDER — METOPROLOL SUCCINATE ER 25 MG PO TB24: 12.5000 mg | ORAL_TABLET | Freq: Every day | ORAL | 3 refills | Status: DC

## 2018-12-27 NOTE — Progress Notes (Signed)
Entered cardiac CT orders for pre cert.  Pt scheduled for OV on February 19.

## 2018-12-27 NOTE — Telephone Encounter (Signed)
Bradley Gilbert reports that his heart rate is elevated again 160s-170s and he does feel SOB but overall tolerating well. Encouraged him to take his 30mg  diltiazem and call back in 30-45 minutes with his HR and BP. His ablation is scheduled for 2/25.

## 2018-12-27 NOTE — Telephone Encounter (Signed)
Patient called back in BP 138/74 HR 140s. Feeling some better. Discussed with Roderic Palau Np - will restart metoprolol succinate 12.5mg  at bedtime. Pt will call back tomorrow morning with report of HRs.

## 2018-12-28 NOTE — Telephone Encounter (Signed)
Pt cld to report that after starting the daily metoprolol 12.5 mg at bedtime he did go back into normal rhythm after

## 2019-01-02 ENCOUNTER — Telehealth: Payer: Self-pay | Admitting: Cardiology

## 2019-01-02 NOTE — Telephone Encounter (Signed)
  Pt of Dr. Rayann Heman and followed in the afib clinic. Calling due to elevated HR in the 140s. He felt a bit dizzy earlier but has since resolved. No dyspnea, CP, syncope/ near syncope. Reports full med compliance. On Tikosyn and low dose metoprolol + Eliquis. He is scheduled for ablation on 2/25. BP stable in the 412I systolic. He took 12.5 mg of metoprolol this morning. He was advised to take a whole 25 mg tablet now to help with HR. Will notify Afib clinic to see if they can f/u tomorrow. Pt to call back if any additional issues/  go to ED if condition worsens and he becomes symptomatic.

## 2019-01-03 MED ORDER — METOPROLOL SUCCINATE ER 25 MG PO TB24: 25.0000 mg | ORAL_TABLET | Freq: Every day | ORAL | 3 refills | Status: DC

## 2019-01-03 MED ORDER — METOPROLOL TARTRATE 25 MG PO TABS: 12.5000 mg | ORAL_TABLET | Freq: Four times a day (QID) | ORAL | 3 refills | Status: DC | PRN

## 2019-01-03 NOTE — Telephone Encounter (Signed)
Patient episode discussed with Roderic Palau NP will increase metoprolol succinate to 25mg  a day can use metoprolol tartrate as needed for breakthrough as pt does not feel the cardizem covers him well with breakthrough. Pt verbalized understanding of instructions will call if further issues.

## 2019-01-03 NOTE — Telephone Encounter (Signed)
Spoke with the pt to check on how he's feeling, as advised by Ellen Henri PA-C yesterday.  Pt states he is feeling much better since he took the full dose of metoprolol 25 mg.  Pt states his HR came back down to the 60's, and his BP is 120/70.  Pt states that he would like for Roderic Palau NP and RN to call him back today, to discuss possible med changes, to help with his elevated HRs and BP readings.  Pt states that he feels the Diltiazem doesn't work that well for him, but taking the increased dose of Metoprolol Ellen Henri PA-C advised on,  seemed to help him out tremendously.  Pt states he is currently down at his barn working and he is asymptomatic, but fears his symptoms could return, if something isn't done.  Pt states that along with his V/S being normal this morning, his apple watch also indicated that he is back in NSR.  Informed the pt that being he is asymptomatic at this time, I will forward this call to Roderic Palau NP and Marzetta Board RN, to review and follow-up with the pt sometime today.  Pt verbalized understanding and agrees with this plan.

## 2019-01-03 NOTE — Addendum Note (Signed)
Addended by: Juluis Mire on: 01/03/2019 11:19 AM   Modules accepted: Orders

## 2019-01-11 ENCOUNTER — Telehealth (HOSPITAL_COMMUNITY): Payer: Self-pay | Admitting: Emergency Medicine

## 2019-01-11 NOTE — Telephone Encounter (Signed)
Reaching out to patient to offer assistance regarding upcoming cardiac imaging study; pt verbalizes understanding of appt date/time, parking situation and where to check in, pre-test NPO status and medications ordered, and verified current allergies; name and call back number provided for further questions should they arise Wilbert Hayashi RN Navigator Cardiac Imaging [facility] [phone] office [phone] cell 

## 2019-01-12 ENCOUNTER — Ambulatory Visit: Payer: Medicare Other | Admitting: Internal Medicine

## 2019-01-12 ENCOUNTER — Encounter: Payer: Self-pay | Admitting: Internal Medicine

## 2019-01-12 VITALS — BP 132/80 | HR 55 | Ht 70.0 in | Wt 200.0 lb

## 2019-01-12 DIAGNOSIS — I480 Paroxysmal atrial fibrillation: Secondary | ICD-10-CM | POA: Diagnosis not present

## 2019-01-12 DIAGNOSIS — I10 Essential (primary) hypertension: Secondary | ICD-10-CM | POA: Diagnosis not present

## 2019-01-12 DIAGNOSIS — Z01812 Encounter for preprocedural laboratory examination: Secondary | ICD-10-CM | POA: Diagnosis not present

## 2019-01-12 NOTE — H&P (View-Only) (Signed)
PCP: Josetta Huddle, MD Primary Cardiologist: Dr Marlou Porch Primary EP: Dr Rayann Heman  Bradley Gilbert is a 68 y.o. male who presents today for routine electrophysiology followup.  Since last being seen in our clinic, the patient reports doing reasonably well. However, he has had more heart racing over the last few weeks. He reports that 1-2 times per week, his heart rate with elevated and he becomes very fatigued and SOB. His longest episode was 12 hours. His PRN diltiazem and BB do not seem to help with his symptoms.  Today, he denies symptoms of chest pain, lower extremity edema, dizziness, presyncope, or syncope.  The patient is otherwise without complaint today.   Past Medical History:  Diagnosis Date  . Allergic rhinitis   . Coronary artery disease    Cath 08/15/11 with 30% mid LAD, o/w no CAD  . Hearing loss   . History of colon polyps    BENGIN  . Left rotator cuff tear   . OA (osteoarthritis)    KNEES AND LEFT SHOULDER  . Paroxysmal atrial fibrillation [facility])    Past Surgical History:  Procedure Laterality Date  . 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 CARPAL TUNNEL RELEASE Right 1990  . COLONOSCOPY    . KNEE ARTHROSCOPY Right YRS AGO  . LAPAROSCOPIC INGUINAL HERNIA REPAIR Right 09-05-2011  . POLYPECTOMY    . SHOULDER ARTHROSCOPY WITH SUBACROMIAL DECOMPRESSION AND BICEP TENDON REPAIR Left 05/12/2014   Procedure: LEFT ARTHROSCOPY SHOULDER WITH EXAM UNDER ANESTHESIA,SUBACROMIAL DECOMPRESSION DISTAL CLAVICAL RESECTION LABIAL REPAIR VERSES DEBRIDEMENT;  Surgeon: Sydnee Cabal, MD;  Location: [place];  Service: Orthopedics;  Laterality: Left;  . TRANSTHORACIC ECHOCARDIOGRAM  08-19-2011   MILD LVH/  EF 17-61%/  GRADE I DIASTOLIC DYSFUNCTION    ROS- all systems are reviewed and negatives except as  per HPI above  Current Outpatient Medications  Medication Sig Dispense Refill  . atorvastatin (LIPITOR) 20 MG tablet Take 1 tablet (20 mg total) by mouth daily. 90 tablet 3  . B Complex-C (B-COMPLEX WITH VITAMIN C) tablet Take 1 tablet by mouth daily.    . CHELATED MAGNESIUM PO Take 1 tablet by mouth 2 (two) times daily.    . Coenzyme Q10 300 MG CAPS Take 300 mg by mouth daily.    Marland Kitchen dofetilide (TIKOSYN) 500 MCG capsule Take 1 capsule (500 mcg total) by mouth 2 (two) times daily. 180 capsule 2  . ELIQUIS 5 MG TABS tablet TAKE 1 TABLET TWICE DAILY (Patient taking differently: Take 5 mg by mouth 2 (two) times daily. ) 60 tablet 10  . metoprolol succinate (TOPROL XL) 25 MG 24 hr tablet Take 1 tablet (25 mg total) by mouth at bedtime. (Patient taking differently: Take 25 mg by mouth See admin instructions. Take 1 tablet (25 mg) by mouth scheduled at bedtime, may take 0.5 tablet (12.5 mg) by mouth daily as needed for breakthrough afib) 30 tablet 3  . Misc Natural Products (OSTEO BI-FLEX TRIPLE STRENGTH PO) Take 1 tablet by mouth 2 (two) times daily.    . Multiple Vitamin (MULTIVITAMIN WITH MINERALS) TABS tablet Take 1 tablet by mouth daily.    . potassium chloride SA (K-DUR,KLOR-CON) 20 MEQ tablet Take 1 tablet (20 mEq total) by mouth 2 (two) times daily. 60 tablet 6  . diltiazem (CARDIZEM)  30 MG tablet Take 1 tablet (30 mg total) by mouth as directed. Take one tablet by mouth every 4 hours AS NEEDED for A-fib HR > 100 as long as BP > 100. (Patient not taking: Reported on 01/11/2019) 45 tablet 1  . metoprolol tartrate (LOPRESSOR) 25 MG tablet Take 0.5 tablets (12.5 mg total) by mouth every 6 (six) hours as needed (breakthrough afib). (Patient not taking: Reported on 01/11/2019) 180 tablet 3   No current facility-administered medications for this visit.     Physical Exam: Vitals:   01/12/19 1021  BP: 132/80  Pulse: (!) 55  Weight: 200 lb (90.7 kg)  Height: 5\' 10"  (1.778 m)    GEN- The patient is  well appearing, alert and oriented x 3 today.   Head- normocephalic, atraumatic Eyes-  Sclera clear, conjunctiva pink Ears- hearing intact Oropharynx- clear Lungs- Clear to ausculation bilaterally, normal work of breathing Heart- Regular rate and rhythm, no murmurs, rubs or gallops, PMI not laterally displaced GI- soft, NT, ND, + BS Extremities- no clubbing, cyanosis, or edema  Wt Readings from Last 3 Encounters:  01/12/19 200 lb (90.7 kg)  10/18/18 198 lb 6.4 oz (90 kg)  09/17/18 199 lb 9.6 oz (90.5 kg)    Assessment and Plan:  1. Paroxsymal atrial fibrillation Patient having more frequent episodes of afib. he has failed medical therapy with Tikosyn. Chads2vasc score is 2.  he is anticoagulated with Eliquis . Therapeutic strategies for afib including medicine and ablation were discussed in detail with the patient today. Risk, benefits, and alternatives to EP study and radiofrequency ablation for afib were also discussed in detail today. These risks include but are not limited to stroke, bleeding, vascular damage, tamponade, perforation, damage to the esophagus, lungs, and other structures, pulmonary vein stenosis, worsening renal function, and death. The patient understands these risk and wishes to proceed. Continue Tikosyn 500 mcg. Check CBC and Bmet Continue Eliquis 5 mg BID Continue Toprol 25 mg daily Continue PRN BB and CCB.  2. HTN Stable, no changes today   Follow up in Afib clinic 4 weeks post procedure and with me in 3 months.  Thompson Grayer MD, [facility] 01/12/2019 1:07 PM

## 2019-01-12 NOTE — Progress Notes (Signed)
PCP: Josetta Huddle, MD Primary Cardiologist: Dr Marlou Porch Primary EP: Dr Rayann Heman  Bradley Gilbert is a 68 y.o. male who presents today for routine electrophysiology followup.  Since last being seen in our clinic, the patient reports doing reasonably well. However, he has had more heart racing over the last few weeks. He reports that 1-2 times per week, his heart rate with elevated and he becomes very fatigued and SOB. His longest episode was 12 hours. His PRN diltiazem and BB do not seem to help with his symptoms.  Today, he denies symptoms of chest pain, lower extremity edema, dizziness, presyncope, or syncope.  The patient is otherwise without complaint today.   Past Medical History:  Diagnosis Date  . Allergic rhinitis   . Coronary artery disease    Cath 08/15/11 with 30% mid LAD, o/w no CAD  . Hearing loss   . History of colon polyps    BENGIN  . Left rotator cuff tear   . OA (osteoarthritis)    KNEES AND LEFT SHOULDER  . Paroxysmal atrial fibrillation [facility])    Past Surgical History:  Procedure Laterality Date  . 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 CARPAL TUNNEL RELEASE Right 1990  . COLONOSCOPY    . KNEE ARTHROSCOPY Right YRS AGO  . LAPAROSCOPIC INGUINAL HERNIA REPAIR Right 09-05-2011  . POLYPECTOMY    . SHOULDER ARTHROSCOPY WITH SUBACROMIAL DECOMPRESSION AND BICEP TENDON REPAIR Left 05/12/2014   Procedure: LEFT ARTHROSCOPY SHOULDER WITH EXAM UNDER ANESTHESIA,SUBACROMIAL DECOMPRESSION DISTAL CLAVICAL RESECTION LABIAL REPAIR VERSES DEBRIDEMENT;  Surgeon: Sydnee Cabal, MD;  Location: [place];  Service: Orthopedics;  Laterality: Left;  . TRANSTHORACIC ECHOCARDIOGRAM  08-19-2011   MILD LVH/  EF 89-37%/  GRADE I DIASTOLIC DYSFUNCTION    ROS- all systems are reviewed and negatives except as  per HPI above  Current Outpatient Medications  Medication Sig Dispense Refill  . atorvastatin (LIPITOR) 20 MG tablet Take 1 tablet (20 mg total) by mouth daily. 90 tablet 3  . B Complex-C (B-COMPLEX WITH VITAMIN C) tablet Take 1 tablet by mouth daily.    . CHELATED MAGNESIUM PO Take 1 tablet by mouth 2 (two) times daily.    . Coenzyme Q10 300 MG CAPS Take 300 mg by mouth daily.    Marland Kitchen dofetilide (TIKOSYN) 500 MCG capsule Take 1 capsule (500 mcg total) by mouth 2 (two) times daily. 180 capsule 2  . ELIQUIS 5 MG TABS tablet TAKE 1 TABLET TWICE DAILY (Patient taking differently: Take 5 mg by mouth 2 (two) times daily. ) 60 tablet 10  . metoprolol succinate (TOPROL XL) 25 MG 24 hr tablet Take 1 tablet (25 mg total) by mouth at bedtime. (Patient taking differently: Take 25 mg by mouth See admin instructions. Take 1 tablet (25 mg) by mouth scheduled at bedtime, may take 0.5 tablet (12.5 mg) by mouth daily as needed for breakthrough afib) 30 tablet 3  . Misc Natural Products (OSTEO BI-FLEX TRIPLE STRENGTH PO) Take 1 tablet by mouth 2 (two) times daily.    . Multiple Vitamin (MULTIVITAMIN WITH MINERALS) TABS tablet Take 1 tablet by mouth daily.    . potassium chloride SA (K-DUR,KLOR-CON) 20 MEQ tablet Take 1 tablet (20 mEq total) by mouth 2 (two) times daily. 60 tablet 6  . diltiazem (CARDIZEM)  30 MG tablet Take 1 tablet (30 mg total) by mouth as directed. Take one tablet by mouth every 4 hours AS NEEDED for A-fib HR > 100 as long as BP > 100. (Patient not taking: Reported on 01/11/2019) 45 tablet 1  . metoprolol tartrate (LOPRESSOR) 25 MG tablet Take 0.5 tablets (12.5 mg total) by mouth every 6 (six) hours as needed (breakthrough afib). (Patient not taking: Reported on 01/11/2019) 180 tablet 3   No current facility-administered medications for this visit.     Physical Exam: Vitals:   01/12/19 1021  BP: 132/80  Pulse: (!) 55  Weight: 200 lb (90.7 kg)  Height: 5\' 10"  (1.778 m)    GEN- The patient is  well appearing, alert and oriented x 3 today.   Head- normocephalic, atraumatic Eyes-  Sclera clear, conjunctiva pink Ears- hearing intact Oropharynx- clear Lungs- Clear to ausculation bilaterally, normal work of breathing Heart- Regular rate and rhythm, no murmurs, rubs or gallops, PMI not laterally displaced GI- soft, NT, ND, + BS Extremities- no clubbing, cyanosis, or edema  Wt Readings from Last 3 Encounters:  01/12/19 200 lb (90.7 kg)  10/18/18 198 lb 6.4 oz (90 kg)  09/17/18 199 lb 9.6 oz (90.5 kg)    Assessment and Plan:  1. Paroxsymal atrial fibrillation Patient having more frequent episodes of afib. he has failed medical therapy with Tikosyn. Chads2vasc score is 2.  he is anticoagulated with Eliquis . Therapeutic strategies for afib including medicine and ablation were discussed in detail with the patient today. Risk, benefits, and alternatives to EP study and radiofrequency ablation for afib were also discussed in detail today. These risks include but are not limited to stroke, bleeding, vascular damage, tamponade, perforation, damage to the esophagus, lungs, and other structures, pulmonary vein stenosis, worsening renal function, and death. The patient understands these risk and wishes to proceed. Continue Tikosyn 500 mcg. Check CBC and Bmet Continue Eliquis 5 mg BID Continue Toprol 25 mg daily Continue PRN BB and CCB.  2. HTN Stable, no changes today   Follow up in Afib clinic 4 weeks post procedure and with me in 3 months.  Thompson Grayer MD, [facility] 01/12/2019 1:07 PM

## 2019-01-12 NOTE — Patient Instructions (Addendum)
Medication Instructions:  Your physician recommends that you continue on your current medications as directed. Please refer to the Current Medication list given to you today. If you need a refill on your cardiac medications before your next appointment, please call your pharmacy.   Labwork: You will get lab work today:  BMP and CBC. If you have labs (blood work) drawn today and your tests are completely normal, you will receive your results only by: Marland Kitchen MyChart Message (if you have MyChart) OR . A paper copy in the mail If you have any lab test that is abnormal or we need to change your treatment, we will call you to review the results.  Testing/Procedures: Your physician has requested that you have cardiac CT. Cardiac computed tomography (CT) is a painless test that uses an x-ray machine to take clear, detailed pictures of your heart. For further information please visit [employer].  Please review instructions below  Your physician has recommended that you have an ablation on 2/25. Catheter ablation is a medical procedure used to treat some cardiac arrhythmias (irregular heartbeats). During catheter ablation, a long, thin, flexible tube is put into a blood vessel in your groin (upper thigh), or neck. This tube is called an ablation catheter. It is then guided to your heart through the blood vessel. Radio frequency waves destroy small areas of heart tissue where abnormal heartbeats may cause an arrhythmia to start. Please see the instruction below  Follow-Up: Keep your scheduled follow up on 02/16/2019 @ 11:00 a.m. with Audry Pili, PA in the AFib clinic.  Keep your scheduled follow up on 04/22/2019 @ 10:45 a.m. with Dr. Rayann Heman.  Thank you for choosing [facility]!!    Any Other Special Instructions Will Be Listed Below (If Applicable).   CARDIAC CT INSTRUCTIONS:  Please arrive at the [facility] main entrance of [facility] on 01/14/2019 at 9:00 AM (30-45 minutes prior to  test start time)  [facility] [place], [place] [address] [phone]  Proceed to the [facility] Radiology Department (First Floor).  Please follow these instructions carefully (unless otherwise directed):  Hold all erectile dysfunction medications at least 48 hours prior to test.  On the Night Before the Test: . Be sure to Drink plenty of water. . Do not consume any caffeinated/decaffeinated beverages or chocolate 12 hours prior to your test. . Do not take any antihistamines 12 hours prior to your test. . If you take Metformin do not take 24 hours prior to test. . If the patient has contrast allergy: ? Patient will need a prescription for Prednisone and very clear instructions (as follows): 1. Prednisone 50 mg - take 13 hours prior to test 2. Take another Prednisone 50 mg 7 hours prior to test 3. Take another Prednisone 50 mg 1 hour prior to test 4. Take Benadryl 50 mg 1 hour prior to test . Patient must complete all four doses of above prophylactic medications. . Patient will need a ride after test due to Benadryl.  On the Day of the Test: . Drink plenty of water. Do not drink any water within one hour of the test. . Do not eat any food 4 hours prior to the test. . You may take your regular medications prior to the test.  . Take your Toprol two hours prior to test. . HOLD Furosemide/Hydrochlorothiazide morning of the test.      After the Test: . Drink plenty of water. . After receiving IV contrast, you may  experience a mild flushed feeling. This is normal. . On occasion, you may experience a mild rash up to 24 hours after the test. This is not dangerous. If this occurs, you can take Benadryl 25 mg and increase your fluid intake. . If you experience trouble breathing, this can be serious. If it is severe call 911 IMMEDIATELY. If it is mild, please call our office. . If you take any of these medications: Glipizide/Metformin, Avandament, Glucavance,  please do not take 48 hours after completing test.    ABLATION INSTRUCTIONS: Please arrive at the [facility] main entrance (entrance A) of [facility] hospital at: 5:30 a.m on 01/18/2019 Do not eat or drink after midnight prior to procedure On the morning of your procedure do not take any medications. Plan for one night stay.  You will need someone to drive you home at discharge.

## 2019-01-13 LAB — CBC
HEMOGLOBIN: 15.8 g/dL (ref 13.0–17.7)
Hematocrit: 45.3 % (ref 37.5–51.0)
MCH: 31.9 pg (ref 26.6–33.0)
MCHC: 34.9 g/dL (ref 31.5–35.7)
MCV: 92 fL (ref 79–97)
Platelets: 205 10*3/uL (ref 150–450)
RBC: 4.95 x10E6/uL (ref 4.14–5.80)
RDW: 12.1 % (ref 11.6–15.4)
WBC: 7.2 10*3/uL (ref 3.4–10.8)

## 2019-01-13 LAB — BASIC METABOLIC PANEL
BUN/Creatinine Ratio: 13 (ref 10–24)
BUN: 14 mg/dL (ref 8–27)
CO2: 22 mmol/L (ref 20–29)
CREATININE: 1.05 mg/dL (ref 0.76–1.27)
Calcium: 10.1 mg/dL (ref 8.6–10.2)
Chloride: 100 mmol/L (ref 96–106)
GFR calc Af Amer: 83 mL/min/{1.73_m2} (ref >59)
GFR calc non Af Amer: 72 mL/min/{1.73_m2} (ref >59)
Glucose: 100 mg/dL — ABNORMAL HIGH (ref 65–99)
Potassium: 4.2 mmol/L (ref 3.5–5.2)
Sodium: 137 mmol/L (ref 134–144)

## 2019-01-14 ENCOUNTER — Ambulatory Visit (HOSPITAL_COMMUNITY)
Admission: RE | Admit: 2019-01-14 | Discharge: 2019-01-14 | Disposition: A | Discharge status: Home / Self Care | Payer: Medicare Other | Source: Ambulatory Visit | Attending: Internal Medicine | Admitting: Internal Medicine

## 2019-01-14 ENCOUNTER — Ambulatory Visit (HOSPITAL_COMMUNITY): Admission: RE | Admit: 2019-01-14 | Payer: Medicare Other | Source: Ambulatory Visit

## 2019-01-14 DIAGNOSIS — I480 Paroxysmal atrial fibrillation: Secondary | ICD-10-CM | POA: Diagnosis present

## 2019-01-14 MED ORDER — IOPAMIDOL (ISOVUE-370) INJECTION 76%
100.0000 mL | Freq: Once | INTRAVENOUS | Status: AC | PRN
  Administered 2019-01-14: 80 mL via INTRAVENOUS

## 2019-01-17 ENCOUNTER — Other Ambulatory Visit (HOSPITAL_COMMUNITY): Payer: Self-pay | Admitting: *Deleted

## 2019-01-17 MED ORDER — METOPROLOL SUCCINATE ER 25 MG PO TB24: 25.0000 mg | ORAL_TABLET | Freq: Every day | ORAL | 3 refills | Status: DC

## 2019-01-17 NOTE — Anesthesia Preprocedure Evaluation (Addendum)
Anesthesia Evaluation  Patient identified by MRN, date of birth, ID band Patient awake    Reviewed: Allergy & Precautions, NPO status , Patient's Chart, lab work & pertinent test results  History of Anesthesia Complications (+) PONV  Airway Mallampati: II  TM Distance: >3 FB Neck ROM: Full    Dental no notable dental hx. (+) Teeth Intact, Dental Advisory Given   Pulmonary neg pulmonary ROS,    Pulmonary exam normal breath sounds clear to auscultation       Cardiovascular Exercise Tolerance: Good hypertension, + CAD  Normal cardiovascular exam Rhythm:Regular Rate:Normal  07/29/18 Lexiscan    Nuclear stress EF: 58%.  There was no ST segment deviation noted during stress.  The study is normal.  The left ventricular ejection fraction is normal (55-65%).   1. EF 58% with normal wall motion.  2. No evidence for ischemia or infarction by perfusion images   Neuro/Psych negative neurological ROS  negative psych ROS   GI/Hepatic negative GI ROS, Neg liver ROS,   Endo/Other  negative endocrine ROS  Renal/GU negative Renal ROS     Musculoskeletal  (+) Arthritis ,   Abdominal   Peds  Hematology negative hematology ROS (+)   Anesthesia Other Findings Eliquis Diltiazem  Reproductive/Obstetrics                            Lab Results  Component Value Date   CREATININE 1.05 01/12/2019   BUN 14 01/12/2019   NA 137 01/12/2019   K 4.2 01/12/2019   CL 100 01/12/2019   CO2 22 01/12/2019    Lab Results  Component Value Date   WBC 7.2 01/12/2019   HGB 15.8 01/12/2019   HCT 45.3 01/12/2019   MCV 92 01/12/2019   PLT 205 01/12/2019    Anesthesia Physical Anesthesia Plan  ASA: II  Anesthesia Plan: General   Post-op Pain Management:    Induction: Intravenous  PONV Risk Score and Plan: 2 and Treatment may vary due to age or medical condition, Ondansetron and Dexamethasone  Airway  Management Planned: Oral ETT  Additional Equipment:   Intra-op Plan:   Post-operative Plan: Extubation in OR  Informed Consent: I have reviewed the patients History and Physical, chart, labs and discussed the procedure including the risks, benefits and alternatives for the proposed anesthesia with the patient or authorized representative who has indicated his/her understanding and acceptance.     Dental advisory given  Plan Discussed with: CRNA  Anesthesia Plan Comments: (afib ablation)       Anesthesia Quick Evaluation

## 2019-01-18 ENCOUNTER — Ambulatory Visit (HOSPITAL_COMMUNITY)
Admission: RE | Admit: 2019-01-18 | Discharge: 2019-01-19 | Disposition: A | Discharge status: Home / Self Care | Payer: Medicare Other | Source: Ambulatory Visit | Attending: Internal Medicine | Admitting: Internal Medicine

## 2019-01-18 ENCOUNTER — Ambulatory Visit (HOSPITAL_COMMUNITY): Payer: Medicare Other | Admitting: Anesthesiology

## 2019-01-18 ENCOUNTER — Other Ambulatory Visit: Payer: Self-pay

## 2019-01-18 ENCOUNTER — Encounter (HOSPITAL_COMMUNITY)
Admission: RE | Disposition: A | Discharge status: Home / Self Care | Payer: Self-pay | Source: Ambulatory Visit | Attending: Internal Medicine

## 2019-01-18 ENCOUNTER — Ambulatory Visit (HOSPITAL_COMMUNITY): Payer: Medicare Other | Admitting: Physician Assistant

## 2019-01-18 ENCOUNTER — Encounter (HOSPITAL_COMMUNITY): Payer: Self-pay | Admitting: Anesthesiology

## 2019-01-18 DIAGNOSIS — H9190 Unspecified hearing loss, unspecified ear: Secondary | ICD-10-CM | POA: Insufficient documentation

## 2019-01-18 DIAGNOSIS — I10 Essential (primary) hypertension: Secondary | ICD-10-CM | POA: Insufficient documentation

## 2019-01-18 DIAGNOSIS — Z7901 Long term (current) use of anticoagulants: Secondary | ICD-10-CM | POA: Insufficient documentation

## 2019-01-18 DIAGNOSIS — I2510 Atherosclerotic heart disease of native coronary artery without angina pectoris: Secondary | ICD-10-CM | POA: Diagnosis not present

## 2019-01-18 DIAGNOSIS — I480 Paroxysmal atrial fibrillation: Secondary | ICD-10-CM | POA: Diagnosis present

## 2019-01-18 DIAGNOSIS — Z79899 Other long term (current) drug therapy: Secondary | ICD-10-CM | POA: Insufficient documentation

## 2019-01-18 DIAGNOSIS — M1990 Unspecified osteoarthritis, unspecified site: Secondary | ICD-10-CM | POA: Diagnosis not present

## 2019-01-18 HISTORY — PX: ATRIAL FIBRILLATION ABLATION: EP1191

## 2019-01-18 LAB — POCT ACTIVATED CLOTTING TIME
Activated Clotting Time: 241 seconds
Activated Clotting Time: 279 seconds
Activated Clotting Time: 318 seconds
Activated Clotting Time: 389 seconds
Activated Clotting Time: 202 seconds
Activated Clotting Time: 186 seconds

## 2019-01-18 SURGERY — ATRIAL FIBRILLATION ABLATION
Anesthesia: General

## 2019-01-18 MED ORDER — POTASSIUM CHLORIDE CRYS ER 20 MEQ PO TBCR
20.0000 meq | EXTENDED_RELEASE_TABLET | Freq: Two times a day (BID) | ORAL | Status: DC
  Administered 2019-01-19: 20 meq via ORAL
  Filled 2019-01-18: qty 1

## 2019-01-18 MED ORDER — SUGAMMADEX SODIUM 200 MG/2ML IV SOLN
INTRAVENOUS | Status: DC | PRN
  Administered 2019-01-18: 181.4 mg via INTRAVENOUS

## 2019-01-18 MED ORDER — HEPARIN (PORCINE) IN NACL 1000-0.9 UT/500ML-% IV SOLN
INTRAVENOUS | Status: AC
  Filled 2019-01-18: qty 500

## 2019-01-18 MED ORDER — HYDROCODONE-ACETAMINOPHEN 5-325 MG PO TABS: 1.0000 | ORAL_TABLET | ORAL | Status: DC | PRN

## 2019-01-18 MED ORDER — SCOPOLAMINE 1 MG/3DAYS TD PT72
MEDICATED_PATCH | TRANSDERMAL | Status: DC | PRN
  Administered 2019-01-18: 1 via TRANSDERMAL

## 2019-01-18 MED ORDER — ONDANSETRON HCL 4 MG/2ML IJ SOLN: 4.0000 mg | Freq: Four times a day (QID) | INTRAMUSCULAR | Status: DC | PRN

## 2019-01-18 MED ORDER — BUPIVACAINE HCL (PF) 0.25 % IJ SOLN
INTRAMUSCULAR | Status: AC
  Filled 2019-01-18: qty 30

## 2019-01-18 MED ORDER — HEPARIN SODIUM (PORCINE) 1000 UNIT/ML IJ SOLN
INTRAMUSCULAR | Status: DC | PRN
  Administered 2019-01-18: 5000 [IU] via INTRAVENOUS
  Administered 2019-01-18: 4000 [IU] via INTRAVENOUS
  Administered 2019-01-18: 5000 [IU] via INTRAVENOUS

## 2019-01-18 MED ORDER — SODIUM CHLORIDE 0.9% FLUSH: 3.0000 mL | INTRAVENOUS | Status: DC | PRN

## 2019-01-18 MED ORDER — MIDAZOLAM HCL 5 MG/5ML IJ SOLN
INTRAMUSCULAR | Status: DC | PRN
  Administered 2019-01-18: 2 mg via INTRAVENOUS

## 2019-01-18 MED ORDER — FENTANYL CITRATE (PF) 250 MCG/5ML IJ SOLN
INTRAMUSCULAR | Status: DC | PRN
  Administered 2019-01-18: 100 ug via INTRAVENOUS

## 2019-01-18 MED ORDER — HEPARIN (PORCINE) IN NACL 1000-0.9 UT/500ML-% IV SOLN
INTRAVENOUS | Status: DC | PRN
  Administered 2019-01-18: 500 mL

## 2019-01-18 MED ORDER — BUPIVACAINE HCL (PF) 0.25 % IJ SOLN
INTRAMUSCULAR | Status: DC | PRN
  Administered 2019-01-18: 20 mL

## 2019-01-18 MED ORDER — ONDANSETRON HCL 4 MG/2ML IJ SOLN
INTRAMUSCULAR | Status: DC | PRN
  Administered 2019-01-18: 4 mg via INTRAVENOUS

## 2019-01-18 MED ORDER — HEPARIN SODIUM (PORCINE) 1000 UNIT/ML IJ SOLN
INTRAMUSCULAR | Status: DC | PRN
  Administered 2019-01-18: 12000 [IU] via INTRAVENOUS
  Administered 2019-01-18: 1000 [IU] via INTRAVENOUS

## 2019-01-18 MED ORDER — ISOPROTERENOL HCL 0.2 MG/ML IJ SOLN
INTRAMUSCULAR | Status: AC
  Filled 2019-01-18: qty 5

## 2019-01-18 MED ORDER — HEPARIN SODIUM (PORCINE) 1000 UNIT/ML IJ SOLN
INTRAMUSCULAR | Status: AC
  Filled 2019-01-18: qty 2

## 2019-01-18 MED ORDER — ACETAMINOPHEN 325 MG PO TABS: 650.0000 mg | ORAL_TABLET | ORAL | Status: DC | PRN

## 2019-01-18 MED ORDER — APIXABAN 5 MG PO TABS
5.0000 mg | ORAL_TABLET | Freq: Two times a day (BID) | ORAL | Status: DC
  Administered 2019-01-18 – 2019-01-19 (×3): 5 mg via ORAL
  Filled 2019-01-18 (×3): qty 1

## 2019-01-18 MED ORDER — PROPOFOL 10 MG/ML IV BOLUS
INTRAVENOUS | Status: DC | PRN
  Administered 2019-01-18: 120 mg via INTRAVENOUS

## 2019-01-18 MED ORDER — ISOPROTERENOL HCL 0.2 MG/ML IJ SOLN
INTRAVENOUS | Status: DC | PRN
  Administered 2019-01-18: 20 ug/min via INTRAVENOUS

## 2019-01-18 MED ORDER — DEXAMETHASONE SODIUM PHOSPHATE 10 MG/ML IJ SOLN
INTRAMUSCULAR | Status: DC | PRN
  Administered 2019-01-18: 10 mg via INTRAVENOUS

## 2019-01-18 MED ORDER — SODIUM CHLORIDE 0.9 % IV SOLN
INTRAVENOUS | Status: DC
  Administered 2019-01-18: 06:00:00 via INTRAVENOUS

## 2019-01-18 MED ORDER — SCOPOLAMINE 1 MG/3DAYS TD PT72
MEDICATED_PATCH | TRANSDERMAL | Status: AC
  Filled 2019-01-18: qty 1

## 2019-01-18 MED ORDER — SODIUM CHLORIDE 0.9 % IV SOLN
INTRAVENOUS | Status: DC | PRN
  Administered 2019-01-18: 25 ug/min via INTRAVENOUS

## 2019-01-18 MED ORDER — LIDOCAINE 2% (20 MG/ML) 5 ML SYRINGE
INTRAMUSCULAR | Status: DC | PRN
  Administered 2019-01-18: 100 mg via INTRAVENOUS

## 2019-01-18 MED ORDER — SODIUM CHLORIDE 0.9 % IV SOLN: 250.0000 mL | INTRAVENOUS | Status: DC | PRN

## 2019-01-18 MED ORDER — PROTAMINE SULFATE 10 MG/ML IV SOLN
INTRAVENOUS | Status: DC | PRN
  Administered 2019-01-18 (×4): 10 mg via INTRAVENOUS

## 2019-01-18 MED ORDER — DOFETILIDE 500 MCG PO CAPS
500.0000 ug | ORAL_CAPSULE | Freq: Two times a day (BID) | ORAL | Status: DC
  Administered 2019-01-18 – 2019-01-19 (×2): 500 ug via ORAL
  Filled 2019-01-18 (×2): qty 1

## 2019-01-18 MED ORDER — ROCURONIUM BROMIDE 50 MG/5ML IV SOSY
PREFILLED_SYRINGE | INTRAVENOUS | Status: DC | PRN
  Administered 2019-01-18: 50 mg via INTRAVENOUS

## 2019-01-18 MED ORDER — SODIUM CHLORIDE 0.9% FLUSH
3.0000 mL | Freq: Two times a day (BID) | INTRAVENOUS | Status: DC
  Administered 2019-01-18: 20:00:00 via INTRAVENOUS
  Administered 2019-01-19: 3 mL via INTRAVENOUS

## 2019-01-18 SURGICAL SUPPLY — 18 items
BLANKET WARM UNDERBOD FULL ACC (MISCELLANEOUS) ×1 IMPLANT
CATH MAPPNG PENTARAY F 2-6-2MM (CATHETERS) IMPLANT
CATH NAVISTAR SMARTTOUCH DF (ABLATOR) ×1 IMPLANT
CATH SOUNDSTAR 3D IMAGING (CATHETERS) ×1 IMPLANT
CATH WEBSTER BI DIR CS D-F CRV (CATHETERS) ×1 IMPLANT
COVER SWIFTLINK CONNECTOR (BAG) ×1 IMPLANT
NDL BAYLIS TRANSSEPTAL 71CM (NEEDLE) IMPLANT
NEEDLE BAYLIS TRANSSEPTAL 71CM (NEEDLE) ×2 IMPLANT
PACK EP LATEX FREE (CUSTOM PROCEDURE TRAY) ×2
PACK EP LF (CUSTOM PROCEDURE TRAY) ×1 IMPLANT
PAD PRO RADIOLUCENT 2001M-C (PAD) ×2 IMPLANT
PATCH CARTO3 (PAD) ×1 IMPLANT
PENTARAY F 2-6-2MM (CATHETERS) ×2
SHEATH AVANTI 11F 11CM (SHEATH) ×1 IMPLANT
SHEATH PINNACLE 7F 10CM (SHEATH) ×2 IMPLANT
SHEATH PINNACLE 9F 10CM (SHEATH) ×1 IMPLANT
SHEATH SWARTZ TS SL2 63CM 8.5F (SHEATH) ×1 IMPLANT
TUBING SMART ABLATE COOLFLOW (TUBING) ×1 IMPLANT

## 2019-01-18 NOTE — Progress Notes (Signed)
25fr 47fr and 79fr sheath aspirated and removed from rfv. manual pressure applied for 30 minutes. Groin level 0 no s+s of hematoma. Tegaderm dressing applied, bedrest instructions given.   Bilateral dp and pt pulses palpable.   Bedrest begins 12:35:00.

## 2019-01-18 NOTE — Anesthesia Postprocedure Evaluation (Signed)
Anesthesia Post Note  Patient: Bradley Gilbert  Procedure(s) Performed: ATRIAL FIBRILLATION ABLATION (N/A )     Patient location during evaluation: Cath Lab Anesthesia Type: General Level of consciousness: awake and alert Pain management: pain level controlled Vital Signs Assessment: post-procedure vital signs reviewed and stable Respiratory status: spontaneous breathing, nonlabored ventilation, respiratory function stable and patient connected to nasal cannula oxygen Cardiovascular status: blood pressure returned to baseline and stable Postop Assessment: no apparent nausea or vomiting Anesthetic complications: no    Last Vitals:  Vitals:   01/18/19 1055 01/18/19 1100  BP: 111/68 99/67  Pulse: 81 70  Resp: (!) 24 14  Temp:    SpO2: 95% 95%    Last Pain:  Vitals:   01/18/19 0557  PainSc: 4                  Barnet Glasgow

## 2019-01-18 NOTE — Anesthesia Procedure Notes (Signed)
Procedure Name: Intubation Date/Time: 01/18/2019 7:42 AM Performed by: Renato Shin, CRNA Pre-anesthesia Checklist: Patient identified, Emergency Drugs available, Suction available and Patient being monitored Patient Re-evaluated:Patient Re-evaluated prior to induction Oxygen Delivery Method: Circle system utilized Preoxygenation: Pre-oxygenation with 100% oxygen Induction Type: IV induction Ventilation: Mask ventilation without difficulty Laryngoscope Size: Miller and 2 Grade View: Grade I Tube type: Oral Tube size: 7.5 mm Number of attempts: 1 Airway Equipment and Method: Stylet Placement Confirmation: ETT inserted through vocal cords under direct vision,  positive ETCO2 and breath sounds checked- equal and bilateral Secured at: 22 cm Tube secured with: Tape Dental Injury: Teeth and Oropharynx as per pre-operative assessment

## 2019-01-18 NOTE — Transfer of Care (Signed)
Immediate Anesthesia Transfer of Care Note  Patient: Bradley Gilbert  Procedure(s) Performed: ATRIAL FIBRILLATION ABLATION (N/A )  Patient Location: PACU and Cath Lab  Anesthesia Type:General  Level of Consciousness: awake, drowsy and patient cooperative  Airway & Oxygen Therapy: Patient Spontanous Breathing and Patient connected to nasal cannula oxygen  Post-op Assessment: Report given to RN and Post -op Vital signs reviewed and stable  Post vital signs: Reviewed and stable  Last Vitals:  Vitals Value Taken Time  BP 120/74 01/18/2019 10:13 AM  Temp    Pulse 77 01/18/2019 10:13 AM  Resp 15 01/18/2019 10:13 AM  SpO2 95 % 01/18/2019 10:13 AM  Vitals shown include unvalidated device data.  Last Pain:  Vitals:   01/18/19 0557  PainSc: 4       Patients Stated Pain Goal: 3 (62/19/47 1252)  Complications: No apparent anesthesia complications

## 2019-01-18 NOTE — Interval H&P Note (Signed)
History and Physical Interval Note:  01/18/2019 7:20 AM  Bradley Gilbert  has presented today for surgery, with the diagnosis of afib  The various methods of treatment have been discussed with the patient and family. After consideration of risks, benefits and other options for treatment, the patient has consented to  Procedure(s): ATRIAL FIBRILLATION ABLATION (N/A) as a surgical intervention .  The patient's history has been reviewed, patient examined, no change in status, stable for surgery.  I have reviewed the patient's chart and labs.  Questions were answered to the patient's satisfaction.    Reports compliance with eliquis, without interruption.  Cardiac CT reviewed with patient and spouse.  We will proceed at this time. Thompson Grayer

## 2019-01-19 DIAGNOSIS — I480 Paroxysmal atrial fibrillation: Secondary | ICD-10-CM

## 2019-01-19 DIAGNOSIS — I10 Essential (primary) hypertension: Secondary | ICD-10-CM | POA: Diagnosis not present

## 2019-01-19 DIAGNOSIS — I2510 Atherosclerotic heart disease of native coronary artery without angina pectoris: Secondary | ICD-10-CM | POA: Diagnosis not present

## 2019-01-19 DIAGNOSIS — H9190 Unspecified hearing loss, unspecified ear: Secondary | ICD-10-CM | POA: Diagnosis not present

## 2019-01-19 LAB — BASIC METABOLIC PANEL
Anion gap: 10 (ref 5–15)
BUN: 17 mg/dL (ref 8–23)
CALCIUM: 8.7 mg/dL — AB (ref 8.9–10.3)
CO2: 22 mmol/L (ref 22–32)
CREATININE: 1.02 mg/dL (ref 0.61–1.24)
Chloride: 105 mmol/L (ref 98–111)
GFR calc Af Amer: >60 mL/min (ref >60)
GFR calc non Af Amer: >60 mL/min (ref >60)
Glucose, Bld: 135 mg/dL — ABNORMAL HIGH (ref 70–99)
Potassium: 3.6 mmol/L (ref 3.5–5.1)
Sodium: 137 mmol/L (ref 135–145)

## 2019-01-19 LAB — MAGNESIUM: Magnesium: 2.1 mg/dL (ref 1.7–2.4)

## 2019-01-19 MED ORDER — PANTOPRAZOLE SODIUM 40 MG PO TBEC: 40.0000 mg | DELAYED_RELEASE_TABLET | Freq: Every day | ORAL | 0 refills | Status: DC

## 2019-01-19 MED ORDER — POTASSIUM CHLORIDE CRYS ER 20 MEQ PO TBCR
30.0000 meq | EXTENDED_RELEASE_TABLET | Freq: Once | ORAL | Status: AC
  Administered 2019-01-19: 30 meq via ORAL
  Filled 2019-01-19: qty 1

## 2019-01-19 NOTE — Discharge Instructions (Signed)
Angiogram, Care After °This sheet gives you information about how to care for yourself after your procedure. Your health care provider may also give you more specific instructions. If you have problems or questions, contact your health care provider. °What can I expect after the procedure? °After the procedure, it is common to have bruising and tenderness at the catheter insertion area. °Follow these instructions at home: °Insertion site care °· Follow instructions from your health care provider about how to take care of your insertion site. Make sure you: °? Wash your hands with soap and water before you change your bandage (dressing). If soap and water are not available, use hand sanitizer. °? Change your dressing as told by your health care provider. °? Leave stitches (sutures), skin glue, or adhesive strips in place. These skin closures may need to stay in place for 2 weeks or longer. If adhesive strip edges start to loosen and curl up, you may trim the loose edges. Sahej Schrieber not remove adhesive strips completely unless your health care provider tells you to Hafiz Irion that. °· Jodelle Fausto not take baths, swim, or use a hot tub until your health care provider approves. °· You may shower 24-48 hours after the procedure or as told by your health care provider. °? Gently wash the site with plain soap and water. °? Pat the area dry with a clean towel. °? Hanne Kegg not rub the site. This may cause bleeding. °· Donnie Panik not apply powder or lotion to the site. Keep the site clean and dry. °· Check your insertion site every day for signs of infection. Check for: °? Redness, swelling, or pain. °? Fluid or blood. °? Warmth. °? Pus or a bad smell. °Activity °· Rest as told by your health care provider, usually for 1-2 days. °· Assata Juncaj not lift anything that is heavier than 10 lbs. (4.5 kg) or as told by your health care provider. °· Okley Magnussen not drive for 24 hours if you were given a medicine to help you relax (sedative). °· Darinda [place]ute[place] not drive or use heavy machinery while  taking prescription pain medicine. °General instructions ° °· Return to your normal activities as told by your health care provider, usually in about a week. Ask your health care provider what activities are safe for you. °· If the catheter site starts bleeding, lie flat and put pressure on the site. If the bleeding does not stop, get help right away. This is a medical emergency. °· Drink enough fluid to keep your urine clear or pale yellow. This helps flush the contrast dye from your body. °· Take over-the-counter and prescription medicines only as told by your health care provider. °· Keep all follow-up visits as told by your health care provider. This is important. °Contact a health care provider if: °· You have a fever or chills. °· You have redness, swelling, or pain around your insertion site. °· You have fluid or blood coming from your insertion site. °· The insertion site feels warm to the touch. °· You have pus or a bad smell coming from your insertion site. °· You have bruising around the insertion site. °· You notice blood collecting in the tissue around the catheter site (hematoma). The hematoma may be painful to the touch. °Get help right away if: °· You have severe pain at the catheter insertion area. °· The catheter insertion area swells very fast. °· The catheter insertion area is bleeding, and the bleeding does not stop when you hold steady pressure on the area. °·   The area near or just beyond the catheter insertion site becomes pale, cool, tingly, or numb. These symptoms may represent a serious problem that is an emergency. Ashleyanne Hemmingway not wait to see if the symptoms will go away. Get medical help right away. Call your local emergency services (911 in the [place] Kahmya Pinkham not drive yourself to the hospital. Summary  After the procedure, it is common to have bruising and tenderness at the catheter insertion area.  After the procedure, it is important to rest and drink plenty of fluids.  Jahzara Slattery not take baths,  swim, or use a hot tub until your health care provider says it is okay to Erricka Falkner so. You may shower 24-48 hours after the procedure or as told by your health care provider.  If the catheter site starts bleeding, lie flat and put pressure on the site. If the bleeding does not stop, get help right away. This is a medical emergency. This information is not intended to replace advice given to you by your health care provider. Make sure you discuss any questions you have with your health care provider. Document Released: 05/29/2005 Document Revised: 10/15/2016 Document Reviewed: 10/15/2016 Elsevier Interactive Patient Education  2019 [place] procedure care instructions No driving for 4days. No lifting over 5 lbs for 1 week. No vigorous or sexual activity for 1 week. You may return to work on 01/26/2019. Keep procedure site clean & dry. If you notice increased pain, swelling, bleeding or pus, call/return!  You may shower, but no soaking baths/hot tubs/pools for 1 week.   You have an appointment set up with the [facility].  Multiple studies have shown that being followed by a dedicated atrial fibrillation clinic in addition to the standard care you receive from your other physicians improves health. We believe that enrollment in the atrial fibrillation clinic will allow [place] to better care for you.   The phone number to the [facility] is [phone]. The clinic is staffed Monday through Friday from 8:30am to 5pm.  Parking Directions: The clinic is located in the Heart and Vascular Building connected to [facility]. 1)From [address] turn on to [address] and go to the 3rd entrance  (Heart and Vascular entrance) on the right. 2)Look to the right for Heart &Vascular Parking Garage. 3)A code for the entrance is required please call the clinic to receive this.   4)Take the elevators to the 1st floor. Registration is in the room with the glass walls at the  end of the hallway.  If you have any trouble parking or locating the clinic, please dont hesitate to call [phone].

## 2019-01-19 NOTE — Plan of Care (Signed)
  Problem: Nutrition: Goal: Adequate nutrition will be maintained Outcome: Progressing   Problem: Activity: Goal: Risk for activity intolerance will decrease Outcome: Progressing   Problem: Clinical Measurements: Goal: Cardiovascular complication will be avoided Outcome: Progressing

## 2019-01-19 NOTE — Discharge Summary (Addendum)
ELECTROPHYSIOLOGY PROCEDURE DISCHARGE SUMMARY    Patient ID: Bradley Gilbert,  MRN: [id number], DOB/AGE: 70-Jun-1952 70 y.o.  Admit date: 01/18/2019 Discharge date: 01/19/2019  Primary Care Physician: Josetta Huddle, MD  Primary Cardiologist: Dr. Marlou Porch Electrophysiologist: Thompson Grayer, MD  Primary Discharge Diagnosis:  1. Paroxysmal AFib     CHA2DS2Vasc is 2, on Eliquis, appropriately dosed  Secondary Discharge Diagnosis:  1. HTN  Procedures This Admission:  1.  Electrophysiology study and radiofrequency catheter ablation on 01/18/2019 by Dr Thompson Grayer.  This study demonstrated   CONCLUSIONS: 1. Sinus rhythm upon presentation.   2. Intracardiac echo reveals a moderate sized left atrium with four separate pulmonary veins without evidence of pulmonary vein stenosis. 3. Successful electrical isolation and anatomical encircling of all four pulmonary veins with radiofrequency current. 4. No inducible arrhythmias following ablation both on and off of Isuprel 5. No early apparent complications.    Brief HPI: Bradley Gilbert is a 68 y.o. male with a history of paroxysmal atrial fibrillation.  He has failed medical therapy with dofetilide. Risks, benefits, and alternatives to catheter ablation of atrial fibrillation were reviewed with the patient who wished to proceed.  The patient underwent cardiac CT prior to the procedure which demonstrated no LAA thrombus.    Hospital Course:  The patient was admitted and underwent EPS/RFCA of atrial fibrillation with details as outlined above.  He was monitored on telemetry overnight which demonstrated SR.  R groin was without complication on the day of discharge.  The patient feels well, no CP or SOB, or procedure site discomfort.  He was examined by Dr. Rayann Heman and considered to be stable for discharge.  Wound care and restrictions were reviewed with the patient.  The patient will be seen back at the AFib clinic in 4 weeks and Dr Rayann Heman in 12  weeks for post ablation follow up.    Physical Exam: Vitals:   01/18/19 1635 01/18/19 2108 01/19/19 0414 01/19/19 0415  BP: 108/70 109/60 113/66   Pulse:  64 (!) 56   Resp:      Temp:  98.4 F (36.9 C) 97.8 F (36.6 C)   TempSrc:  Oral Oral   SpO2:  96% 96%   Weight:    90.7 kg  Height:    6' (1.829 m)    GEN- The patient is well appearing, alert and oriented x 3 today.   HEENT: normocephalic, atraumatic; sclera clear, conjunctiva pink; hearing intact; oropharynx clear; neck supple  Lungs- CTA b/l, normal work of breathing.  No wheezes, rales, rhonchi Heart- Regular rate and rhythm, no murmurs, rubs or gallops  GI- soft, non-tender, non-distended  Extremities- no clubbing, cyanosis, or edema; DP/PT 2+ bilaterally, R groin without hematoma/bruit MS- no significant deformity or atrophy Skin- warm and dry, no rash or lesion Psych- euthymic mood, full affect Neuro- strength and sensation are intact   Labs:   Lab Results  Component Value Date   WBC 7.2 01/12/2019   HGB 15.8 01/12/2019   HCT 45.3 01/12/2019   MCV 92 01/12/2019   PLT 205 01/12/2019    Recent Labs  Lab 01/19/19 0403  NA 137  K 3.6  CL 105  CO2 22  BUN 17  CREATININE 1.02  CALCIUM 8.7*  GLUCOSE 135*     Discharge Medications:  Allergies as of 01/19/2019      Reactions   Shellfish Allergy Anaphylaxis   Throat, face, and Eyes swell , severe headache      Medication  List    TAKE these medications   atorvastatin 20 MG tablet Commonly known as:  LIPITOR Take 1 tablet (20 mg total) by mouth daily.   B-complex with vitamin C tablet Take 1 tablet by mouth daily.   CHELATED MAGNESIUM PO Take 1 tablet by mouth 2 (two) times daily.   Coenzyme Q10 300 MG Caps Take 300 mg by mouth daily.   diltiazem 30 MG tablet Commonly known as:  CARDIZEM Take 1 tablet (30 mg total) by mouth as directed. Take one tablet by mouth every 4 hours AS NEEDED for A-fib HR > 100 as long as BP > 100.   dofetilide  500 MCG capsule Commonly known as:  TIKOSYN Take 1 capsule (500 mcg total) by mouth 2 (two) times daily.   ELIQUIS 5 MG Tabs tablet Generic drug:  apixaban TAKE 1 TABLET TWICE DAILY What changed:  how much to take   metoprolol succinate 25 MG 24 hr tablet Commonly known as:  TOPROL XL Take 1 tablet (25 mg total) by mouth at bedtime.   metoprolol tartrate 25 MG tablet Commonly known as:  LOPRESSOR Take 0.5 tablets (12.5 mg total) by mouth every 6 (six) hours as needed (breakthrough afib).   multivitamin with minerals Tabs tablet Take 1 tablet by mouth daily.   OSTEO BI-FLEX TRIPLE STRENGTH PO Take 1 tablet by mouth 2 (two) times daily.   pantoprazole 40 MG tablet Commonly known as:  PROTONIX Take 1 tablet (40 mg total) by mouth daily.   potassium chloride SA 20 MEQ tablet Commonly known as:  K-DUR,KLOR-CON Take 1 tablet (20 mEq total) by mouth 2 (two) times daily.       Disposition: Home  Discharge Instructions    Diet - low sodium heart healthy   Complete by:  As directed    Increase activity slowly   Complete by:  As directed      Follow-up Information    [facility].   Specialty:  Cardiology Why:  02/16/2019 @ 11:00AM Contact information: [address] [id number] [place] [address] [phone]       Thompson Grayer, MD Follow up.   Specialty:  Cardiology Why:  04/22/2019 @ 10:45AM Contact information: [place] [address] [phone]           Duration of Discharge Encounter: Greater than 30 minutes including physician time.  SignedTommye Standard, PA-C 01/19/2019 9:33 AM   I have seen, examined the patient, and reviewed the above assessment and plan.  Changes to above are made where necessary.  On exam, RRR.  Doing well s/p ablation. DC to home with routine wound care and follow-up.  Co Sign: Thompson Grayer, MD 01/19/2019 11:44 AM

## 2019-01-19 NOTE — Progress Notes (Signed)
Pt stable, DC home with daughter via wheelchair

## 2019-01-21 ENCOUNTER — Encounter (HOSPITAL_COMMUNITY): Payer: Self-pay | Admitting: Internal Medicine

## 2019-01-25 ENCOUNTER — Encounter: Payer: Self-pay | Admitting: Cardiology

## 2019-01-25 ENCOUNTER — Ambulatory Visit: Payer: Medicare Other | Admitting: Cardiology

## 2019-01-25 VITALS — BP 120/78 | HR 62 | Ht 72.0 in | Wt 198.1 lb

## 2019-01-25 DIAGNOSIS — I480 Paroxysmal atrial fibrillation: Secondary | ICD-10-CM

## 2019-01-25 NOTE — Progress Notes (Signed)
Cardiology Office Note:    Date:  01/25/2019   ID:  Bradley Gilbert, DOB [birth year], MRN [id number]  PCP:  Josetta Huddle, MD  Cardiologist:  Candee Furbish, MD   Referring MD: Josetta Huddle, MD     History of Present Illness:    Bradley Gilbert "Bradley Gilbert" is a 68 y.o. male here for evaluation of CAD/atrial fibrillation at the request of Dr. Inda Merlin.  Last seen in our atrial fibrillation clinic on 07/08/2018 after 2 separate admissions in June 2018 and in August 2019 with atrial fibrillation.  Had mild CAD by cardiac catheterization in 2012.  In June 2018 when he was found to be in atrial relation he was diaphoretic, vomiting.  Mild flat troponin elevation demand ischemia.  Vertigo, ataxia.  IV hydration helped out quite a bit.  When he was seen in emergency room follow-up in the atrial fibrillation clinic he was in sinus rhythm.  Sleep study was negative.  He went for approximately 14 months without atrial fibrillation and then after a hot day, felt dizzy and was found to have heart rates 1 60-1 80.  Blood pressure was soft.  He converted after first dose of metoprolol.  Eliquis was started because of chads vas score of 2 for hypertension and age.  He is back in sinus rhythm.  Has a left bundle branch block underlying.  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  Otherwise doing quite well.  No fevers chills nausea vomiting syncope bleeding orthopnea PND.  Friend of [name], my friend. Cowboy.   01/25/2019- here for the follow-up of atrial fibrillation ablation.  Has been doing quite well without any further evidence of atrial fibrillation over the past week.  He did notice a subtle "bump "at the site, venous puncture site, explained to him that this is fairly normal.  Obviously if this enlarges or became more of an issue he would let us know.  He still feels tired.  Still having trouble sleeping.  The pharmacist told him to stay away from trazodone  due to potential complications  interactions with trazodone.  Denies any fevers chills nausea vomiting syncope.      Past Medical History:  Diagnosis Date  . Allergic rhinitis   . Coronary artery disease    Cath 08/15/11 with 30% mid LAD, o/w no CAD  . Hearing loss   . History of colon polyps    BENGIN  . Left rotator cuff tear   . OA (osteoarthritis)    KNEES AND LEFT SHOULDER  . Paroxysmal atrial fibrillation [facility])     Past Surgical History:  Procedure Laterality Date  . ATRIAL FIBRILLATION ABLATION N/A 01/18/2019   Procedure: ATRIAL FIBRILLATION ABLATION;  Surgeon: Thompson Grayer, MD;  Location: [facility];  Service: Cardiovascular;  Laterality: N/A;  . 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 CARPAL TUNNEL RELEASE Right 1990  . COLONOSCOPY    . KNEE ARTHROSCOPY Right YRS AGO  . LAPAROSCOPIC INGUINAL HERNIA REPAIR Right 09-05-2011  . POLYPECTOMY    . SHOULDER ARTHROSCOPY WITH SUBACROMIAL DECOMPRESSION AND BICEP TENDON REPAIR Left 05/12/2014   Procedure: LEFT ARTHROSCOPY SHOULDER WITH EXAM UNDER ANESTHESIA,SUBACROMIAL DECOMPRESSION DISTAL CLAVICAL RESECTION LABIAL REPAIR VERSES DEBRIDEMENT;  Surgeon: Sydnee Cabal, MD;  Location: [facility];  Service: Orthopedics;  Laterality: Left;  . TRANSTHORACIC ECHOCARDIOGRAM  08-19-2011   MILD LVH/  EF 03-47%/  GRADE I DIASTOLIC DYSFUNCTION    Current Medications: Current Meds  Medication Sig  . atorvastatin (LIPITOR) 20 MG tablet Take 1 tablet (20 mg total) by mouth daily.  . B Complex-C (B-COMPLEX WITH VITAMIN C) tablet Take 1 tablet by mouth daily.  . CHELATED MAGNESIUM PO Take 1 tablet by mouth 2 (two) times daily.  . Coenzyme Q10 300 MG CAPS Take 300 mg by mouth daily.  Marland Kitchen diltiazem (CARDIZEM) 30 MG tablet Take 1 tablet (30 mg total) by  mouth as directed. Take one tablet by mouth every 4 hours AS NEEDED for A-fib HR > 100 as long as BP > 100.  Marland Kitchen dofetilide (TIKOSYN) 500 MCG capsule Take 1 capsule (500 mcg total) by mouth 2 (two) times daily.  Marland Kitchen ELIQUIS 5 MG TABS tablet TAKE 1 TABLET TWICE DAILY  . metoprolol succinate (TOPROL XL) 25 MG 24 hr tablet Take 1 tablet (25 mg total) by mouth at bedtime.  . metoprolol tartrate (LOPRESSOR) 25 MG tablet Take 0.5 tablets (12.5 mg total) by mouth every 6 (six) hours as needed (breakthrough afib).  . Misc Natural Products (OSTEO BI-FLEX TRIPLE STRENGTH PO) Take 1 tablet by mouth 2 (two) times daily.  . Multiple Vitamin (MULTIVITAMIN WITH MINERALS) TABS tablet Take 1 tablet by mouth daily.  . pantoprazole (PROTONIX) 40 MG tablet Take 1 tablet (40 mg total) by mouth daily.  . potassium chloride SA (K-DUR,KLOR-CON) 20 MEQ tablet Take 1 tablet (20 mEq total) by mouth 2 (two) times daily.  . traZODone (DESYREL) 50 MG tablet Take 50 mg by mouth daily. Pt is currently not taking this medication but has a bottle at home.     Allergies:   Shellfish allergy   Social History   Socioeconomic History  . Marital status: Married    Spouse name: Not on file  . Number of children: Not on file  . Years of education: Not on file  . Highest education level: Not on file  Occupational History  . Occupation: Diet Therapist, music: [employer]  Social Needs  . Financial resource strain: Not on file  . Food insecurity:    Worry: Not on file    Inability: Not on file  . Transportation needs:    Medical: Not on file    Non-medical: Not on file  Tobacco Use  . Smoking status: Never Smoker  . Smokeless tobacco: Never Used  Substance and Sexual Activity  . Alcohol use: No    Alcohol/week: 0.0 standard drinks  . Drug use: No  . Sexual activity: Not on file  Lifestyle  . Physical activity:    Days per week: Not on file    Minutes per session: Not on file  . Stress: Not on file    Relationships  . Social connections:    Talks on phone: Not on file    Gets together: Not on file    Attends religious service: Not on file    Active member of club or organization: Not on file    Attends meetings of clubs or organizations: Not on file    Relationship status: Not on file  Other Topics Concern  . Not on file  Social History Narrative   Graduate of [employer].  Married - '85.  1 son - '70,  2 daughters - '74, '87;   5 grandchildren.  Hobbies - keeps horses and ponies.  Marriage - good health              Family History: The patient's family history includes COPD in his father; Cancer in his father; Diabetes in his mother; Heart disease in his father. There is no history of Colon cancer, Prostate cancer, Rectal cancer, or Stomach cancer.  ROS:   Please see the history of present illness.     All other systems reviewed and are negative.  EKGs/Labs/Other Studies Reviewed:    The following studies were reviewed today:  Echo 04/2017: - Left ventricle: The cavity size was normal. Wall thickness was   normal. Systolic function was normal. The estimated ejection   fraction was in the range of 55% to 60%. Wall motion was normal;   there were no regional wall motion abnormalities. Features are   consistent with a pseudonormal left ventricular filling pattern,   with concomitant abnormal relaxation and increased filling   pressure (grade 2 diastolic dysfunction). - Aortic valve: There was no stenosis. - Mitral valve: There was trivial regurgitation. - Left atrium: The atrium was mildly dilated. - Right ventricle: The cavity size was normal. Systolic function   was normal. - Pulmonary arteries: No complete TR doppler jet so unable to   estimate PA systolic pressure. - Inferior vena cava: The vessel was normal in size. The   respirophasic diameter changes were in the normal range (>= 50%),   consistent with normal central venous pressure.  Impressions:  -  Normal LV size with EF 55-60%. Moderate diastolic dysfunction.   Normal RV size and systolic function. No significant valvular   abnormalities.  EKG:  EKG is not ordered today.    Recent Labs: 07/04/2018: B Natriuretic Peptide 30.1 10/18/2018: ALT 39 01/12/2019: Hemoglobin 15.8; Platelets 205 01/19/2019: BUN 17; Creatinine, Ser 1.02; Magnesium 2.1; Potassium 3.6; Sodium 137  Recent Lipid Panel    Component Value Date/Time   CHOL 105 09/10/2018 0439   TRIG 81 09/10/2018 0439   HDL 37 (L) 09/10/2018 0439   CHOLHDL 2.8 09/10/2018 0439   VLDL 16 09/10/2018 0439   LDLCALC 52 09/10/2018 0439   LDLDIRECT 102.0 02/18/2016 1647    Physical Exam:    VS:  BP 120/78   Pulse 62   Ht 6' (1.829 m)   Wt 198 lb 1.9 oz (89.9 kg)   SpO2 97%   BMI 26.87 kg/m     Wt Readings from Last 3 Encounters:  01/25/19 198 lb 1.9 oz (89.9 kg)  01/19/19 200 lb (90.7 kg)  01/12/19 200 lb (90.7 kg)     GEN: Well nourished, well developed, in no acute distress  HEENT: normal  Neck: no JVD, carotid bruits, or masses Cardiac: RRR; no murmurs, rubs, or gallops,no edema  Respiratory:  clear to auscultation bilaterally, normal work of breathing GI: soft, nontender, nondistended, + BS MS: no deformity or atrophy  Skin: warm and dry, no rash Neuro:  Alert and Oriented x 3, Strength and sensation are intact Psych: euthymic mood, full affect   ASSESSMENT:    1. Paroxysmal atrial fibrillation (HCC)    PLAN:    In order of problems listed above:  Paroxysmal atrial fibrillation -Underwent catheter ablation 01/18/2019 Dr. Rayann Heman. Still on Dofetilide post ablation, which will be reevaluated at next clinic visit with Dr. Rayann Heman.  He says that he had a great week without atrial fibrillation.  Still unfortunately has fatigue, difficulty sleeping.  Agree that he should probably not  be on trazodone given the potential interactions with dofetilide.  Continue to encourage daily walking.  Obviously if groin site  changes he will let us know.  Encouraged him that it is not unusual to feel a small hematoma/scar at the site of entry.  No signs of any difficulty with circulation distally.  Mild nonobstructive CAD 2012 left bundle branch block - Continue with aggressive secondary prevention.  Given his mild CAD, would consider statin therapy.  Last LDL 84 on 04/29/2017. atorvastatin 20 mg once a day.    If he begins to have any myalgias or difficulties, he will let us know.  His wife tried statins in the past and could not tolerate them.  Left bundle branch block - Stable, no syncope.  No changes.  59-month follow-up.  Medication Adjustments/Labs and Tests Ordered: Current medicines are reviewed at length with the patient today.  Concerns regarding medicines are outlined above.  No orders of the defined types were placed in this encounter.  No orders of the defined types were placed in this encounter.   Patient Instructions  Medication Instructions:  The current medical regimen is effective;  continue present plan and medications.  If you need a refill on your cardiac medications before your next appointment, please call your pharmacy.   Follow-Up: At [facility], you and your health needs are our priority.  As part of our continuing mission to provide you with exceptional heart care, we have created designated Provider Care Teams.  These Care Teams include your primary Cardiologist (physician) and Advanced Practice Providers (APPs -  Physician Assistants and Nurse Practitioners) who all work together to provide you with the care you need, when you need it. You will need a follow up appointment in 6 months.  Please call our office 2 months in advance to schedule this appointment.  You may see Candee Furbish, MD or one of the following Advanced Practice Providers on your designated Care Team:   Truitt Merle, NP Cecilie Kicks, NP . Kathyrn Drown, NP  Thank you for choosing [facility]!!        Signed, Candee Furbish, MD  01/25/2019 11:51 AM    [place]

## 2019-01-25 NOTE — Patient Instructions (Signed)
Medication Instructions:  The current medical regimen is effective;  continue present plan and medications.  If you need a refill on your cardiac medications before your next appointment, please call your pharmacy.   Follow-Up: At [facility], you and your health needs are our priority.  As part of our continuing mission to provide you with exceptional heart care, we have created designated Provider Care Teams.  These Care Teams include your primary Cardiologist (physician) and Advanced Practice Providers (APPs -  Physician Assistants and Nurse Practitioners) who all work together to provide you with the care you need, when you need it. You will need a follow up appointment in 6 months.  Please call our office 2 months in advance to schedule this appointment.  You may see Mark Skains, MD or one of the following Advanced Practice Providers on your designated Care Team:   Lori Gerhardt, NP Laura Ingold, NP . Jill McDaniel, NP  Thank you for choosing [facility]!!       

## 2019-02-14 ENCOUNTER — Encounter (HOSPITAL_COMMUNITY): Payer: Self-pay

## 2019-02-14 ENCOUNTER — Other Ambulatory Visit (HOSPITAL_COMMUNITY): Payer: Self-pay | Admitting: *Deleted

## 2019-02-14 MED ORDER — POTASSIUM CHLORIDE CRYS ER 20 MEQ PO TBCR: 20.0000 meq | EXTENDED_RELEASE_TABLET | Freq: Two times a day (BID) | ORAL | 2 refills | Status: DC

## 2019-02-15 ENCOUNTER — Ambulatory Visit (HOSPITAL_COMMUNITY): Payer: Medicare Other | Admitting: Physician Assistant

## 2019-02-16 ENCOUNTER — Ambulatory Visit (HOSPITAL_COMMUNITY)
Admission: RE | Admit: 2019-02-16 | Discharge: 2019-02-16 | Disposition: A | Discharge status: Home / Self Care | Payer: Medicare Other | Source: Ambulatory Visit | Attending: Physician Assistant | Admitting: Physician Assistant

## 2019-02-16 ENCOUNTER — Other Ambulatory Visit: Payer: Self-pay

## 2019-02-16 DIAGNOSIS — I4891 Unspecified atrial fibrillation: Secondary | ICD-10-CM

## 2019-02-16 NOTE — Progress Notes (Signed)
Electrophysiology TeleHealth Note   Due to national recommendations of social distancing due to [place] 19, video telehealth visit is felt to be most appropriate for this patient at this time.  See MyChart message/consent below from today for patient consent regarding telehealth for the [facility].    Date:  02/16/2019   ID:  Bradley Gilbert, DOB 06-Aug-1951, MRN [id number]  Location: home   Provider location: [address] [place], [place] [address] Evaluation Performed:  Follow up   PCP:  Josetta Huddle, MD  Primary Cardiologist:  Marlou Porch Primary Electrophysiologist: Allred  CC: Afib S/p abaltion   History of Present Illness: Bradley Gilbert is a 68 y.o. male who presents via  video conferencing for a telehealth visit today.  The patient is referred for follow up ablation regarding  afib ablation He reports being very happy with the procedure. He has not had any afib since the procedure. No swallowing or groin issues. He is back to doing all of his activities without symptoms.He continues on Tikosyn. His HR is between 50's- 100 bpm depending on activity.    Today, he denies symptoms of palpitations, chest pain, shortness of breath, orthopnea, PND, lower extremity edema, claudication, dizziness, presyncope, syncope, bleeding, or neurologic sequela. The patient is tolerating medications without difficulties and is otherwise without complaint today.   he denies symptoms of cough, fevers, chills, or new SOB worrisome for COVID 19.     Atrial Fibrillation Risk Factors:  he does not have symptoms or diagnosis of sleep apnea. he does not have a history of rheumatic fever. he does not have a history of alcohol use. The patient does not have a history of early familial atrial fibrillation or other arrhythmias.  he has a BMI of There is no height or weight on file to calculate BMI.. There were no vitals filed for this visit.  Past Medical History:  Diagnosis Date  .  Allergic rhinitis   . Coronary artery disease    Cath 08/15/11 with 30% mid LAD, o/w no CAD  . Hearing loss   . History of colon polyps    BENGIN  . Left rotator cuff tear   . OA (osteoarthritis)    KNEES AND LEFT SHOULDER  . Paroxysmal atrial fibrillation [facility])    Past Surgical History:  Procedure Laterality Date  . ATRIAL FIBRILLATION ABLATION N/A 01/18/2019   Procedure: ATRIAL FIBRILLATION ABLATION;  Surgeon: Thompson Grayer, MD;  Location: [place] CV LAB;  Service: Cardiovascular;  Laterality: N/A;  . 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 CARPAL TUNNEL RELEASE Right 1990  . COLONOSCOPY    . KNEE ARTHROSCOPY Right YRS AGO  . LAPAROSCOPIC INGUINAL HERNIA REPAIR Right 09-05-2011  . POLYPECTOMY    . SHOULDER ARTHROSCOPY WITH SUBACROMIAL DECOMPRESSION AND BICEP TENDON REPAIR Left 05/12/2014   Procedure: LEFT ARTHROSCOPY SHOULDER WITH EXAM UNDER ANESTHESIA,SUBACROMIAL DECOMPRESSION DISTAL CLAVICAL RESECTION LABIAL REPAIR VERSES DEBRIDEMENT;  Surgeon: Sydnee Cabal, MD;  Location: [place];  Service: Orthopedics;  Laterality: Left;  . TRANSTHORACIC ECHOCARDIOGRAM  08-19-2011   MILD LVH/  EF 09-32%/  GRADE I DIASTOLIC DYSFUNCTION     Current Outpatient Medications  Medication Sig Dispense Refill  . atorvastatin (LIPITOR) 20 MG tablet Take 1 tablet (20 mg total) by mouth daily. [place]  tablet 3  . B Complex-C (B-COMPLEX WITH VITAMIN C) tablet Take 1 tablet by mouth daily.    . CHELATED MAGNESIUM PO Take 1 tablet by mouth 2 (two) times daily.    . Coenzyme Q10 300 MG CAPS Take 300 mg by mouth daily.    Marland Kitchen diltiazem (CARDIZEM) 30 MG tablet Take 1 tablet (30 mg total) by mouth as directed. Take one tablet by mouth every 4 hours AS NEEDED for A-fib HR > 100 as long as BP > 100. 45 tablet 1  . dofetilide  (TIKOSYN) 500 MCG capsule Take 1 capsule (500 mcg total) by mouth 2 (two) times daily. 180 capsule 2  . ELIQUIS 5 MG TABS tablet TAKE 1 TABLET TWICE DAILY 60 tablet 10  . metoprolol succinate (TOPROL XL) 25 MG 24 hr tablet Take 1 tablet (25 mg total) by mouth at bedtime. 90 tablet 3  . metoprolol tartrate (LOPRESSOR) 25 MG tablet Take 0.5 tablets (12.5 mg total) by mouth every 6 (six) hours as needed (breakthrough afib). 180 tablet 3  . Misc Natural Products (OSTEO BI-FLEX TRIPLE STRENGTH PO) Take 1 tablet by mouth 2 (two) times daily.    . Multiple Vitamin (MULTIVITAMIN WITH MINERALS) TABS tablet Take 1 tablet by mouth daily.    . pantoprazole (PROTONIX) 40 MG tablet Take 1 tablet (40 mg total) by mouth daily. 45 tablet 0  . potassium chloride SA (K-DUR,KLOR-CON) 20 MEQ tablet Take 1 tablet (20 mEq total) by mouth 2 (two) times daily. 180 tablet 2  . traZODone (DESYREL) 50 MG tablet Take 50 mg by mouth daily. Pt is currently not taking this medication but has a bottle at home.     No current facility-administered medications for this encounter.     Allergies:   Shellfish allergy   Social History:  The patient  reports that he has never smoked. He has never used smokeless tobacco. He reports that he does not drink alcohol or use drugs.   Family History:  The patient's  family history includes COPD in his father; Cancer in his father; Diabetes in his mother; Heart disease in his father.    ROS:  Please see the history of present illness.   All other systems are personally reviewed and negative.   Exam: Well appearing, alert and conversant, regular work of breathing,  good skin color.  Recent Labs: 07/04/2018: B Natriuretic Peptide 30.1 10/18/2018: ALT 39 01/12/2019: Hemoglobin 15.8; Platelets 205 01/19/2019: BUN 17; Creatinine, Ser 1.02; Magnesium 2.1; Potassium 3.6; Sodium 137  personally reviewed    Other studies personally reviewed: Epic records reviewed  The patient presents  wearable device technology report for my review today. On my review, the patient presents in SR.  Apple Watch/Kardia tracings from  3/25/ The tracings reveal SR    ASSESSMENT AND PLAN:  1.  Paroxysmal atrial fibrillation S/p ablation and maintaining SR  Feels very well  This patients CHA2DS2-VASc Score and unadjusted Ischemic Stroke Rate (% per year) is equal to 2.2 % stroke rate/year from a score of 2  Above score calculated as 1 point each if present [CHF, HTN, DM, Vascular=MI/PAD/Aortic Plaque, Age if 65-74, or Male] Above score calculated as 2 points each if present [Age > 75, or Stroke/TIA/TE]  Reminded not to miss any anticoagulation during the 3 month healing period  2.COVID screen The patient does not have any symptoms that suggest any further testing/ screening at this time.  Social distancing reinforced today.    Follow-up:  Per  Dr. Rayann Heman in 2 months as scheduled  Current medicines are reviewed at length with the patient today.   The patient does not have concerns regarding his medicines.  The following changes were made today:  none  Labs/ tests ordered today include: none No orders of the defined types were placed in this encounter.   Patient Risk:  after full review of this patients clinical status, I feel that they are at moderate risk at this time.   Today, I have spent  10-15 minutes with the patient with telehealth technology discussing  Afib/ procedure.    Signed, Roderic Palau, NP 02/16/2019 11:37 AM  Afib [facility] [address] [place], [place] [address] [phone]   I hereby voluntarily request, consent and authorize the [facility] and its employed or contracted physicians, physician assistants, nurse practitioners or other licensed health care professionals (the Practitioner), to provide me with telemedicine health care services (the "Services") as deemed necessary by the treating Practitioner. I  acknowledge and consent to receive the Services by the Practitioner via telemedicine. I understand that the telemedicine visit will involve communicating with the Practitioner through live audiovisual communication technology and the disclosure of certain medical information by electronic transmission. I acknowledge that I have been given the opportunity to request an in-person assessment or other available alternative prior to the telemedicine visit and am voluntarily participating in the telemedicine visit.   I understand that I have the right to withhold or withdraw my consent to the use of telemedicine in the course of my care at any time, without affecting my right to future care or treatment, and that the Practitioner or I may terminate the telemedicine visit at any time. I understand that I have the right to inspect all information obtained and/or recorded in the course of the telemedicine visit and may receive copies of available information for a reasonable fee.  I understand that some of the potential risks of receiving the Services via telemedicine include:   Delay or interruption in medical evaluation due to technological equipment failure or disruption;  Information transmitted may not be sufficient (e.g. poor resolution of images) to allow for appropriate medical decision making by the Practitioner; and/or  In rare instances, security protocols could fail, causing a breach of personal health information.   Furthermore, I acknowledge that it is my responsibility to provide information about my medical history, conditions and care that is complete and accurate to the best of my ability. I acknowledge that Practitioner's advice, recommendations, and/or decision may be based on factors not within their control, such as incomplete or inaccurate data provided by me or distortions of diagnostic images or specimens that may result from electronic transmissions. I understand that the practice of medicine  is not an exact science and that Practitioner makes no warranties or guarantees regarding treatment outcomes. I acknowledge that I will receive a copy of this consent concurrently upon execution via email to the email address I last provided but may also request a printed copy by calling the office of the [facility].  I understand that my insurance will be billed for this visit.   I have read or had this consent read to me.  I understand the contents of this consent, which adequately explains the benefits and risks of the Services being provided via telemedicine.  I have been provided ample opportunity to ask questions regarding this consent and the Services and have had my questions answered to my satisfaction.  I give my informed consent for the services to be provided through the use of telemedicine in my medical care  By participating in this telemedicine visit I agree to the above.

## 2019-04-19 ENCOUNTER — Telehealth: Payer: Self-pay

## 2019-04-21 NOTE — Telephone Encounter (Signed)
Spoke with pt regarding appt on 04/22/19. Pt was advise to check vitals prior to appt. Pt questions and concerns were address. 

## 2019-04-22 ENCOUNTER — Telehealth (INDEPENDENT_AMBULATORY_CARE_PROVIDER_SITE_OTHER): Payer: Medicare Other | Admitting: Internal Medicine

## 2019-04-22 ENCOUNTER — Encounter: Payer: Self-pay | Admitting: Internal Medicine

## 2019-04-22 VITALS — BP 129/74 | HR 67 | Ht 71.0 in | Wt 191.5 lb

## 2019-04-22 DIAGNOSIS — I480 Paroxysmal atrial fibrillation: Secondary | ICD-10-CM

## 2019-04-22 DIAGNOSIS — I10 Essential (primary) hypertension: Secondary | ICD-10-CM | POA: Diagnosis not present

## 2019-04-22 NOTE — Progress Notes (Signed)
Electrophysiology TeleHealth Note   Due to national recommendations of social distancing due to COVID 19, an audio/video telehealth visit is felt to be most appropriate for this patient at this time.  See MyChart message from today for the patient's consent to telehealth for [facility].   Date:  04/22/2019   ID:  Bradley Gilbert, DOB 02-27-1951, MRN [id number]  Location: patient's home  Provider location: [address], [place]  Evaluation Performed: Follow-up visit  PCP:  Josetta Huddle, MD  Cardiologist:  Candee Furbish, MD  Electrophysiologist:  Dr Rayann Heman  Chief Complaint:  afib  History of Present Illness:    Bradley Gilbert is a 68 y.o. male who presents via audio/video conferencing for a telehealth visit today.  Since his ablation, the patient reports doing very well.  Today, he denies symptoms of palpitations, chest pain, shortness of breath,  lower extremity edema, dizziness, presyncope, or syncope.  No further afib.  Denies procedure complications. The patient is otherwise without complaint today.  The patient denies symptoms of fevers, chills, cough, or new SOB worrisome for COVID 19.  Past Medical History:  Diagnosis Date  . Allergic rhinitis   . Coronary artery disease    Cath 08/15/11 with 30% mid LAD, o/w no CAD  . Hearing loss   . History of colon polyps    BENGIN  . Left rotator cuff tear   . OA (osteoarthritis)    KNEES AND LEFT SHOULDER  . Paroxysmal atrial fibrillation [facility])     Past Surgical History:  Procedure Laterality Date  . ATRIAL FIBRILLATION ABLATION N/A 01/18/2019   Procedure: ATRIAL FIBRILLATION ABLATION;  Surgeon: Thompson Grayer, MD;  Location: [place] CV LAB;  Service: Cardiovascular;  Laterality: N/A;  . 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 CARPAL  TUNNEL RELEASE Right 1990  . COLONOSCOPY    . KNEE ARTHROSCOPY Right YRS AGO  . LAPAROSCOPIC INGUINAL HERNIA REPAIR Right 09-05-2011  . POLYPECTOMY    . SHOULDER ARTHROSCOPY WITH SUBACROMIAL DECOMPRESSION AND BICEP TENDON REPAIR Left 05/12/2014   Procedure: LEFT ARTHROSCOPY SHOULDER WITH EXAM UNDER ANESTHESIA,SUBACROMIAL DECOMPRESSION DISTAL CLAVICAL RESECTION LABIAL REPAIR VERSES DEBRIDEMENT;  Surgeon: Sydnee Cabal, MD;  Location: [place];  Service: Orthopedics;  Laterality: Left;  . TRANSTHORACIC ECHOCARDIOGRAM  08-19-2011   MILD LVH/  EF 06-23%/  GRADE I DIASTOLIC DYSFUNCTION    Current Outpatient Medications  Medication Sig Dispense Refill  . atorvastatin (LIPITOR) 20 MG tablet Take 1 tablet (20 mg total) by mouth daily. 90 tablet 3  . B Complex-C (B-COMPLEX WITH VITAMIN C) tablet Take 1 tablet by mouth daily.    . CHELATED MAGNESIUM PO Take 1 tablet by mouth 2 (two) times daily.    . Coenzyme Q10 300 MG CAPS Take 300 mg by mouth daily.    Marland Kitchen diltiazem (CARDIZEM) 30 MG tablet Take 1 tablet (30 mg total) by mouth as directed. Take one tablet by mouth every 4 hours AS NEEDED for A-fib HR > 100 as long as BP > 100. 45 tablet 1  . dofetilide (TIKOSYN) 500 MCG capsule Take 1 capsule (500 mcg total) by mouth 2 (two) times daily. 180 capsule 2  . ELIQUIS 5 MG TABS tablet TAKE 1 TABLET TWICE DAILY 60 tablet 10  . metoprolol  succinate (TOPROL XL) 25 MG 24 hr tablet Take 1 tablet (25 mg total) by mouth at bedtime. 90 tablet 3  . Misc Natural Products (OSTEO BI-FLEX TRIPLE STRENGTH PO) Take 1 tablet by mouth 2 (two) times daily.    . potassium chloride SA (K-DUR,KLOR-CON) 20 MEQ tablet Take 1 tablet (20 mEq total) by mouth 2 (two) times daily. 180 tablet 2  . metoprolol tartrate (LOPRESSOR) 25 MG tablet Take 0.5 tablets (12.5 mg total) by mouth every 6 (six) hours as needed (breakthrough afib). 180 tablet 3   No current facility-administered medications for this visit.      Allergies:   Shellfish allergy   Social History:  The patient  reports that he has never smoked. He has never used smokeless tobacco. He reports that he does not drink alcohol or use drugs.   Family History:  The patient's  family history includes COPD in his father; Cancer in his father; Diabetes in his mother; Heart disease in his father.   ROS:  Please see the history of present illness.   All other systems are personally reviewed and negative.    Exam:    Vital Signs:  BP 129/74   Pulse 67   Ht 5\' 11"  (1.803 m)   Wt 191 lb 8 oz (86.9 kg)   BMI 26.71 kg/m   Well appearing, alert and conversant, regular work of breathing,  good skin color Eyes- anicteric, neuro- grossly intact, skin- no apparent rash or lesions or cyanosis, mouth- oral mucosa is pink   Labs/Other Tests and Data Reviewed:    Recent Labs: 07/04/2018: B Natriuretic Peptide 30.1 10/18/2018: ALT 39 01/12/2019: Hemoglobin 15.8; Platelets 205 01/19/2019: BUN 17; Creatinine, Ser 1.02; Magnesium 2.1; Potassium 3.6; Sodium 137   Wt Readings from Last 3 Encounters:  04/22/19 191 lb 8 oz (86.9 kg)  01/25/19 198 lb 1.9 oz (89.9 kg)  01/19/19 200 lb (90.7 kg)     Other studies personally reviewed: Additional studies/ records that were reviewed today include: AF clinic notes, my prior procedure note  Review of the above records today demonstrates: as above Prior radiographs: cardiac CT 01/14/19 reviewed   ASSESSMENT & PLAN:    1.  Paroxysmal atrial fibrillation Doing great post ablation Very pleased with results Reluctant to stop tikosyn but may be willing on return He has an apple watch to follow palpitations should they return chads2vasc score is 2.  Continue on eliquis  2. HTN Stable No change required today  3. COVID 19 screen The patient denies symptoms of COVID 19 at this time.  The importance of social distancing was discussed today.  Follow-up:  3 months with me  Current medicines are reviewed at  length with the patient today.   The patient does not have concerns regarding his medicines.  The following changes were made today:  none  Labs/ tests ordered today include:  No orders of the defined types were placed in this encounter.   Patient Risk:  after full review of this patients clinical status, I feel that they are at moderate risk at this time.  Today, I have spent 15 minutes with the patient with telehealth technology discussing afib .    Bradley Fossa, MD  04/22/2019 10:56 AM     [facility] [address] [place]  [place] [address] [phone] (office) [phone] (fax)

## 2019-07-17 ENCOUNTER — Other Ambulatory Visit: Payer: Self-pay | Admitting: Cardiology

## 2019-07-22 ENCOUNTER — Telehealth: Payer: Self-pay

## 2019-07-22 NOTE — Telephone Encounter (Signed)
Spoke with pt about his upcoming appt on 07/25/19. Pt stated he did not have any questions at this time.

## 2019-07-25 ENCOUNTER — Encounter: Payer: Self-pay | Admitting: Internal Medicine

## 2019-07-25 ENCOUNTER — Telehealth (INDEPENDENT_AMBULATORY_CARE_PROVIDER_SITE_OTHER): Payer: Medicare Other | Admitting: Internal Medicine

## 2019-07-25 VITALS — BP 126/72 | HR 65 | Ht 71.0 in | Wt 195.0 lb

## 2019-07-25 DIAGNOSIS — I10 Essential (primary) hypertension: Secondary | ICD-10-CM

## 2019-07-25 DIAGNOSIS — I480 Paroxysmal atrial fibrillation: Secondary | ICD-10-CM

## 2019-07-25 NOTE — Progress Notes (Signed)
Electrophysiology TeleHealth Note   Due to national recommendations of social distancing due to COVID 19, an audio/video telehealth visit is felt to be most appropriate for this patient at this time.  See MyChart message from today for the patient's consent to telehealth for [facility].    Date:  07/25/2019   ID:  Bradley Gilbert, DOB 07-19-1951, MRN [id number]  Location: patient's home  Provider location:  [place]  Evaluation Performed: Follow-up visit  PCP:  Josetta Huddle, MD   Electrophysiologist:  Dr Rayann Heman  Chief Complaint:  palpitations  History of Present Illness:    Bradley Gilbert is a 68 y.o. male who presents via telehealth conferencing today.  Since last being seen in our clinic, the patient reports doing very well.  He is pleased that he is having no afib symptoms. He did have an episode of severe abdmominal cramping about 3 weeks ago with transient vagal syncope.  Today, he denies symptoms of palpitations, chest pain, shortness of breath,  lower extremity edema,  or further syncope.  The patient is otherwise without complaint today.  The patient denies symptoms of fevers, chills, cough, or new SOB worrisome for COVID 19.  Past Medical History:  Diagnosis Date   Allergic rhinitis    Coronary artery disease    Cath 08/15/11 with 30% mid LAD, o/w no CAD   Hearing loss    History of colon polyps    BENGIN   Left rotator cuff tear    OA (osteoarthritis)    KNEES AND LEFT SHOULDER   Paroxysmal atrial fibrillation (HCC)     Past Surgical History:  Procedure Laterality Date   ATRIAL FIBRILLATION ABLATION N/A 01/18/2019   Procedure: ATRIAL FIBRILLATION ABLATION;  Surgeon: Thompson Grayer, MD;  Location: [place] [facility];  Service: Cardiovascular;  Laterality: N/A;   CARDIAC CATHETERIZATION  08-15-2011  DR BENSIMHON   FALSE POSTIVE STRESS TEST--  MINIMAL NON-BSTRUCTIVE CAD ,  mLAD 30%, LM congenitally absent (there was seprate ostia for the lad  & the lcfx)/   NORMAL LVF   CARPAL TUNNEL RELEASE Right 1990   COLONOSCOPY     KNEE ARTHROSCOPY Right YRS AGO   LAPAROSCOPIC INGUINAL HERNIA REPAIR Right 09-05-2011   POLYPECTOMY     SHOULDER ARTHROSCOPY WITH SUBACROMIAL DECOMPRESSION AND BICEP TENDON REPAIR Left 05/12/2014   Procedure: LEFT ARTHROSCOPY SHOULDER WITH EXAM UNDER ANESTHESIA,SUBACROMIAL DECOMPRESSION DISTAL CLAVICAL RESECTION LABIAL REPAIR VERSES DEBRIDEMENT;  Surgeon: Sydnee Cabal, MD;  Location: [place];  Service: Orthopedics;  Laterality: Left;   TRANSTHORACIC ECHOCARDIOGRAM  08-19-2011   MILD LVH/  EF [id number]  GRADE I DIASTOLIC DYSFUNCTION    Current Outpatient Medications  Medication Sig Dispense Refill   atorvastatin (LIPITOR) 20 MG tablet TAKE 1 TABLET BY MOUTH  DAILY 90 tablet 1   B Complex-C (B-COMPLEX WITH VITAMIN C) tablet Take 1 tablet by mouth daily.     CHELATED MAGNESIUM PO Take 1 tablet by mouth 2 (two) times daily.     Coenzyme Q10 300 MG CAPS Take 300 mg by mouth daily.     diltiazem (CARDIZEM) 30 MG tablet Take 1 tablet (30 mg total) by mouth as directed. Take one tablet by mouth every 4 hours AS NEEDED for A-fib HR > 100 as long as BP > 100. 45 tablet 1   dofetilide (TIKOSYN) 500 MCG capsule Take 1 capsule (500 mcg total) by mouth 2 (two) times daily. 180 capsule 2   ELIQUIS 5 MG TABS  tablet TAKE 1 TABLET TWICE DAILY 60 tablet 10   metoprolol succinate (TOPROL XL) 25 MG 24 hr tablet Take 1 tablet (25 mg total) by mouth at bedtime. 90 tablet 3   Multiple Vitamin (MULTIVITAMIN) tablet Take 1 tablet by mouth daily.     potassium chloride SA (K-DUR,KLOR-CON) 20 MEQ tablet Take 1 tablet (20 mEq total) by mouth 2 (two) times daily. 180 tablet 2   metoprolol tartrate (LOPRESSOR) 25 MG tablet Take 0.5 tablets (12.5 mg total) by mouth every 6 (six) hours as needed (breakthrough afib). 180 tablet 3   No current facility-administered medications for this visit.     Allergies:    Shellfish allergy   Social History:  The patient  reports that he has never smoked. He has never used smokeless tobacco. He reports that he does not drink alcohol or use drugs.   Family History:  The patient's family history includes COPD in his father; Cancer in his father; Diabetes in his mother; Heart disease in his father.   ROS:  Please see the history of present illness.   All other systems are personally reviewed and negative.    Exam:    Vital Signs:  BP 126/72    Pulse 65    Ht 5\' 11"  (1.803 m)    Wt 195 lb (88.5 kg)    BMI 27.20 kg/m   Well sounding and appearing, alert and conversant, regular work of breathing,  good skin color Eyes- anicteric, neuro- grossly intact, skin- no apparent rash or lesions or cyanosis, mouth- oral mucosa is pink  Labs/Other Tests and Data Reviewed:    Recent Labs: 10/18/2018: ALT 39 01/12/2019: Hemoglobin 15.8; Platelets 205 01/19/2019: BUN 17; Creatinine, Ser 1.02; Magnesium 2.1; Potassium 3.6; Sodium 137   Wt Readings from Last 3 Encounters:  07/25/19 195 lb (88.5 kg)  04/22/19 191 lb 8 oz (86.9 kg)  01/25/19 198 lb 1.9 oz (89.9 kg)     ASSESSMENT & PLAN:    1.  Paroxysmal atrial fibrillation Doing very well post ablation (2/20) without recurrence He is going to stop tikosyn after he runs out of his currently supply On eliquis for chads2vasc score of 2  2. HTN Stable No change required today   Follow-up:  4 months in AF clinic   Patient Risk:  after full review of this patients clinical status, I feel that they are at moderate risk at this time.  Today, I have spent 15 minutes with the patient with telehealth technology discussing arrhythmia management .    Army Fossa, MD  07/25/2019 10:36 AM     [facility] [address] [place] [address] [phone] (office) [phone] (fax)

## 2019-07-27 ENCOUNTER — Telehealth (HOSPITAL_COMMUNITY): Payer: Self-pay | Admitting: *Deleted

## 2019-07-27 NOTE — Telephone Encounter (Signed)
Patient was diagnosed with prostatitis by PCP - needs antibiotics - going to go ahead and stop tikosyn. Reminded patient would not be able to resume as an outpatient should he return to afib. Pt verbalized understanding.

## 2019-08-22 ENCOUNTER — Other Ambulatory Visit (HOSPITAL_COMMUNITY): Payer: Self-pay | Admitting: Nurse Practitioner

## 2019-09-04 ENCOUNTER — Other Ambulatory Visit (HOSPITAL_COMMUNITY): Payer: Self-pay | Admitting: Nurse Practitioner

## 2019-09-06 ENCOUNTER — Other Ambulatory Visit: Payer: Self-pay | Admitting: Urology

## 2019-09-06 DIAGNOSIS — R972 Elevated prostate specific antigen [PSA]: Secondary | ICD-10-CM

## 2019-09-21 ENCOUNTER — Other Ambulatory Visit: Payer: Self-pay

## 2019-09-21 ENCOUNTER — Ambulatory Visit
Admission: RE | Admit: 2019-09-21 | Discharge: 2019-09-21 | Disposition: A | Discharge status: Home / Self Care | Payer: Medicare Other | Source: Ambulatory Visit | Attending: Urology | Admitting: Urology

## 2019-09-21 DIAGNOSIS — R972 Elevated prostate specific antigen [PSA]: Secondary | ICD-10-CM

## 2019-09-21 LAB — POCT I-STAT CREATININE: Creatinine, Ser: 1 mg/dL (ref 0.61–1.24)

## 2019-09-21 MED ORDER — GADOBUTROL 1 MMOL/ML IV SOLN
8.0000 mL | Freq: Once | INTRAVENOUS | Status: AC | PRN
  Administered 2019-09-21: 8 mL via INTRAVENOUS

## 2019-09-23 ENCOUNTER — Telehealth (HOSPITAL_COMMUNITY): Payer: Self-pay | Admitting: *Deleted

## 2019-09-23 NOTE — Telephone Encounter (Signed)
Patient to have prostate biopsy needs recommendation for Eliquis. The surgeon is recommending 5-7 days pt is checking if this is appropriate. Will forward to pharm-d for recommendation.

## 2019-09-23 NOTE — Telephone Encounter (Signed)
Patient notified of recommendations. 

## 2019-09-23 NOTE — Telephone Encounter (Signed)
Patient with diagnosis of afib on Eliquis for anticoagulation.    Procedure: prostate biopsy Date of procedure: TBD  CHADS2-VASc score of  3 (HTN, AGE,  CAD)  CrCl 85 ml/min  Per office protocol, patient can hold Eliquis for 3 days prior to procedure.    Patient is at lower risk off anticoagulation, however I feel as though 5-7 days is more than what is needed. 3 days prior to the procedure should be sufficient.

## 2019-09-27 ENCOUNTER — Telehealth: Payer: Self-pay | Admitting: *Deleted

## 2019-09-27 NOTE — Telephone Encounter (Signed)
Patient with diagnosis of afib on Eliquis for anticoagulation.    Procedure: PROSTATE BX   Date of procedure: 10/06/2019  CHADS2-VASc score of  3 (HTN, AGE,  CAD)  CrCl 83 ml/min  Per office protocol, patient can hold Eliquis for 2 days prior to procedure.

## 2019-09-27 NOTE — Telephone Encounter (Signed)
   [facility] Pre-operative Risk Assessment    Request for surgical clearance:  1. What type of surgery is being performed? PROSTATE BX    2. When is this surgery scheduled? 10/06/19   3. What type of clearance is required (medical clearance vs. Pharmacy clearance to hold med vs. Both)? BOTH  4. Are there any medications that need to be held prior to surgery and how long? ELIQUIS   5. Practice name and name of physician performing surgery? [facility]; DR. Legrand Como WOLFF   6. What is your office phone number [phone]    7.   What is your office fax number [phone]  8.   Anesthesia type (None, local, MAC, general) ? NOT LISTED    Julaine Hua 09/27/2019, 4:39 PM  _________________________________________________________________   (provider comments below)

## 2019-09-27 NOTE — Telephone Encounter (Signed)
   Primary Cardiologist: Candee Furbish, MD  Chart reviewed as part of pre-operative protocol coverage. Given past medical history and time since last visit, based on ACC/AHA guidelines, Bradley Gilbert would be at acceptable risk for the planned procedure without further cardiovascular testing.   Patient with diagnosis of afib on Eliquis for anticoagulation.    Procedure: PROSTATE BX Date of procedure: 10/06/2019  CHADS2-VASc score of  3 (HTN, AGE,  CAD)  CrCl 83 ml/min  Per office protocol, patient can hold Eliquis for 2 days prior to procedure.    I will route this recommendation to the requesting party via Epic fax function and remove from pre-op pool.  Please call with questions.  Kathyrn Drown, NP 09/27/2019, 5:09 PM

## 2019-10-03 ENCOUNTER — Encounter
Admission: RE | Admit: 2019-10-03 | Discharge: 2019-10-03 | Disposition: A | Discharge status: Home / Self Care | Payer: Medicare Other | Source: Ambulatory Visit | Attending: Urology | Admitting: Urology

## 2019-10-03 ENCOUNTER — Other Ambulatory Visit: Payer: Self-pay

## 2019-10-03 DIAGNOSIS — Z01818 Encounter for other preprocedural examination: Secondary | ICD-10-CM | POA: Insufficient documentation

## 2019-10-03 DIAGNOSIS — R9431 Abnormal electrocardiogram [ECG] [EKG]: Secondary | ICD-10-CM | POA: Insufficient documentation

## 2019-10-03 DIAGNOSIS — I2510 Atherosclerotic heart disease of native coronary artery without angina pectoris: Secondary | ICD-10-CM | POA: Diagnosis not present

## 2019-10-03 DIAGNOSIS — Z20828 Contact with and (suspected) exposure to other viral communicable diseases: Secondary | ICD-10-CM | POA: Diagnosis not present

## 2019-10-03 DIAGNOSIS — I4891 Unspecified atrial fibrillation: Secondary | ICD-10-CM | POA: Diagnosis not present

## 2019-10-03 DIAGNOSIS — I454 Nonspecific intraventricular block: Secondary | ICD-10-CM | POA: Diagnosis not present

## 2019-10-03 HISTORY — DX: Nausea with vomiting, unspecified: R11.2

## 2019-10-03 HISTORY — DX: Other specified postprocedural states: Z98.890

## 2019-10-03 HISTORY — DX: Other complications of anesthesia, initial encounter: T88.59XA

## 2019-10-03 LAB — SARS CORONAVIRUS 2 (TAT 6-24 HRS): SARS Coronavirus 2: NEGATIVE

## 2019-10-03 NOTE — Patient Instructions (Addendum)
Your procedure is scheduled on: 10-06-19 THURSDAY Report to Same Day Surgery 2nd floor medical mall [facility] Entrance-take elevator on left to 2nd floor.  Check in with surgery information desk.) To find out your arrival time please call [phone] between 1PM - 3PM on 10-05-19 [facility]  Remember: Instructions that are not followed completely may result in serious medical risk, up to and including death, or upon the discretion of your surgeon and anesthesiologist your surgery may need to be rescheduled.    _x___ 1. Do not eat food after midnight the night before your procedure. NO GUM OR CANDY AFTER MIDNIGHT. You may drink clear liquids up to 2 hours before you are scheduled to arrive at the hospital for your procedure.  Do not drink clear liquids within 2 hours of your scheduled arrival to the hospital.  Clear liquids include  --Water or Apple juice without pulp  --Gatorade  --Black Coffee or Clear Tea (No milk, no creamers, do not add anything to the coffee or Tea   ____Ensure clear carbohydrate drink on the way to the hospital for bariatric patients  ____Ensure clear carbohydrate drink 3 hours before surgery.     __x__ 2. No Alcohol for 24 hours before or after surgery.   __x__3. No Smoking or e-cigarettes for 24 prior to surgery.  Do not use any chewable tobacco products for at least 6 hour prior to surgery   ____  4. Bring all medications with you on the day of surgery if instructed.    __x__ 5. Notify your doctor if there is any change in your medical condition     (cold, fever, infections).    x___6. On the morning of surgery brush your teeth with toothpaste and water.  You may rinse your mouth with mouth wash if you wish.  Do not swallow any toothpaste or mouthwash.   Do not wear jewelry, make-up, hairpins, clips or nail polish.  Do not wear lotions, powders, or perfumes. You may wear deodorant.  Do not shave 48 hours prior to surgery. Men may shave face and  neck.  Do not bring valuables to the hospital.    The [facility] is not responsible for any belongings or valuables.               Contacts, dentures or bridgework may not be worn into surgery.  Leave your suitcase in the car. After surgery it may be brought to your room.  For patients admitted to the hospital, discharge time is determined by your treatment team.  _  Patients discharged the day of surgery will not be allowed to drive home.  You will need someone to drive you home and stay with you the night of your procedure.    Please read over the following fact sheets that you were given:   [facility] Preparing for Surgery   _x___ TAKE THE FOLLOWING MEDICATION THE MORNING OF SURGERY WITH A SMALL SIP OF WATER. These include:  1. LIPITOR (ATORVASTATIN)  2. BRING YOUR CARDIZEM (DILITAZEM) AND YOUR METOPROLOL WITH YOU TO [place] AM OF SURGERY   3.  4.  5.  6.  _X___Fleets enema as directed-DO FLEET ENEMA AT [place] 1 HOUR PRIOR TO ARRIVAL TIME TO HOSPITAL  ____ Use CHG Soap or sage wipes as directed on instruction sheet   ____ Use inhalers on the day of surgery and bring to hospital day of surgery  ____ Stop Metformin and Janumet 2 days prior to  surgery.    ____ Take 1/2 of usual insulin dose the night before surgery and none on the morning surgery.   _x___ Follow recommendations from Cardiologist, Pulmonologist or PCP regarding  stopping Aspirin, Coumadin, Plavix ,Eliquis, Effient, or Pradaxa, and Pletal- PT HAS BEEN INSTRUCTED TO STOP ELIQUIS AFTER TODAY (10-03-19)  X____Stop Anti-inflammatories such as Advil, Aleve, Ibuprofen, Motrin, Naproxen, Naprosyn, Goodies powders or aspirin products NOW-OK to take Tylenol    ____ Stop supplements until after surgery   ____ Bring C-Pap to the hospital.

## 2019-10-03 NOTE — Pre-Procedure Instructions (Signed)
MESSAGED DR [id number] REGARDING ABNORMAL EKG. SHE COMPARED IT WITH EKG DONE IN FEB 2020 AND SAID THAT WE NEED TO SEND IT TO CARDIOLOGIST. PT HAS ALREADY BEEN CLEARED BUT ANESTHESIA REQUIRES EKG TO BE DONE WITHIN 6 MONTHS AND HIS WAS OUTSIDE THAT 6 MONTH WINDOW. FAXED THIS EKG WITH CLEARANCE TO CARDIOLOGIST DR Marlou Porch

## 2019-10-04 NOTE — H&P (Signed)
NAME: Bradley Gilbert, Bradley Gilbert. MEDICAL RECORD [id number] ACCOUNT [id number] DATE OF BIRTH:Sep 07, 1951 FACILITY: [facility] LOCATION: [facility] PHYSICIAN:Camera Krienke Farrel Conners, MD  HISTORY AND PHYSICAL  DATE OF ADMISSION:  10/06/2019  10/06/2019 CHIEF COMPLAINT:  Elevated PSA.  HISTORY OF PRESENT ILLNESS:  The patient is a 68 year old white male found to have an elevated PSA of 12.16 ng/mL.  This was evaluated with a prostate MRI scan on 09/21/2019 indicating a 334 mL prostate with a 2.7 cm PI-RADS category 5 lesion involving  the right side of the prostate.  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  The patient comes in now for UroNav fusion biopsy of the prostate.  ALLERGIES:  No drug allergies.  CURRENT MEDICATIONS:  Metoprolol, Eliquis, potassium.  PAST SURGICAL HISTORY:  Right knee arthroscopy 2005, right inguinal herniorrhaphy 2010, left rotator cuff repair 2017, cardiac ablation for atrial fibrillation February 2020.  PAST AND CURRENT MEDICAL CONDITIONS:  Atrial fibrillation.  REVIEW OF SYSTEMS:  The patient has decreased auditory and visual acuity.  He denies chest pain, shortness of breath, stroke, diabetes or coronary artery disease.  SOCIAL HISTORY:  The patient denied tobacco or alcohol use.  FAMILY HISTORY:  Father died at age 37 of heart disease.  Mother died at age 65 of a stroke.  There is no family history of prostate cancer.  PHYSICAL EXAMINATION: VITAL SIGNS:  Height 5 feet 10, weight 200 pounds. GENERAL:  A well-nourished, white male in no acute distress. HEENT:  Sclerae were clear.  Pupils are equally round, reactive to light and accommodation.  Extraocular movements are intact. NECK:  No palpable cervical adenopathy.  No audible carotid bruits. LYMPHATIC:  No palpable inguinal adenopathy. PULMONARY:  Lungs clear to auscultation. CARDIOVASCULAR:  No audible  murmurs.  He had a regular rhythm. ABDOMEN:  Soft without any CVA tenderness. GENITOURINARY:  The patient was circumcised.  Testes were smooth and nontender. RECTAL:  A 40 g, smooth, nontender prostate. NEUROMUSCULAR:  Alert and oriented x3.  IMPRESSION: 1.  Elevated PSA. 2.  PI-RADS category 5 lesion of the right side of the prostate.  PLAN:  UroNav fusion biopsy of the prostate.  PN/NUANCE  D:10/03/2019 T:10/03/2019 JOB:[id number]

## 2019-10-05 ENCOUNTER — Telehealth: Payer: Self-pay | Admitting: Cardiology

## 2019-10-05 MED ORDER — CEFAZOLIN SODIUM-DEXTROSE 1-4 GM/50ML-% IV SOLN
1.0000 g | Freq: Once | INTRAVENOUS | Status: AC
  Administered 2019-10-06: 1 g via INTRAVENOUS

## 2019-10-05 MED ORDER — GENTAMICIN SULFATE 40 MG/ML IJ SOLN
80.0000 mg | Freq: Once | INTRAVENOUS | Status: AC
  Administered 2019-10-06: 80 mg via INTRAVENOUS
  Filled 2019-10-05: qty 2

## 2019-10-05 NOTE — Telephone Encounter (Signed)
Clearance has been faxed to surgeon Dr. Maryan Puls. I will remove from the pre op call back pool.

## 2019-10-05 NOTE — Pre-Procedure Instructions (Signed)
Secure Chat with Dr Marlou Porch today- He looked at EKG on 10-03-19 and said that pt is still ok to proceed with his urology surgery tomorrow

## 2019-10-05 NOTE — Telephone Encounter (Signed)
Fax received today in regards to pt's surgery clearance. 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 I know Roby Lofts, PA tried to reach out to the pt earlier this morning though no answer. I will route this note to pre op. EKG is in Epic form provider to review.

## 2019-10-05 NOTE — Telephone Encounter (Signed)
Spoke with [employer]. Please see telephone note from 09/27/2019 for further details.   Abigail Butts, PA-C 10/05/19; 2:16 PM

## 2019-10-05 NOTE — Telephone Encounter (Signed)
New Message     Bradley Gilbert is calling from preadmit testing at [facility] and says the pt is scheduled for surgery and was cleared but they did an EKG and it came back abnormal  They would like for Dr Marlou Porch to check it over before following through with the surgery     Please call back

## 2019-10-05 NOTE — Telephone Encounter (Signed)
   Received a call from [facility] in pre-admit testing that there was concern for EKG changes on the EKG performed in preparation for prostate biopsy tomorrow. EKG dated 10/03/2019 reviewed. He has a chronic non-specific IVCD and early repolarization changes but no STE/D or TWI to suggest ischemia. Overall no significant change from previous and no further cardia work-up is required prior to surgery. Patient denied any changes in cardiac symptoms since clearance was provided earlier this month. Patient is cleared to proceed with surgery as planned.   Will route this recommendation to Pre-admit testing at [facility].   Abigail Butts, PA-C 10/05/19; 11:47 AM

## 2019-10-06 ENCOUNTER — Encounter
Admission: RE | Disposition: A | Discharge status: Home / Self Care | Payer: Self-pay | Source: Home / Self Care | Attending: Urology

## 2019-10-06 ENCOUNTER — Ambulatory Visit
Admission: RE | Admit: 2019-10-06 | Discharge: 2019-10-06 | Disposition: A | Discharge status: Home / Self Care | Payer: Medicare Other | Attending: Urology | Admitting: Urology

## 2019-10-06 ENCOUNTER — Ambulatory Visit: Payer: Medicare Other | Admitting: Anesthesiology

## 2019-10-06 ENCOUNTER — Encounter: Payer: Self-pay | Admitting: *Deleted

## 2019-10-06 ENCOUNTER — Other Ambulatory Visit: Payer: Self-pay

## 2019-10-06 DIAGNOSIS — C61 Malignant neoplasm of prostate: Secondary | ICD-10-CM | POA: Insufficient documentation

## 2019-10-06 DIAGNOSIS — I2510 Atherosclerotic heart disease of native coronary artery without angina pectoris: Secondary | ICD-10-CM | POA: Diagnosis not present

## 2019-10-06 DIAGNOSIS — R972 Elevated prostate specific antigen [PSA]: Secondary | ICD-10-CM | POA: Diagnosis present

## 2019-10-06 DIAGNOSIS — Z7901 Long term (current) use of anticoagulants: Secondary | ICD-10-CM | POA: Diagnosis not present

## 2019-10-06 DIAGNOSIS — I4891 Unspecified atrial fibrillation: Secondary | ICD-10-CM | POA: Insufficient documentation

## 2019-10-06 DIAGNOSIS — I10 Essential (primary) hypertension: Secondary | ICD-10-CM | POA: Diagnosis not present

## 2019-10-06 DIAGNOSIS — Z8249 Family history of ischemic heart disease and other diseases of the circulatory system: Secondary | ICD-10-CM | POA: Diagnosis not present

## 2019-10-06 DIAGNOSIS — Z79899 Other long term (current) drug therapy: Secondary | ICD-10-CM | POA: Diagnosis not present

## 2019-10-06 HISTORY — PX: PROSTATE BIOPSY: SHX241

## 2019-10-06 SURGERY — BIOPSY, PROSTATE
Anesthesia: General

## 2019-10-06 MED ORDER — ONDANSETRON HCL 4 MG/2ML IJ SOLN
INTRAMUSCULAR | Status: AC
  Filled 2019-10-06: qty 2

## 2019-10-06 MED ORDER — GLYCOPYRROLATE 0.2 MG/ML IJ SOLN
INTRAMUSCULAR | Status: AC
  Filled 2019-10-06: qty 1

## 2019-10-06 MED ORDER — FENTANYL CITRATE (PF) 100 MCG/2ML IJ SOLN
INTRAMUSCULAR | Status: AC
  Filled 2019-10-06: qty 2

## 2019-10-06 MED ORDER — LIDOCAINE HCL (CARDIAC) PF 100 MG/5ML IV SOSY
PREFILLED_SYRINGE | INTRAVENOUS | Status: DC | PRN
  Administered 2019-10-06: 100 mg via INTRAVENOUS

## 2019-10-06 MED ORDER — FAMOTIDINE 20 MG PO TABS
20.0000 mg | ORAL_TABLET | Freq: Once | ORAL | Status: AC
  Administered 2019-10-06: 08:00:00 20 mg via ORAL

## 2019-10-06 MED ORDER — MIDAZOLAM HCL 2 MG/2ML IJ SOLN
INTRAMUSCULAR | Status: DC | PRN
  Administered 2019-10-06: 2 mg via INTRAVENOUS

## 2019-10-06 MED ORDER — FLEET ENEMA 7-19 GM/118ML RE ENEM: 1.0000 | ENEMA | Freq: Once | RECTAL | Status: DC

## 2019-10-06 MED ORDER — GLYCOPYRROLATE 0.2 MG/ML IJ SOLN
INTRAMUSCULAR | Status: DC | PRN
  Administered 2019-10-06: 0.2 mg via INTRAVENOUS

## 2019-10-06 MED ORDER — ONDANSETRON HCL 4 MG/2ML IJ SOLN
INTRAMUSCULAR | Status: DC | PRN
  Administered 2019-10-06: 4 mg via INTRAVENOUS

## 2019-10-06 MED ORDER — CEFAZOLIN SODIUM-DEXTROSE 1-4 GM/50ML-% IV SOLN
INTRAVENOUS | Status: AC
  Filled 2019-10-06: qty 50

## 2019-10-06 MED ORDER — PHENYLEPHRINE HCL (PRESSORS) 10 MG/ML IV SOLN
INTRAVENOUS | Status: DC | PRN
  Administered 2019-10-06 (×2): 100 ug via INTRAVENOUS

## 2019-10-06 MED ORDER — PROPOFOL 10 MG/ML IV BOLUS
INTRAVENOUS | Status: DC | PRN
  Administered 2019-10-06: 150 mg via INTRAVENOUS

## 2019-10-06 MED ORDER — LACTATED RINGERS IV SOLN
INTRAVENOUS | Status: DC
  Administered 2019-10-06: 08:00:00 via INTRAVENOUS

## 2019-10-06 MED ORDER — FAMOTIDINE 20 MG PO TABS
ORAL_TABLET | ORAL | Status: AC
  Administered 2019-10-06: 08:00:00 20 mg via ORAL
  Filled 2019-10-06: qty 1

## 2019-10-06 MED ORDER — LACTATED RINGERS IV SOLN
INTRAVENOUS | Status: DC | PRN
  Administered 2019-10-06: 09:00:00 via INTRAVENOUS

## 2019-10-06 MED ORDER — LEVOFLOXACIN 500 MG PO TABS: 500.0000 mg | ORAL_TABLET | Freq: Every day | ORAL | 0 refills | Status: DC

## 2019-10-06 MED ORDER — ONDANSETRON HCL 4 MG/2ML IJ SOLN: 4.0000 mg | Freq: Once | INTRAMUSCULAR | Status: DC | PRN

## 2019-10-06 MED ORDER — DEXAMETHASONE SODIUM PHOSPHATE 10 MG/ML IJ SOLN
INTRAMUSCULAR | Status: DC | PRN
  Administered 2019-10-06: 10 mg via INTRAVENOUS

## 2019-10-06 MED ORDER — FENTANYL CITRATE (PF) 100 MCG/2ML IJ SOLN
INTRAMUSCULAR | Status: DC | PRN
  Administered 2019-10-06: 25 ug via INTRAVENOUS
  Administered 2019-10-06: 50 ug via INTRAVENOUS
  Administered 2019-10-06: 25 ug via INTRAVENOUS

## 2019-10-06 MED ORDER — DEXAMETHASONE SODIUM PHOSPHATE 10 MG/ML IJ SOLN
INTRAMUSCULAR | Status: AC
  Filled 2019-10-06: qty 1

## 2019-10-06 MED ORDER — MIDAZOLAM HCL 2 MG/2ML IJ SOLN
INTRAMUSCULAR | Status: AC
  Filled 2019-10-06: qty 2

## 2019-10-06 MED ORDER — FENTANYL CITRATE (PF) 100 MCG/2ML IJ SOLN: 25.0000 ug | INTRAMUSCULAR | Status: DC | PRN

## 2019-10-06 SURGICAL SUPPLY — 10 items
COVER MAYO STAND REUSABLE (DRAPES) ×2 IMPLANT
COVER WAND RF STERILE (DRAPES) ×2 IMPLANT
GLOVE BIO SURGEON STRL SZ7 (GLOVE) ×4 IMPLANT
GUIDE NDL ENDOCAV 16-18 CVR (NEEDLE) IMPLANT
GUIDE NEEDLE ENDOCAV 16-18 CVR (NEEDLE) IMPLANT
INST BIOPSY MAXCORE 18GX25 (NEEDLE) ×2 IMPLANT
NDL GUIDE BIOPSY 644068 (NEEDLE) IMPLANT
NEEDLE GUIDE BIOPSY 644068 (NEEDLE) IMPLANT
SURGILUBE 2OZ TUBE FLIPTOP (MISCELLANEOUS) ×2 IMPLANT
TOWEL OR 17X26 4PK STRL BLUE (TOWEL DISPOSABLE) ×2 IMPLANT

## 2019-10-06 NOTE — Anesthesia Procedure Notes (Signed)
Procedure Name: LMA Insertion Date/Time: 10/06/2019 8:54 AM Performed by: Justus Memory, CRNA Pre-anesthesia Checklist: Patient identified, Patient being monitored, Timeout performed, Emergency Drugs available and Suction available Patient Re-evaluated:Patient Re-evaluated prior to induction Oxygen Delivery Method: Circle system utilized Preoxygenation: Pre-oxygenation with 100% oxygen Induction Type: IV induction Ventilation: Mask ventilation without difficulty LMA: LMA inserted LMA Size: 4.5 Tube type: Oral Number of attempts: 1 Placement Confirmation: positive ETCO2 and breath sounds checked- equal and bilateral Tube secured with: Tape Dental Injury: Teeth and Oropharynx as per pre-operative assessment

## 2019-10-06 NOTE — Transfer of Care (Signed)
Immediate Anesthesia Transfer of Care Note  Patient: Bradley Gilbert  Procedure(s) Performed: PROSTATE BIOPSY (N/A )  Patient Location: PACU  Anesthesia Type:General  Level of Consciousness: sedated  Airway & Oxygen Therapy: Patient Spontanous Breathing and Patient connected to face mask oxygen  Post-op Assessment: Report given to RN and Post -op Vital signs reviewed and stable  Post vital signs: Reviewed and stable  Last Vitals:  Vitals Value Taken Time  BP 103/79 10/06/19 0926  Temp    Pulse 78 10/06/19 0928  Resp 11 10/06/19 0928  SpO2 99 % 10/06/19 0928  Vitals shown include unvalidated device data.  Last Pain:  Vitals:   10/06/19 0724  TempSrc: Oral  PainSc: 0-No pain         Complications: No apparent anesthesia complications

## 2019-10-06 NOTE — H&P (Signed)
Date of Initial H&P: 10/03/19  History reviewed, patient examined, no change in status, stable for surgery.

## 2019-10-06 NOTE — Anesthesia Post-op Follow-up Note (Signed)
Anesthesia QCDR form completed.        

## 2019-10-06 NOTE — Discharge Instructions (Addendum)
Transrectal Ultrasound-Guided Prostate Biopsy, Care After This sheet gives you information about how to care for yourself after your procedure. Your health care provider may also give you more specific instructions. If you have problems or questions, contact your health care provider. What can I expect after the procedure? After the procedure, it is common to have:  Pain and discomfort near your rectum, especially while sitting.  Pink-colored urine due to small amounts of blood in your urine.  A burning feeling while urinating.  Blood in your stool (feces) or bleeding from your rectum.  Blood in your semen. Follow these instructions at home: Medicines  Take over-the-counter and prescription medicines only as told by your health care provider.  If you were prescribed antibiotic medicine, take it as told by your health care provider. Do not stop taking the antibiotic even if you start to feel better. Activity  Do not drive for 24 hours if you were given a medicine to help you relax (sedative) during your procedure.  Return to your normal activities as told by your health care provider. Ask your health care provider what activities are safe for you.  Ask your health care provider when it is okay for you to resume sexual activity.  Do not lift anything that is heavier than 10 lb (4.5 kg), or the limit that you are told, until your health care provider says that it is safe. General instructions   Drink enough water to keep your urine pale yellow.  Watch your urine, stool, and semen for new or increased bleeding.  Keep all follow-up visits as told by your health care provider. This is important. Contact a health care provider if:  You have any of the following: ? Blood clots in your urine or stool. ? Blood in your urine more than 2 weeks after the procedure. ? Blood in your semen more than 2 months after the procedure. ? New or increased bleeding in your urine, stool, or  semen. ? Severe pain in your abdomen.  Your urine smells bad or unusual.  You have trouble urinating.  Your lower abdomen feels firm.  You have problems getting an erection.  You have nausea or you vomit. Get help right away if:  You have a fever or chills. This could be a sign of infection.  You have bright red urine.  You have severe pain that does not get better with medicine.  You cannot urinate. Summary  After this procedure, it is common to have pain and discomfort around your rectum, especially while sitting.  You may have blood in your urine and stool after the procedure.  It is common to have blood in your semen for 1-2 months after this procedure.  If you were prescribed antibiotic medicine, take it as told by your health care provider. Do not stop taking the antibiotic even if you start to feel better.  Get help right away if you have a fever or chills. This could be a sign of infection. This information is not intended to replace advice given to you by your health care provider. Make sure you discuss any questions you have with your health care provider. Document Released: 05/04/2017 Document Revised: 03/02/2019 Document Reviewed: 05/04/2017 Elsevier Patient Education  2020 [place]   1) The drugs that you were given will stay in your system until tomorrow so for the next 24 hours you should not:  A) Drive an automobile B) Make any legal  decisions C) Drink any alcoholic beverage   2) You may resume regular meals tomorrow.  Today it is better to start with liquids and gradually work up to solid foods.  You may eat anything you prefer, but it is better to start with liquids, then soup and crackers, and gradually work up to solid foods.   3) Please notify your doctor immediately if you have any unusual bleeding, trouble breathing, redness and pain at the surgery site, drainage, fever, or pain not relieved  by medication.    4) Additional Instructions:        Please contact your physician with any problems or Same Day Surgery at [phone], Monday through Friday 6 am to 4 pm, or [place] at [facility] number at [phone].

## 2019-10-06 NOTE — Op Note (Signed)
Preoperative diagnosis: 1.  Elevated PSA                                            2.  PI-RADS category 5 lesion right side of the prostate                                            3.  Possible seminal vesicle invasion on MRI scan  Postoperative diagnosis: Same  Procedure: Uronav fusion transrectal ultrasound-guided needle biopsy of the prostate  Surgeon: Otelia Limes. Yves Dill MD  Anesthesia: General  Indications:See the history and physical. After informed consent the above procedure(s) were performed.       Technique and findings: After adequate general anesthesia been obtained the patient was placed into lateral decubitus position.  Finger sweep confirmed clean rectal vault.  Transrectal ultrasound probe was then placed and ultrasound images acquired.  The ultrasound images were then fused with the MRI images.  The region of interest was identified and 4 core biopsies obtained from this location.  Next, core biopsies were taken of both seminal vesicles.  Finally, standard 12 core systematic biopsies were performed.  The transrectal ultrasound probe was then removed and procedure terminated.  Blood loss was minimal.  Patient was then transferred to the recovery room in stable condition.

## 2019-10-06 NOTE — Anesthesia Preprocedure Evaluation (Addendum)
Anesthesia Evaluation  Patient identified by MRN, date of birth, ID band Patient awake    Reviewed: Allergy & Precautions, NPO status , Patient's Chart, lab work & pertinent test results, reviewed documented beta blocker date and time   History of Anesthesia Complications (+) PONV and history of anesthetic complications  Airway Mallampati: III       Dental   Pulmonary neg sleep apnea, neg COPD, Not current smoker,           Cardiovascular hypertension, Pt. on medications and Pt. on home beta blockers + CAD  (-) Past MI and (-) CHF (-) dysrhythmias (-) Valvular Problems/Murmurs     Neuro/Psych neg Seizures    GI/Hepatic Neg liver ROS, neg GERD  ,  Endo/Other  neg diabetes  Renal/GU negative Renal ROS     Musculoskeletal   Abdominal   Peds  Hematology   Anesthesia Other Findings   Reproductive/Obstetrics                            Anesthesia Physical Anesthesia Plan  ASA: II  Anesthesia Plan: General   Post-op Pain Management:    Induction:   PONV Risk Score and Plan: 3 and Dexamethasone, Ondansetron and Treatment may vary due to age or medical condition  Airway Management Planned: Oral ETT  Additional Equipment:   Intra-op Plan:   Post-operative Plan:   Informed Consent: I have reviewed the patients History and Physical, chart, labs and discussed the procedure including the risks, benefits and alternatives for the proposed anesthesia with the patient or authorized representative who has indicated his/her understanding and acceptance.       Plan Discussed with:   Anesthesia Plan Comments:         Anesthesia Quick Evaluation

## 2019-10-06 NOTE — Anesthesia Postprocedure Evaluation (Signed)
Anesthesia Post Note  Patient: Bradley Gilbert  Procedure(s) Performed: PROSTATE BIOPSY (N/A )  Patient location during evaluation: PACU Anesthesia Type: General Level of consciousness: awake and alert Pain management: pain level controlled Vital Signs Assessment: post-procedure vital signs reviewed and stable Respiratory status: spontaneous breathing and respiratory function stable Cardiovascular status: stable Anesthetic complications: no     Last Vitals:  Vitals:   10/06/19 0724 10/06/19 0927  BP: 134/72 103/79  Pulse: 67 78  Resp: 14 16  Temp: 36.8 C (!) 35.9 C  SpO2: 100% 99%    Last Pain:  Vitals:   10/06/19 0927  TempSrc:   PainSc: Asleep                 Mendy Chou,Muriel K

## 2019-10-07 ENCOUNTER — Encounter: Payer: Self-pay | Admitting: Urology

## 2019-10-10 ENCOUNTER — Encounter: Payer: Self-pay | Admitting: Urology

## 2019-10-10 LAB — SURGICAL PATHOLOGY

## 2019-10-28 ENCOUNTER — Encounter: Payer: Self-pay | Admitting: Cardiology

## 2019-10-28 ENCOUNTER — Ambulatory Visit: Payer: Medicare Other | Admitting: Cardiology

## 2019-10-28 ENCOUNTER — Other Ambulatory Visit: Payer: Self-pay

## 2019-10-28 VITALS — BP 126/80 | HR 68 | Ht 71.0 in | Wt 200.0 lb

## 2019-10-28 DIAGNOSIS — I2510 Atherosclerotic heart disease of native coronary artery without angina pectoris: Secondary | ICD-10-CM | POA: Diagnosis not present

## 2019-10-28 DIAGNOSIS — I10 Essential (primary) hypertension: Secondary | ICD-10-CM

## 2019-10-28 DIAGNOSIS — E785 Hyperlipidemia, unspecified: Secondary | ICD-10-CM

## 2019-10-28 NOTE — Patient Instructions (Signed)
Medication Instructions:   Your physician recommends that you continue on your current medications as directed. Please refer to the Current Medication list given to you today.  *If you need a refill on your cardiac medications before your next appointment, please call your pharmacy*     Follow-Up: At [facility], you and your health needs are our priority.  As part of our continuing mission to provide you with exceptional heart care, we have created designated Provider Care Teams.  These Care Teams include your primary Cardiologist (physician) and Advanced Practice Providers (APPs -  Physician Assistants and Nurse Practitioners) who all work together to provide you with the care you need, when you need it.  Your next appointment:   12 month(s)  The format for your next appointment:   In Person  Provider:   Candee Furbish, MD

## 2019-10-28 NOTE — Progress Notes (Signed)
Cardiology Office Note:    Date:  10/28/2019   ID:  Bradley Gilbert, DOB 16-Mar-1951, MRN [id number]  PCP:  Josetta Huddle, MD  Cardiologist:  Candee Furbish, MD   Referring MD: Josetta Huddle, MD     History of Present Illness:    Bradley Gilbert "Bradley Gilbert" is a 68 y.o. male here for evaluation of CAD/atrial fibrillation at the request of Dr. Inda Merlin.  Last seen in our atrial fibrillation clinic on 07/08/2018 after 2 separate admissions in June 2018 and in August 2019 with atrial fibrillation.  Had mild CAD by cardiac catheterization in 2012.  In June 2018 when he was found to be in atrial relation he was diaphoretic, vomiting.  Mild flat troponin elevation demand ischemia.  Vertigo, ataxia.  IV hydration helped out quite a bit.  When he was seen in emergency room follow-up in the atrial fibrillation clinic he was in sinus rhythm.  Sleep study was negative.  He went for approximately 14 months without atrial fibrillation and then after a hot day, felt dizzy and was found to have heart rates 1 60-1 80.  Blood pressure was soft.  He converted after first dose of metoprolol.  Eliquis was started because of chads vas score of 2 for hypertension and age.  He is back in sinus rhythm.  Has a left bundle branch block underlying.  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  Otherwise doing quite well.  No fevers chills nausea vomiting syncope bleeding orthopnea PND.  Friend of [name], my friend. Cowboy.   01/25/2019- here for the follow-up of atrial fibrillation ablation.  Has been doing quite well without any further evidence of atrial fibrillation over the past week.  He did notice a subtle "bump "at the site, venous puncture site, explained to him that this is fairly normal.  Obviously if this enlarges or became more of an issue he would let us know.  He still feels tired.  Still having trouble sleeping.  The pharmacist told him to stay away from trazodone  due to potential complications  interactions with trazodone.  Denies any fevers chills nausea vomiting syncope.  10/28/2019-here for follow-up post atrial fibrillation ablation.  Doing very well.  Unfortunately he was diagnosed with prostate cancer.  Being treated at [place], radiation.  Doing well currently.  No further palpitations or evidence of A. fib.  He is continuing with his Eliquis.  No bleeding issues.   Past Medical History:  Diagnosis Date   Allergic rhinitis    Complication of anesthesia    hard time waking up    Coronary artery disease    Cath 08/15/11 with 30% mid LAD, o/w no CAD   Hearing loss    History of colon polyps    BENGIN   Left rotator cuff tear    OA (osteoarthritis)    KNEES AND LEFT SHOULDER   Paroxysmal atrial fibrillation (HCC)    PONV (postoperative nausea and vomiting)     Past Surgical History:  Procedure Laterality Date   ATRIAL FIBRILLATION ABLATION N/A 01/18/2019   Procedure: ATRIAL FIBRILLATION ABLATION;  Surgeon: Thompson Grayer, MD;  Location: [facility];  Service: Cardiovascular;  Laterality: N/A;   CARDIAC CATHETERIZATION  08-15-2011  DR BENSIMHON   FALSE POSTIVE STRESS TEST--  MINIMAL NON-BSTRUCTIVE CAD ,  mLAD 30%, LM congenitally absent (there was seprate ostia for the lad & the lcfx)/   NORMAL LVF  CARPAL TUNNEL RELEASE Right 1990   COLONOSCOPY     KNEE ARTHROSCOPY Right YRS AGO   LAPAROSCOPIC INGUINAL HERNIA REPAIR Right 09-05-2011   POLYPECTOMY     PROSTATE BIOPSY N/A 10/06/2019   Procedure: PROSTATE BIOPSY;  Surgeon: Royston Cowper, MD;  Location: [facility];  Service: Urology;  Laterality: N/A;   SHOULDER ARTHROSCOPY WITH SUBACROMIAL DECOMPRESSION AND BICEP TENDON REPAIR Left 05/12/2014   Procedure: LEFT ARTHROSCOPY SHOULDER WITH EXAM UNDER ANESTHESIA,SUBACROMIAL DECOMPRESSION DISTAL CLAVICAL RESECTION LABIAL REPAIR VERSES DEBRIDEMENT;  Surgeon: Sydnee Cabal, MD;  Location: [facility];  Service: Orthopedics;  Laterality: Left;   TRANSTHORACIC ECHOCARDIOGRAM  08-19-2011   MILD LVH/  EF [id number]  GRADE I DIASTOLIC DYSFUNCTION    Current Medications: Current Meds  Medication Sig   atorvastatin (LIPITOR) 20 MG tablet TAKE 1 TABLET BY MOUTH  DAILY   diltiazem (CARDIZEM) 30 MG tablet Take 1 tablet (30 mg total) by mouth as directed. Take one tablet by mouth every 4 hours AS NEEDED for A-fib HR > 100 as long as BP > 100.   ELIQUIS 5 MG TABS tablet TAKE 1 TABLET TWICE DAILY   metoprolol succinate (TOPROL XL) 25 MG 24 hr tablet Take 1 tablet (25 mg total) by mouth at bedtime.   metoprolol tartrate (LOPRESSOR) 25 MG tablet Take 0.5 tablets (12.5 mg total) by mouth every 6 (six) hours as needed (breakthrough afib).   potassium chloride SA (KLOR-CON) 20 MEQ tablet TAKE 1 TABLET BY MOUTH  TWICE DAILY     Allergies:   Shellfish allergy and Sulfa antibiotics   Social History   Socioeconomic History   Marital status: Married    Spouse name: Not on file   Number of children: Not on file   Years of education: Not on file   Highest education level: Not on file  Occupational History   Occupation: Diet Therapist, music: [employer]   Financial resource strain: Not on file   Food insecurity    Worry: Not on file    Inability: Not on file   Transportation needs    Medical: Not on file    Non-medical: Not on file  Tobacco Use   Smoking status: Never Smoker   Smokeless tobacco: Never Used  Substance and Sexual Activity   Alcohol use: No    Alcohol/week: 0.0 standard drinks   Drug use: No   Sexual activity: Not on file  Lifestyle   Physical activity    Days per week: Not on file    Minutes per session: Not on file   Stress: Not on file  Relationships   Social connections    Talks on phone: Not on file    Gets together: Not on file    Attends religious service: Not on file    Active member of club or organization:  Not on file    Attends meetings of clubs or organizations: Not on file    Relationship status: Not on file  Other Topics Concern   Not on file  Social History Narrative   Graduate of [employer].  Married - '85.  1 son - '70,  2 daughters - '74, '87;   5 grandchildren.  Hobbies - keeps horses and ponies.  Marriage - good health              Family History: The patient's family history includes COPD in his father; Cancer in his father; Diabetes in his mother; Heart  disease in his father. There is no history of Colon cancer, Prostate cancer, Rectal cancer, or Stomach cancer.  ROS:   Please see the history of present illness.     All other systems reviewed and are negative.  EKGs/Labs/Other Studies Reviewed:    The following studies were reviewed today:  Echo 04/2017: - Left ventricle: The cavity size was normal. Wall thickness was   normal. Systolic function was normal. The estimated ejection   fraction was in the range of 55% to 60%. Wall motion was normal;   there were no regional wall motion abnormalities. Features are   consistent with a pseudonormal left ventricular filling pattern,   with concomitant abnormal relaxation and increased filling   pressure (grade 2 diastolic dysfunction). - Aortic valve: There was no stenosis. - Mitral valve: There was trivial regurgitation. - Left atrium: The atrium was mildly dilated. - Right ventricle: The cavity size was normal. Systolic function   was normal. - Pulmonary arteries: No complete TR doppler jet so unable to   estimate PA systolic pressure. - Inferior vena cava: The vessel was normal in size. The   respirophasic diameter changes were in the normal range (>= 50%),   consistent with normal central venous pressure.  Impressions:  - Normal LV size with EF 55-60%. Moderate diastolic dysfunction.   Normal RV size and systolic function. No significant valvular   abnormalities.  EKG:  EKG is not ordered today.     Recent Labs: 01/12/2019: Hemoglobin 15.8; Platelets 205 01/19/2019: BUN 17; Magnesium 2.1; Potassium 3.6; Sodium 137 09/21/2019: Creatinine, Ser 1.00  Recent Lipid Panel    Component Value Date/Time   CHOL 105 09/10/2018 0439   TRIG 81 09/10/2018 0439   HDL 37 (L) 09/10/2018 0439   CHOLHDL 2.8 09/10/2018 0439   VLDL 16 09/10/2018 0439   LDLCALC 52 09/10/2018 0439   LDLDIRECT 102.0 02/18/2016 1647    Physical Exam:    VS:  BP 126/80    Pulse 68    Ht 5\' 11"  (1.803 m)    Wt 200 lb (90.7 kg)    SpO2 96%    BMI 27.89 kg/m     Wt Readings from Last 3 Encounters:  10/28/19 200 lb (90.7 kg)  10/06/19 200 lb (90.7 kg)  07/25/19 195 lb (88.5 kg)     GEN: Well nourished, well developed, in no acute distress  HEENT: normal  Neck: no JVD, carotid bruits, or masses Cardiac: RRR; no murmurs, rubs, or gallops,no edema  Respiratory:  clear to auscultation bilaterally, normal work of breathing GI: soft, nontender, nondistended, + BS MS: no deformity or atrophy  Skin: warm and dry, no rash Neuro:  Alert and Oriented x 3, Strength and sensation are intact Psych: euthymic mood, full affect    ASSESSMENT:    1. Coronary artery disease involving native coronary artery of native heart without angina pectoris   2. Essential hypertension   3. Hyperlipidemia, unspecified hyperlipidemia type    PLAN:    In order of problems listed above:  Paroxysmal atrial fibrillation -Underwent catheter ablation 01/18/2019 Dr. Rayann Heman. Off Dofetilide post ablation.    Doing very well.   Mild nonobstructive CAD 2012 left bundle branch block - Continue with aggressive secondary prevention.  Given his mild CAD, would consider statin therapy.  Last LDL 84 on 04/29/2017. atorvastatin 20 mg once a day.    If he begins to have any myalgias or difficulties, he will let us know.  His wife tried statins  in the past and could not tolerate them.  Continue current therapy.  Left bundle branch block - Stable, no  syncope.  No changes.  1 EKG recently had his QRS duration slightly less and did not label it as left bundle branch block but poor R wave progression possible infarct pattern.  This is still left bundle branch block overall morphology and reassurance was given.  Prostate cancer -- Lupron injections. No complications. 10/21/19. Then 30 radiation tx. 8 Gleason scale.   19-month follow-up.  Medication Adjustments/Labs and Tests Ordered: Current medicines are reviewed at length with the patient today.  Concerns regarding medicines are outlined above.  No orders of the defined types were placed in this encounter.  No orders of the defined types were placed in this encounter.   Patient Instructions  Medication Instructions:   Your physician recommends that you continue on your current medications as directed. Please refer to the Current Medication list given to you today.  *If you need a refill on your cardiac medications before your next appointment, please call your pharmacy*     Follow-Up: At [facility], you and your health needs are our priority.  As part of our continuing mission to provide you with exceptional heart care, we have created designated Provider Care Teams.  These Care Teams include your primary Cardiologist (physician) and Advanced Practice Providers (APPs -  Physician Assistants and Nurse Practitioners) who all work together to provide you with the care you need, when you need it.  Your next appointment:   12 month(s)  The format for your next appointment:   In Person  Provider:   Candee Furbish, MD       Signed, Candee Furbish, MD  10/28/2019 12:19 PM    [place]

## 2019-10-31 ENCOUNTER — Other Ambulatory Visit (HOSPITAL_COMMUNITY): Payer: Self-pay | Admitting: Nurse Practitioner

## 2019-10-31 ENCOUNTER — Other Ambulatory Visit: Payer: Self-pay | Admitting: Cardiology

## 2019-12-06 ENCOUNTER — Ambulatory Visit: Payer: Medicare Other | Admitting: Physician Assistant

## 2020-01-02 ENCOUNTER — Telehealth: Payer: Self-pay

## 2020-01-06 ENCOUNTER — Telehealth (INDEPENDENT_AMBULATORY_CARE_PROVIDER_SITE_OTHER): Payer: Medicare Other | Admitting: Internal Medicine

## 2020-01-06 DIAGNOSIS — D6869 Other thrombophilia: Secondary | ICD-10-CM

## 2020-01-06 DIAGNOSIS — I10 Essential (primary) hypertension: Secondary | ICD-10-CM

## 2020-01-06 DIAGNOSIS — I480 Paroxysmal atrial fibrillation: Secondary | ICD-10-CM

## 2020-01-06 NOTE — Progress Notes (Signed)
Electrophysiology TeleHealth Note   Due to national recommendations of social distancing due to COVID 19, an audio/video telehealth visit is felt to be most appropriate for this patient at this time.  See MyChart message from today for the patient's consent to telehealth for [facility].   Date:  01/06/2020   ID:  Bradley Gilbert, DOB 06/01/1951, MRN [id number]  Location: patient's home  Provider location:  [facility]  Evaluation Performed: Follow-up visit  PCP:  Josetta Huddle, MD   Electrophysiologist:  Dr Rayann Heman  Chief Complaint:  AF follow up  History of Present Illness:    Bradley Gilbert is a 69 y.o. male who presents via telehealth conferencing today.  Since last being seen in our clinic, the patient reports doing relatively well.  Since I last saw him, he has been diagnosed with prostate cancer and is undergoing treatment at [facility].   He has had his 1st COVID vaccine and is scheduled for his second dose in a couple of weeks. Today, he denies symptoms of palpitations, chest pain, shortness of breath,  lower extremity edema, dizziness, presyncope, or syncope.  The patient is otherwise without complaint today.  The patient denies symptoms of fevers, chills, cough, or new SOB worrisome for COVID 19.  Past Medical History:  Diagnosis Date  . Allergic rhinitis   . Complication of anesthesia    hard time waking up   . Coronary artery disease    Cath 08/15/11 with 30% mid LAD, o/w no CAD  . Hearing loss   . History of colon polyps    BENGIN  . Left rotator cuff tear   . OA (osteoarthritis)    KNEES AND LEFT SHOULDER  . Paroxysmal atrial fibrillation (HCC)   . PONV (postoperative nausea and vomiting)     Past Surgical History:  Procedure Laterality Date  . ATRIAL FIBRILLATION ABLATION N/A 01/18/2019   Procedure: ATRIAL FIBRILLATION ABLATION;  Surgeon: Thompson Grayer, MD;  Location: [facility];  Service: Cardiovascular;  Laterality: N/A;  . CARDIAC  CATHETERIZATION  08-15-2011  DR BENSIMHON   FALSE POSTIVE STRESS TEST--  MINIMAL NON-BSTRUCTIVE CAD ,  mLAD 30%, LM congenitally absent (there was seprate ostia for the lad & the lcfx)/   NORMAL LVF  . CARPAL TUNNEL RELEASE Right 1990  . COLONOSCOPY    . KNEE ARTHROSCOPY Right YRS AGO  . LAPAROSCOPIC INGUINAL HERNIA REPAIR Right 09-05-2011  . POLYPECTOMY    . PROSTATE BIOPSY N/A 10/06/2019   Procedure: PROSTATE BIOPSY;  Surgeon: Royston Cowper, MD;  Location: [facility];  Service: Urology;  Laterality: N/A;  . SHOULDER ARTHROSCOPY WITH SUBACROMIAL DECOMPRESSION AND BICEP TENDON REPAIR Left 05/12/2014   Procedure: LEFT ARTHROSCOPY SHOULDER WITH EXAM UNDER ANESTHESIA,SUBACROMIAL DECOMPRESSION DISTAL CLAVICAL RESECTION LABIAL REPAIR VERSES DEBRIDEMENT;  Surgeon: Sydnee Cabal, MD;  Location: [place];  Service: Orthopedics;  Laterality: Left;  . TRANSTHORACIC ECHOCARDIOGRAM  08-19-2011   MILD LVH/  EF [id number]  GRADE I DIASTOLIC DYSFUNCTION    Current Outpatient Medications  Medication Sig Dispense Refill  . atorvastatin (LIPITOR) 20 MG tablet TAKE 1 TABLET BY MOUTH  DAILY 90 tablet 3  . diltiazem (CARDIZEM) 30 MG tablet Take 1 tablet (30 mg total) by mouth as directed. Take one tablet by mouth every 4 hours AS NEEDED for A-fib HR > 100 as long as BP > 100. 45 tablet 1  . ELIQUIS 5 MG TABS tablet TAKE 1 TABLET TWICE DAILY 60 tablet 10  .  metoprolol succinate (TOPROL-XL) 25 MG 24 hr tablet TAKE 1 TABLET BY MOUTH AT  BEDTIME 90 tablet 3  . metoprolol tartrate (LOPRESSOR) 25 MG tablet Take 0.5 tablets (12.5 mg total) by mouth every 6 (six) hours as needed (breakthrough afib). 180 tablet 3  . potassium chloride SA (KLOR-CON) 20 MEQ tablet TAKE 1 TABLET BY MOUTH  TWICE DAILY 180 tablet 3   No current facility-administered medications for this visit.    Allergies:   Shellfish allergy and Sulfa antibiotics   Social History:  The patient  reports that he has never smoked. He has  never used smokeless tobacco. He reports that he does not drink alcohol or use drugs.   Family History:  The patient's  family history includes COPD in his father; Cancer in his father; Diabetes in his mother; Heart disease in his father.   ROS:  Please see the history of present illness.   All other systems are personally reviewed and negative.    Exam:    Vital Signs:  There were no vitals taken for this visit.  Well sounding and appearing, alert and conversant, regular work of breathing,  good skin color Eyes- anicteric, neuro- grossly intact, skin- no apparent rash or lesions or cyanosis, mouth- oral mucosa is pink  Labs/Other Tests and Data Reviewed:    Recent Labs: 01/12/2019: Hemoglobin 15.8; Platelets 205 01/19/2019: BUN 17; Magnesium 2.1; Potassium 3.6; Sodium 137 09/21/2019: Creatinine, Ser 1.00   Wt Readings from Last 3 Encounters:  10/28/19 200 lb (90.7 kg)  10/06/19 200 lb (90.7 kg)  07/25/19 195 lb (88.5 kg)       ASSESSMENT & PLAN:    1.  Paroxysmal atrial fibrillation Doing well post ablation without AF recurrence off AAD therapy  No procedure related complications Continue Eliquis for CHADS2VASC of 2  2.  HTN Stable No change required today   Follow-up:  With me in 6 months    Patient Risk:  after full review of this patients clinical status, I feel that they are at moderate risk at this time.  Today, I have spent 15 minutes with the patient with telehealth technology discussing arrhythmia management .    Army Fossa, MD  01/06/2020 2:51 PM     [place] [id number] [phone] (office) [phone] (fax)

## 2020-03-09 ENCOUNTER — Telehealth (HOSPITAL_COMMUNITY): Payer: Self-pay | Admitting: *Deleted

## 2020-03-09 NOTE — Telephone Encounter (Signed)
Will wait for official clearance form and go ahead and remove from pre-op pool.

## 2020-03-09 NOTE — Telephone Encounter (Addendum)
Patient having seed implants for prostate cancer on 4/28 needs direction on when to stop eliquis. Patient follows with Dr. Rayann Heman and Dr. Marlou Porch.  Please advise and he can be reached at [phone]

## 2020-03-09 NOTE — Telephone Encounter (Signed)
Advised patient he will need to have the urologist office send over a clearance request form for procedure.

## 2020-03-09 NOTE — Telephone Encounter (Signed)
I s/w the pt to see who will be doing his procedure. Pt states he will be having his procedure for seed implants for prostate cancer on 03/21/20 with Dr. Tresa Endo with [facility] # [phone]. I have left a message for Dr. Shann Medal office to please call back as will need information in regards to his procedure. Left our office # [phone].

## 2020-03-12 NOTE — Telephone Encounter (Signed)
I tried to reach out to Dr. Melchor Amour office in regards to needing to get surgery information sent to our office.

## 2020-03-15 NOTE — Telephone Encounter (Signed)
   [facility] Pre-operative Risk Assessment    Request for surgical clearance:  1. What type of surgery is being performed? SEED IMPLANT FOR PROSTATE CANCER   2. When is this surgery scheduled? 03/21/20   3. What type of clearance is required (medical clearance vs. Pharmacy clearance to hold med vs. Both)? BOTH  4. Are there any medications that need to be held prior to surgery and how long? ELIQUIS X 3 DAYS PRIOR TO PROCEDURE   5. Practice name and name of physician performing surgery? [facility]; DR. Malena Peer DAVIS   6. What is your office phone number [phone]    7.   What is your office fax number [phone]  8.   Anesthesia type (None, local, MAC, general) ? GENERAL   Bradley Gilbert 03/15/2020, 10:30 AM  _________________________________________________________________   (provider comments below)

## 2020-03-15 NOTE — Telephone Encounter (Signed)
Follow up  Dr. Leroy Libman

## 2020-03-15 NOTE — Telephone Encounter (Signed)
I have tried x 3 to reach Dr. Shann Medal office in hopes to obtain complete information needed for pt's upcoming procedure. I have a left a very detailed message again today with the information that I am needing and that they may call our office with the information or may send a fax with complete information, needing procedure, any meds to be held and how long, anesthesia being used if any, fax number, surgeon (Dr. Tresa Endo).   I then reached out to the pt and updated him as that we have tried x 3 to reach Dr. Shann Medal office to obtain complete procedure information for the cardiologist to review. Pt also gave me the name of the radiologist Dr. Tobey Bride # [phone] or Dr. Rolland Porter nurse [place] # [phone]. I assured the pt that I will try reaching out to Dr. Tharon Aquas as well.

## 2020-03-15 NOTE — Telephone Encounter (Signed)
Attempted to call back Dr. Leroy Libman.  Left voicemail to call back.

## 2020-03-15 NOTE — Telephone Encounter (Signed)
Left a detailed message for Dr. Rolland Porter nurse [place]. I reached out in hopes that she may have the complete procedure information that our cardiologist is needing for the pt's procedure. I have left message to please call our office [phone] and ask for the pre op team or fax clearance to [phone].

## 2020-03-15 NOTE — Telephone Encounter (Signed)
   Primary Cardiologist: Candee Furbish, MD  Chart reviewed as part of pre-operative protocol coverage. Given past medical history and time since last visit, based on ACC/AHA guidelines, Bradley Gilbert would be at acceptable risk for the planned procedure without further cardiovascular testing.   Patient with diagnosis of afib on Eliquis for anticoagulation.    Procedure: SEED IMPLANT FOR PROSTATE CANCER Date of procedure: 03/21/20  CHADS2-VASc score of  3 (HTN, AGE, CAD)  CrCl 73 ml/min  Per office protocol, patient can hold Eliquis for 3 days prior to procedure.  I will route this recommendation to the requesting party via Epic fax function and remove from pre-op pool.  Please call with questions.  Jossie Ng. Lareina Espino NP-C    03/15/2020, 12:12 PM [address] Office [phone] Fax [phone]

## 2020-03-15 NOTE — Telephone Encounter (Signed)
Follow up   Dr. Leroy Libman would like a return call he has a question about this patient's clearance.

## 2020-03-15 NOTE — Telephone Encounter (Signed)
Patient with diagnosis of afib on Eliquis for anticoagulation.    Procedure: SEED IMPLANT FOR PROSTATE CANCER  Date of procedure: 03/21/20  CHADS2-VASc score of  3 (HTN, AGE, CAD)  CrCl 73 ml/min  Per office protocol, patient can hold Eliquis for 3 days prior to procedure.

## 2020-03-16 NOTE — Telephone Encounter (Signed)
Dr. Leroy Libman is returning call in regards to clearance.

## 2020-03-16 NOTE — Telephone Encounter (Signed)
Called Dr. Leroy Libman regarding Jamse Mead. Deetz and his prostate seed implant.  Clearance completed and sent yesterday 03/15/2020.  Recommendations for holding his Eliquis were included.  Dr. Leroy Libman is not aware preoperative  cardiac evaluation has been completed.  He had no further concerns.  He will proceed with the procedure as scheduled.

## 2020-06-06 ENCOUNTER — Encounter: Payer: Self-pay | Admitting: Family Medicine

## 2020-06-06 ENCOUNTER — Other Ambulatory Visit: Payer: Self-pay

## 2020-06-06 ENCOUNTER — Ambulatory Visit: Payer: Medicare Other | Admitting: Family Medicine

## 2020-06-06 DIAGNOSIS — S40262A Insect bite (nonvenomous) of left shoulder, initial encounter: Secondary | ICD-10-CM

## 2020-06-06 DIAGNOSIS — W57XXXA Bitten or stung by nonvenomous insect and other nonvenomous arthropods, initial encounter: Secondary | ICD-10-CM | POA: Diagnosis not present

## 2020-06-06 DIAGNOSIS — C61 Malignant neoplasm of prostate: Secondary | ICD-10-CM | POA: Diagnosis not present

## 2020-06-06 MED ORDER — DOXYCYCLINE HYCLATE 100 MG PO CAPS: 100.0000 mg | ORAL_CAPSULE | Freq: Two times a day (BID) | ORAL | 1 refills | Status: DC

## 2020-06-06 NOTE — Progress Notes (Signed)
Office Visit Note   Patient: Bradley Gilbert           Date of Birth: September 23, 1951           MRN: [id number] Visit Date: 06/06/2020 Requested by: Josetta Huddle, MD [address] [place],  [place] [address] PCP: Josetta Huddle, MD  Subjective: Chief Complaint  Patient presents with  . tickbite anterior shoulder    HPI: He is here with a tick bite.  He found a tick on the left anterior shoulder yesterday.  It was a small tick but it was engorged.  He has a history of [facility] spotted fever several times in the past.  He is not having any worrisome symptoms right now.  No fevers, chills.  He also was stung by hornets in other areas recently.  He also wants to transfer here for primary care.  He is no longer confident in his previous PCP.  He is being treated for prostate cancer by urology.  He has a history of atrial fibrillation in the past.               ROS:   All other systems were reviewed and are negative.  Objective: Vital Signs: There were no vitals taken for this visit.  Physical Exam:  General:  Alert and oriented, in no acute distress. Pulm:  Breathing unlabored. Psy:  Normal mood, congruent affect. Skin: There is evidence of a tick bite on the anterior left shoulder.  There is no surrounding erythema or swelling, no drainage.   Imaging: No results found.  Assessment & Plan: 1.  Tick bite left shoulder -We will treat with doxycycline.  He will contact me if symptoms worsen and we will order labs.  2.  Prostate cancer, managed by urology.  3.  Establishing primary care -We will request records from previous PCP.     Procedures: No procedures performed  No notes on file     PMFS History: Patient Active Problem List   Diagnosis Date Noted  . Prostate cancer ([place]) 06/06/2020  . Paroxysmal atrial fibrillation ([place]) 09/07/2018  . Atrial fibrillation with rapid ventricular response ([place]) 07/04/2018  . Essential hypertension 07/04/2018  .  Elevated troponin 04/28/2017  . Vertigo 04/28/2017  . Cough 01/01/2016  . Wheezing 01/01/2016  . Abnormal breath sounds 01/01/2016  . Tick bite of lower leg 06/12/2015  . Skin lesion of right arm 06/12/2015  . Routine general medical examination at a health care facility 09/12/2014  . CAD (coronary artery disease) 08/20/2011  . Hyperlipidemia 08/20/2011  . OSTEOARTHRITIS, KNEE 01/03/2011   Past Medical History:  Diagnosis Date  . Allergic rhinitis   . Complication of anesthesia    hard time waking up   . Coronary artery disease    Cath 08/15/11 with 30% mid LAD, o/w no CAD  . Hearing loss   . History of colon polyps    BENGIN  . Left rotator cuff tear   . OA (osteoarthritis)    KNEES AND LEFT SHOULDER  . Paroxysmal atrial fibrillation (HCC)   . PONV (postoperative nausea and vomiting)     Family History  Problem Relation Age of Onset  . Diabetes Mother   . Heart disease Father   . Cancer Father        throat  . COPD Father        throat  . Colon cancer Neg Hx   . Prostate cancer Neg Hx   . Rectal  cancer Neg Hx   . Stomach cancer Neg Hx     Past Surgical History:  Procedure Laterality Date  . ATRIAL FIBRILLATION ABLATION N/A 01/18/2019   Procedure: ATRIAL FIBRILLATION ABLATION;  Surgeon: Thompson Grayer, MD;  Location: [facility];  Service: Cardiovascular;  Laterality: N/A;  . 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 CARPAL TUNNEL RELEASE Right 1990  . COLONOSCOPY    . KNEE ARTHROSCOPY Right YRS AGO  . LAPAROSCOPIC INGUINAL HERNIA REPAIR Right 09-05-2011  . POLYPECTOMY    . PROSTATE BIOPSY N/A 10/06/2019   Procedure: PROSTATE BIOPSY;  Surgeon: Royston Cowper, MD;  Location: [facility];  Service: Urology;  Laterality: N/A;  . SHOULDER ARTHROSCOPY WITH SUBACROMIAL DECOMPRESSION AND BICEP TENDON  REPAIR Left 05/12/2014   Procedure: LEFT ARTHROSCOPY SHOULDER WITH EXAM UNDER ANESTHESIA,SUBACROMIAL DECOMPRESSION DISTAL CLAVICAL RESECTION LABIAL REPAIR VERSES DEBRIDEMENT;  Surgeon: Sydnee Cabal, MD;  Location: [place];  Service: Orthopedics;  Laterality: Left;  . TRANSTHORACIC ECHOCARDIOGRAM  08-19-2011   MILD LVH/  EF 02-58%/  GRADE I DIASTOLIC DYSFUNCTION   Social History   Occupational History  . Occupation: Diet Therapist, music: [employer]  Tobacco Use  . Smoking status: Never Smoker  . Smokeless tobacco: Never Used  Vaping Use  . Vaping Use: Never used  Substance and Sexual Activity  . Alcohol use: No    Alcohol/week: 0.0 standard drinks  . Drug use: No  . Sexual activity: Not on file

## 2020-07-16 ENCOUNTER — Other Ambulatory Visit: Payer: Self-pay

## 2020-07-16 ENCOUNTER — Telehealth: Payer: Self-pay | Admitting: *Deleted

## 2020-07-16 ENCOUNTER — Encounter: Payer: Self-pay | Admitting: Internal Medicine

## 2020-07-16 ENCOUNTER — Telehealth (INDEPENDENT_AMBULATORY_CARE_PROVIDER_SITE_OTHER): Payer: Medicare Other | Admitting: Internal Medicine

## 2020-07-16 VITALS — BP 125/62 | HR 72 | Ht 71.0 in | Wt 200.0 lb

## 2020-07-16 DIAGNOSIS — E785 Hyperlipidemia, unspecified: Secondary | ICD-10-CM

## 2020-07-16 DIAGNOSIS — Z79899 Other long term (current) drug therapy: Secondary | ICD-10-CM

## 2020-07-16 DIAGNOSIS — I480 Paroxysmal atrial fibrillation: Secondary | ICD-10-CM | POA: Diagnosis not present

## 2020-07-16 DIAGNOSIS — I10 Essential (primary) hypertension: Secondary | ICD-10-CM | POA: Diagnosis not present

## 2020-07-16 NOTE — Telephone Encounter (Signed)
-----   Message from Thompson Grayer, MD sent at 07/16/2020  3:52 PM EDT ----- Reduce kdur 14meq daily Stop magnesium supplement  Reduce toprol to 12.5mg  daily x 2 months then discontinue  Return in 2 weeks for bmet and mg levels   Return to see me in 6 months

## 2020-07-16 NOTE — Progress Notes (Signed)
PCP: Josetta Huddle, MD   Primary EP: Dr Rayann Heman  [name] is a 69 y.o. male who presents today for routine electrophysiology virtual visit followup.  Since last being seen in our clinic, the patient reports doing very well.  Today, he denies symptoms of palpitations, chest pain, shortness of breath,  lower extremity edema, dizziness, presyncope, or syncope.  He has prostate cancer and is s/p seeds and XRT.  He is also hormonal therapy.  The patient is otherwise without complaint today.   Past Medical History:  Diagnosis Date  . Allergic rhinitis   . Complication of anesthesia    hard time waking up   . Coronary artery disease    Cath 08/15/11 with 30% mid LAD, o/w no CAD  . Hearing loss   . History of colon polyps    BENGIN  . Left rotator cuff tear   . OA (osteoarthritis)    KNEES AND LEFT SHOULDER  . Paroxysmal atrial fibrillation (HCC)   . PONV (postoperative nausea and vomiting)    Past Surgical History:  Procedure Laterality Date  . ATRIAL FIBRILLATION ABLATION N/A 01/18/2019   Procedure: ATRIAL FIBRILLATION ABLATION;  Surgeon: Thompson Grayer, MD;  Location: [facility];  Service: Cardiovascular;  Laterality: N/A;  . 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 CARPAL TUNNEL RELEASE Right 1990  . COLONOSCOPY    . KNEE ARTHROSCOPY Right YRS AGO  . LAPAROSCOPIC INGUINAL HERNIA REPAIR Right 09-05-2011  . POLYPECTOMY    . PROSTATE BIOPSY N/A 10/06/2019   Procedure: PROSTATE BIOPSY;  Surgeon: Royston Cowper, MD;  Location: [facility];  Service: Urology;  Laterality: N/A;  . SHOULDER ARTHROSCOPY WITH SUBACROMIAL DECOMPRESSION AND BICEP TENDON REPAIR Left 05/12/2014   Procedure: LEFT ARTHROSCOPY SHOULDER WITH EXAM UNDER ANESTHESIA,SUBACROMIAL DECOMPRESSION DISTAL CLAVICAL RESECTION LABIAL REPAIR VERSES  DEBRIDEMENT;  Surgeon: Sydnee Cabal, MD;  Location: [facility];  Service: Orthopedics;  Laterality: Left;  . TRANSTHORACIC ECHOCARDIOGRAM  08-19-2011   MILD LVH/  EF 93-81%/  GRADE I DIASTOLIC DYSFUNCTION    ROS- all systems are reviewed and negatives except as per HPI above  Current Outpatient Medications  Medication Sig Dispense Refill  . atorvastatin (LIPITOR) 20 MG tablet TAKE 1 TABLET BY MOUTH  DAILY 90 tablet 3  . B Complex Vitamins (VITAMIN-B COMPLEX) TABS Take 1 tablet by mouth daily.    Marland Kitchen diltiazem (CARDIZEM) 30 MG tablet Take 1 tablet (30 mg total) by mouth as directed. Take one tablet by mouth every 4 hours AS NEEDED for A-fib HR > 100 as long as BP > 100. 45 tablet 1  . doxycycline (VIBRAMYCIN) 100 MG capsule Take 1 capsule (100 mg total) by mouth 2 (two) times daily. 28 capsule 1  . ELIQUIS 5 MG TABS tablet TAKE 1 TABLET TWICE DAILY 60 tablet 10  . GRAPE SEED EXTRACT PO Take by mouth in the morning and at bedtime.    . Lycopene 10 MG CAPS Take by mouth.    . magnesium 30 MG tablet Take 30 mg by mouth 2 (two) times daily.    . metoprolol succinate (TOPROL-XL) 25 MG 24 hr tablet TAKE 1 TABLET BY MOUTH AT  BEDTIME 90 tablet 3  . Multiple Vitamins-Minerals (MULTIVITAMIN WITH MINERALS) tablet Take 1 tablet by mouth daily.    Marland Kitchen  potassium chloride SA (KLOR-CON) 20 MEQ tablet TAKE 1 TABLET BY MOUTH  TWICE DAILY 180 tablet 3  . tamsulosin (FLOMAX) 0.4 MG CAPS capsule Take by mouth.    . metoprolol tartrate (LOPRESSOR) 25 MG tablet Take 0.5 tablets (12.5 mg total) by mouth every 6 (six) hours as needed (breakthrough afib). 180 tablet 3   No current facility-administered medications for this visit.    Physical Exam: Vitals:   07/16/20 1524  BP: 125/62  Pulse: 72  Weight: 200 lb (90.7 kg)  Height: 5\' 11"  (7.185 m)    GEN- The patient is well appearing, alert and oriented x 3 today.    Wt Readings from Last 3 Encounters:  07/16/20 200 lb (90.7 kg)  10/28/19  200 lb (90.7 kg)  10/06/19 200 lb (90.7 kg)     Assessment and Plan:  1. Paroxysmal atrial fibrillation Doing very well post ablation off AAD therapy chads2vasc score is 2.  He is on eliquis Reduce toprol to 12.5mg  daily x 2 months, then discontinue  2. HTN Stable No change required today  3 HL Continue statin  4. Electrolytes Stop magnesium Reduced Kdur 20 meq daily Return to office in 2 weeks for bmet, and mg   Risks, benefits and potential toxicities for medications prescribed and/or refilled reviewed with patient today.   Return in 6 months  Thompson Grayer MD, [facility] 07/16/2020 3:46 PM

## 2020-07-16 NOTE — Telephone Encounter (Signed)
Medication changes discussed to verify understanding.   Labs ordered and scheduled.

## 2020-07-19 ENCOUNTER — Encounter: Payer: Self-pay | Admitting: Gastroenterology

## 2020-07-24 ENCOUNTER — Encounter: Payer: Self-pay | Admitting: Physician Assistant

## 2020-07-24 ENCOUNTER — Telehealth: Payer: Self-pay | Admitting: *Deleted

## 2020-07-24 ENCOUNTER — Ambulatory Visit: Payer: Medicare Other | Admitting: Physician Assistant

## 2020-07-24 VITALS — BP 112/62 | HR 86 | Ht 71.0 in | Wt 207.1 lb

## 2020-07-24 DIAGNOSIS — Z86010 Personal history of colonic polyps: Secondary | ICD-10-CM | POA: Diagnosis not present

## 2020-07-24 DIAGNOSIS — Z7901 Long term (current) use of anticoagulants: Secondary | ICD-10-CM

## 2020-07-24 MED ORDER — SUTAB 1479-225-188 MG PO TABS: 1.0000 | ORAL_TABLET | Freq: Once | ORAL | 0 refills | Status: AC

## 2020-07-24 NOTE — Progress Notes (Signed)
Chief Complaint: Preprocedural visit for screening colonoscopy in a patient on chronic anticoagulation  HPI:    Bradley Gilbert is a 69 year old male with a past medical history as listed below including CAD status post cath in 2012 and A. fib on Eliquis (echo 04/29/2017 LVEF 60-65%, no aortic valve stenosis), known to Dr. Fuller Plan, who presents to clinic today for preprocedural visit for surveillance colonoscopy.    12/07/2014 Colonoscopy with Dr. Fuller Plan - on sessile polyp at the cecum, three sessile polyps in the transverse and sigmoid colon, diverticulosis in the sigmoid colon and grade 1 internal hemorrhoids.  Repeat recommended in 5 years.  Pathology showed tubular adenomas.    Today, the patient presents to clinic and tells me he is doing well he has no complaints at all.  He does request to do the "the tablets" for his prep.  Tells me he has had to hold his Eliquis for 2 days prior to other procedures and does fine.    Denies fever, chills, weight loss, change in bowel habits, abdominal pain, heartburn or reflux.   Past Medical History:  Diagnosis Date  . Allergic rhinitis   . Complication of anesthesia    hard time waking up   . Coronary artery disease    Cath 08/15/11 with 30% mid LAD, o/w no CAD  . Hearing loss   . History of colon polyps    BENGIN  . Left rotator cuff tear   . OA (osteoarthritis)    KNEES AND LEFT SHOULDER  . Paroxysmal atrial fibrillation (HCC)   . PONV (postoperative nausea and vomiting)     Past Surgical History:  Procedure Laterality Date  . ATRIAL FIBRILLATION ABLATION N/A 01/18/2019   Procedure: ATRIAL FIBRILLATION ABLATION;  Surgeon: Thompson Grayer, MD;  Location: [facility];  Service: Cardiovascular;  Laterality: N/A;  . 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 CARPAL TUNNEL RELEASE  Right 1990  . COLONOSCOPY    . KNEE ARTHROSCOPY Right YRS AGO  . LAPAROSCOPIC INGUINAL HERNIA REPAIR Right 09-05-2011  . POLYPECTOMY    . PROSTATE BIOPSY N/A 10/06/2019   Procedure: PROSTATE BIOPSY;  Surgeon: Royston Cowper, MD;  Location: [facility];  Service: Urology;  Laterality: N/A;  . SHOULDER ARTHROSCOPY WITH SUBACROMIAL DECOMPRESSION AND BICEP TENDON REPAIR Left 05/12/2014   Procedure: LEFT ARTHROSCOPY SHOULDER WITH EXAM UNDER ANESTHESIA,SUBACROMIAL DECOMPRESSION DISTAL CLAVICAL RESECTION LABIAL REPAIR VERSES DEBRIDEMENT;  Surgeon: Sydnee Cabal, MD;  Location: [facility];  Service: Orthopedics;  Laterality: Left;  . TRANSTHORACIC ECHOCARDIOGRAM  08-19-2011   MILD LVH/  EF 50-27%/  GRADE I DIASTOLIC DYSFUNCTION    Current Outpatient Medications  Medication Sig Dispense Refill  . atorvastatin (LIPITOR) 20 MG tablet TAKE 1 TABLET BY MOUTH  DAILY 90 tablet 3  . B Complex Vitamins (VITAMIN-B COMPLEX) TABS Take 1 tablet by mouth daily.    Marland Kitchen diltiazem (CARDIZEM) 30 MG tablet Take 1 tablet (30 mg total) by mouth as directed. Take one tablet by mouth every 4 hours AS NEEDED for A-fib HR > 100 as long as BP > 100. 45 tablet 1  . doxycycline (VIBRAMYCIN) 100 MG capsule Take 1 capsule (100 mg total) by mouth 2 (two) times daily. 28 capsule 1  . ELIQUIS 5 MG TABS tablet TAKE 1 TABLET TWICE DAILY  60 tablet 10  . GRAPE SEED EXTRACT PO Take by mouth in the morning and at bedtime.    . Lycopene 10 MG CAPS Take by mouth.    . magnesium 30 MG tablet Take 30 mg by mouth 2 (two) times daily.    . metoprolol succinate (TOPROL-XL) 25 MG 24 hr tablet TAKE 1 TABLET BY MOUTH AT  BEDTIME 90 tablet 3  . metoprolol tartrate (LOPRESSOR) 25 MG tablet Take 0.5 tablets (12.5 mg total) by mouth every 6 (six) hours as needed (breakthrough afib). 180 tablet 3  . Multiple Vitamins-Minerals (MULTIVITAMIN WITH MINERALS) tablet Take 1 tablet by mouth daily.    . potassium chloride SA (KLOR-CON) 20 MEQ  tablet TAKE 1 TABLET BY MOUTH  TWICE DAILY 180 tablet 3  . tamsulosin (FLOMAX) 0.4 MG CAPS capsule Take by mouth.     No current facility-administered medications for this visit.    Allergies as of 07/24/2020 - Review Complete 07/16/2020  Allergen Reaction Noted  . Shellfish allergy Anaphylaxis 01/23/2014  . Sulfa antibiotics Swelling 09/27/2019    Family History  Problem Relation Age of Onset  . Diabetes Mother   . Heart disease Father   . Cancer Father        throat  . COPD Father        throat  . Colon cancer Neg Hx   . Prostate cancer Neg Hx   . Rectal cancer Neg Hx   . Stomach cancer Neg Hx     Social History   Socioeconomic History  . Marital status: Married    Spouse name: Not on file  . Number of children: Not on file  . Years of education: Not on file  . Highest education level: Not on file  Occupational History  . Occupation: Diet Therapist, music: [employer]  Tobacco Use  . Smoking status: Never Smoker  . Smokeless tobacco: Never Used  Vaping Use  . Vaping Use: Never used  Substance and Sexual Activity  . Alcohol use: No    Alcohol/week: 0.0 standard drinks  . Drug use: No  . Sexual activity: Not on file  Other Topics Concern  . Not on file  Social History Narrative   Graduate of [employer].  Married - '85.  1 son - '70,  2 daughters - '74, '87;   5 grandchildren.  Hobbies - keeps horses and ponies.  Marriage - good health            Social Determinants of Health   Financial Resource Strain:   . Difficulty of Paying Living Expenses: Not on file  Food Insecurity:   . Worried About Charity fundraiser in the Last Year: Not on file  . Ran Out of Food in the Last Year: Not on file  Transportation Needs:   . Lack of Transportation (Medical): Not on file  . Lack of Transportation (Non-Medical): Not on file  Physical Activity:   . Days of Exercise per Week: Not on file  . Minutes of Exercise per Session: Not on file  Stress:     . Feeling of Stress : Not on file  Social Connections:   . Frequency of Communication with Friends and Family: Not on file  . Frequency of Social Gatherings with Friends and Family: Not on file  . Attends Religious Services: Not on file  . Active Member of Clubs or Organizations: Not on file  . Attends Archivist Meetings: Not on file  .  Marital Status: Not on file  Intimate Partner Violence:   . Fear of Current or Ex-Partner: Not on file  . Emotionally Abused: Not on file  . Physically Abused: Not on file  . Sexually Abused: Not on file    Review of Systems:    Constitutional: No weight loss, fever or chills Skin: No rash  Cardiovascular: No chest pain Respiratory: No SOB  Gastrointestinal: See HPI and otherwise negative Genitourinary: No dysuria Neurological: No headache, dizziness or syncope Musculoskeletal: No new muscle or joint pain Hematologic: No bleeding  Psychiatric: No history of depression or anxiety   Physical Exam:  Vital signs: BP 112/62 (BP Location: Right Arm, Patient Position: Sitting, Cuff Size: Normal)   Pulse 86   Ht 5\' 11"  (1.803 m)   Wt 207 lb 2 oz (94 kg)   BMI 28.89 kg/m   Constitutional:   Pleasant Caucasian male appears to be in NAD, Well developed, Well nourished, alert and cooperative Respiratory: Respirations even and unlabored. Lungs clear to auscultation bilaterally.   No wheezes, crackles, or rhonchi.  Cardiovascular: Normal S1, S2. No MRG. Regular rate and rhythm. No peripheral edema, cyanosis or pallor.  Gastrointestinal:  Soft, nondistended, nontender. No rebound or guarding. Normal bowel sounds. No appreciable masses or hepatomegaly. Psychiatric: Oriented to person, place and time. Demonstrates good judgement and reason without abnormal affect or behaviors.  No recent labs/imaging.  Assessment: 1.  History of adenomatous polyps: Last colonoscopy in 2016, recommendations for repeat in 5 years 2.  Chronic anticoagulation for  A. fib: With Eliquis  Plan: 1.  Already scheduled for colonoscopy in the [facility] with Dr. Fuller Plan on October 25.  He has had both of his Covid vaccines.  We reviewed risks, benefits, limitations and alternatives and patient agrees to proceed. 2.  Patient requests sutabs for his prep. 3.  Patient to follow in clinic per recommendations from Dr. Fuller Plan after time of procedure.  Ellouise Newer, PA-C [facility] 07/24/2020, 1:21 PM  Cc: Josetta Huddle, MD

## 2020-07-24 NOTE — Telephone Encounter (Signed)
[facility] Pre-operative Risk Assessment     Request for surgical clearance:     Endoscopy Procedure  What type of surgery is being performed?     Colonoscopy  When is this surgery scheduled?     Monday 09/17/20  What type of clearance is required ?   Pharmacy  Are there any medications that need to be held prior to surgery and how long? Eliquis 2 days  Practice name and name of physician performing surgery?      [facility]  What is your office phone and fax number?      Phone- [phone]  [phone]  Anesthesia type (None, local, MAC, general) ?       MAC

## 2020-07-24 NOTE — Telephone Encounter (Signed)
   Primary Cardiologist: Candee Furbish, MD / EP - Dr. Rayann Heman  Chart reviewed as part of pre-operative protocol coverage. Patient was contacted 07/24/2020 in reference to pre-operative risk assessment for pending surgery as outlined below.  Bradley Gilbert was last seen on 07/16/20 by Dr. Rayann Heman and was felt to be doing well. History outlined with PAF, prostate CA, mild CAD in 2012, OA, HTN, HLD. Has history of ablation in 2020. NST 2019 was normal. The patient reports today by phone he is feeling well without any cardiac concerns. Therefore, based on ACC/AHA guidelines, the patient would be at acceptable risk for the planned procedure without further cardiovascular testing.   The patient was advised that if he develops new symptoms prior to surgery to contact our office to arrange for a follow-up visit, and he verbalized understanding.  Will route to pharm for input on Eliquis.  Charlie Pitter, PA-C 07/24/2020, 2:08 PM

## 2020-07-24 NOTE — Progress Notes (Signed)
Reviewed and agree with management plan.  Gaylen Pereira T. Leonor Darnell, MD FACG [facility]  

## 2020-07-24 NOTE — Patient Instructions (Signed)
If you are age 71 or older, your body mass index should be between 23-30. Your Body mass index is 28.89 kg/m. If this is out of the aforementioned range listed, please consider follow up with your Primary Care Provider.  If you are age 10 or younger, your body mass index should be between 19-25. Your Body mass index is 28.89 kg/m. If this is out of the aformentioned range listed, please consider follow up with your Primary Care Provider.   You have been scheduled for a colonoscopy. Please follow written instructions given to you at your visit today.  Please pick up your prep supplies at the pharmacy within the next 1-3 days. If you use inhalers (even only as needed), please bring them with you on the day of your procedure.  Due to recent changes in healthcare laws, you may see the results of your imaging and laboratory studies on MyChart before your provider has had a chance to review them.  We understand that in some cases there may be results that are confusing or concerning to you. Not all laboratory results come back in the same time frame and the provider may be waiting for multiple results in order to interpret others.  Please give [place] 48 hours in order for your provider to thoroughly review all the results before contacting the office for clarification of your results.

## 2020-07-25 NOTE — Telephone Encounter (Signed)
Patient with diagnosis of afib on Eliquis for anticoagulation.    Procedure: Colonoscopy Date of procedure: 09/17/20  CHADS2-VASc score of  3 (HTN, AGE, CAD)  CrCl 73 ml/min  Per office protocol, patient can hold Eliquis for 2 days prior to procedure.

## 2020-07-25 NOTE — Telephone Encounter (Signed)
   Primary Cardiologist: Candee Furbish, MD  Chart revisited as part of pre-operative protocol coverage. Given past medical history and time since last visit and discussion with patient, based on ACC/AHA guidelines, Bradley Gilbert would be at acceptable risk for the planned procedure without further cardiovascular testing.   The patient was advised that if he develops new symptoms prior to surgery to contact our office to arrange for a follow-up visit, and he verbalized understanding.  Per office protocol, patient can hold Eliquis for 2 days prior to procedure.    I will route this recommendation to the requesting party via Epic fax function and remove from pre-op pool.  Please call with questions.  Charlie Pitter, PA-C 07/25/2020, 8:14 AM

## 2020-07-31 ENCOUNTER — Other Ambulatory Visit: Payer: Self-pay

## 2020-07-31 ENCOUNTER — Other Ambulatory Visit: Payer: Medicare Other | Admitting: *Deleted

## 2020-07-31 DIAGNOSIS — I480 Paroxysmal atrial fibrillation: Secondary | ICD-10-CM

## 2020-07-31 DIAGNOSIS — Z79899 Other long term (current) drug therapy: Secondary | ICD-10-CM

## 2020-07-31 LAB — BASIC METABOLIC PANEL
BUN/Creatinine Ratio: 15 (ref 10–24)
BUN: 14 mg/dL (ref 8–27)
CO2: 21 mmol/L (ref 20–29)
Calcium: 9.5 mg/dL (ref 8.6–10.2)
Chloride: 102 mmol/L (ref 96–106)
Creatinine, Ser: 0.92 mg/dL (ref 0.76–1.27)
GFR calc Af Amer: 97 mL/min/{1.73_m2} (ref >59)
GFR calc non Af Amer: 84 mL/min/{1.73_m2} (ref >59)
Glucose: 107 mg/dL — ABNORMAL HIGH (ref 65–99)
Potassium: 4 mmol/L (ref 3.5–5.2)
Sodium: 138 mmol/L (ref 134–144)

## 2020-07-31 LAB — MAGNESIUM: Magnesium: 1.9 mg/dL (ref 1.6–2.3)

## 2020-08-02 ENCOUNTER — Other Ambulatory Visit (HOSPITAL_COMMUNITY): Payer: Self-pay | Admitting: *Deleted

## 2020-08-02 ENCOUNTER — Telehealth: Payer: Self-pay | Admitting: Family Medicine

## 2020-08-02 MED ORDER — APIXABAN 5 MG PO TABS: 5.0000 mg | ORAL_TABLET | Freq: Two times a day (BID) | ORAL | 3 refills | Status: DC

## 2020-08-02 MED ORDER — APIXABAN 5 MG PO TABS: 5.0000 mg | ORAL_TABLET | Freq: Two times a day (BID) | ORAL | 10 refills | Status: DC

## 2020-08-02 MED ORDER — METOPROLOL SUCCINATE ER 25 MG PO TB24: 25.0000 mg | ORAL_TABLET | Freq: Every day | ORAL | 3 refills | Status: DC

## 2020-08-02 MED ORDER — TAMSULOSIN HCL 0.4 MG PO CAPS: 0.4000 mg | ORAL_CAPSULE | Freq: Every day | ORAL | 3 refills | Status: DC

## 2020-08-02 NOTE — Telephone Encounter (Signed)
Eliquis 5mg  refill request received. Patient is 69 years old, weight-94kg, Crea-0.92 on 07/31/2020, Diagnosis-Afib, and last seen by Dr. Rayann Heman on 07/16/2020. Dose is appropriate based on dosing criteria. Will send in refill to requested pharmacy.

## 2020-08-02 NOTE — Telephone Encounter (Signed)
Patient called.   He is requesting a refill on his flomax and eliquis be sent to [facility]  Call back: [phone]

## 2020-08-02 NOTE — Addendum Note (Signed)
Addended by: Hortencia Pilar on: 08/02/2020 10:59 AM   Modules accepted: Orders

## 2020-08-02 NOTE — Telephone Encounter (Signed)
Please advise 

## 2020-08-02 NOTE — Telephone Encounter (Signed)
Pt's medication was sent to pt's pharmacy as requested. Confirmation received.  °

## 2020-08-02 NOTE — Telephone Encounter (Signed)
Eliquis 

## 2020-08-02 NOTE — Telephone Encounter (Signed)
Pt called needing refills of Eliquis and metoprolol succinate sent to [facility]. Pt follows with Dr. Rayann Heman please address.

## 2020-08-02 NOTE — Telephone Encounter (Signed)
I called and advised the patient both meds were sent in.

## 2020-08-02 NOTE — Telephone Encounter (Signed)
Done

## 2020-08-02 NOTE — Telephone Encounter (Signed)
Sent!

## 2020-08-10 ENCOUNTER — Other Ambulatory Visit: Payer: Self-pay

## 2020-08-10 ENCOUNTER — Ambulatory Visit (INDEPENDENT_AMBULATORY_CARE_PROVIDER_SITE_OTHER): Payer: Medicare Other | Admitting: Family Medicine

## 2020-08-10 ENCOUNTER — Encounter: Payer: Self-pay | Admitting: Family Medicine

## 2020-08-10 VITALS — BP 124/71 | HR 75 | Ht 70.0 in | Wt 204.8 lb

## 2020-08-10 DIAGNOSIS — I10 Essential (primary) hypertension: Secondary | ICD-10-CM

## 2020-08-10 DIAGNOSIS — E785 Hyperlipidemia, unspecified: Secondary | ICD-10-CM

## 2020-08-10 DIAGNOSIS — I480 Paroxysmal atrial fibrillation: Secondary | ICD-10-CM

## 2020-08-10 DIAGNOSIS — Z0000 Encounter for general adult medical examination without abnormal findings: Secondary | ICD-10-CM

## 2020-08-10 DIAGNOSIS — I2510 Atherosclerotic heart disease of native coronary artery without angina pectoris: Secondary | ICD-10-CM | POA: Diagnosis not present

## 2020-08-10 DIAGNOSIS — C61 Malignant neoplasm of prostate: Secondary | ICD-10-CM

## 2020-08-10 DIAGNOSIS — R5383 Other fatigue: Secondary | ICD-10-CM

## 2020-08-10 NOTE — Progress Notes (Signed)
Office Visit Note   Patient: Bradley Gilbert           Date of Birth: [birth year]           MRN: [id number] Visit Date: 08/10/2020 Requested by: Eunice Blase, MD [address] [place],  [place] [address] PCP: Eunice Blase, MD  Subjective: Chief Complaint  Patient presents with  . Medicare Wellness    HPI: He is here for annual wellness exam.  Feeling well overall.  Hormone therapy for prostate cancer causes hot flashes and fatigue, making it difficult to sleep sometimes.  He is optimistic about his treatment and feels that God is curing him.  Blood pressure has been well controlled.  He has not had any issues with atrial fibrillation since having ablation.  He is scheduled for dermatology visit with Dr. Nevada Crane next week.  He will be due for colonoscopy in the next year or so.  He is up-to-date on immunizations.  He is up-to-date on eye exams and dental exams.                ROS:   All other systems were reviewed and are negative.  Objective: Vital Signs: BP 137/73   Pulse 76   Ht 5\' 10"  (1.778 m)   Wt 204 lb 12.8 oz (92.9 kg)   BMI 29.39 kg/m   Physical Exam:  General:  Alert and oriented, in no acute distress. Pulm:  Breathing unlabored. Psy:  Normal mood, congruent affect. Skin: No suspicious lesions.  He has multiple benign-appearing lesions. HEENT:  [place]/AT, PERRLA, EOM Full, no nystagmus.  Funduscopic examination within normal limits.  No conjunctival erythema.  Tympanic membranes are pearly gray with normal landmarks.  External ear canals are normal.  Nasal passages are clear.  Oropharynx is clear.  No significant lymphadenopathy.  No thyromegaly or nodules.  2+ carotid pulses without bruits. CV: Regular rate and rhythm without murmurs, rubs, or gallops.  No peripheral edema.  2+ radial and posterior tibial pulses. Lungs: Clear to auscultation throughout with no wheezing or areas of consolidation. Abd: Bowel sounds are active, no hepatosplenomegaly or masses.   Soft and nontender.  No audible bruits.  No evidence of ascites.   Imaging: No results found.  Assessment & Plan: 1.  Wellness examination -Labs to evaluate.  2.  Hypertension, adequately controlled -Refills when needed.  3.  Prostate cancer, managed by urology.  4.  Hyperlipidemia, tolerating Lipitor.     Procedures: No procedures performed  No notes on file     PMFS History: Patient Active Problem List   Diagnosis Date Noted  . Prostate cancer ([facility]) 06/06/2020  . Paroxysmal atrial fibrillation ([facility]) 09/07/2018  . Atrial fibrillation with rapid ventricular response ([facility]) 07/04/2018  . Essential hypertension 07/04/2018  . Elevated troponin 04/28/2017  . Vertigo 04/28/2017  . Cough 01/01/2016  . Wheezing 01/01/2016  . Abnormal breath sounds 01/01/2016  . Tick bite of lower leg 06/12/2015  . Skin lesion of right arm 06/12/2015  . Routine general medical examination at a health care facility 09/12/2014  . CAD (coronary artery disease) 08/20/2011  . Hyperlipidemia 08/20/2011  . OSTEOARTHRITIS, KNEE 01/03/2011   Past Medical History:  Diagnosis Date  . Allergic rhinitis   . Complication of anesthesia    hard time waking up   . Coronary artery disease    Cath 08/15/11 with 30% mid LAD, o/w no CAD  . Hearing loss   . History of colon polyps    BENGIN  .  Left rotator cuff tear   . OA (osteoarthritis)    KNEES AND LEFT SHOULDER  . Paroxysmal atrial fibrillation ([facility])   . PONV (postoperative nausea and vomiting)     Family History  Problem Relation Age of Onset  . Diabetes Mother   . Heart disease Father   . Cancer Father        throat  . COPD Father        throat  . Colon cancer Neg Hx   . Prostate cancer Neg Hx   . Rectal cancer Neg Hx   . Stomach cancer Neg Hx     Past Surgical History:  Procedure Laterality Date  . ATRIAL FIBRILLATION ABLATION N/A 01/18/2019   Procedure: ATRIAL FIBRILLATION ABLATION;  Surgeon: Thompson Grayer, MD;  Location: [facility];  Service: Cardiovascular;  Laterality: N/A;  . 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 CARPAL TUNNEL RELEASE Right 1990  . COLONOSCOPY    . KNEE ARTHROSCOPY Right YRS AGO  . LAPAROSCOPIC INGUINAL HERNIA REPAIR Right 09-05-2011  . POLYPECTOMY    . PROSTATE BIOPSY N/A 10/06/2019   Procedure: PROSTATE BIOPSY;  Surgeon: Royston Cowper, MD;  Location: [facility];  Service: Urology;  Laterality: N/A;  . SHOULDER ARTHROSCOPY WITH SUBACROMIAL DECOMPRESSION AND BICEP TENDON REPAIR Left 05/12/2014   Procedure: LEFT ARTHROSCOPY SHOULDER WITH EXAM UNDER ANESTHESIA,SUBACROMIAL DECOMPRESSION DISTAL CLAVICAL RESECTION LABIAL REPAIR VERSES DEBRIDEMENT;  Surgeon: Sydnee Cabal, MD;  Location: [facility];  Service: Orthopedics;  Laterality: Left;  . TRANSTHORACIC ECHOCARDIOGRAM  08-19-2011   MILD LVH/  EF 58-30%/  GRADE I DIASTOLIC DYSFUNCTION   Social History   Occupational History  . Occupation: Diet Therapist, music: [employer]  Tobacco Use  . Smoking status: Never Smoker  . Smokeless tobacco: Never Used  Vaping Use  . Vaping Use: Never used  Substance and Sexual Activity  . Alcohol use: No    Alcohol/week: 0.0 standard drinks  . Drug use: No  . Sexual activity: Not on file

## 2020-08-11 LAB — COMPREHENSIVE METABOLIC PANEL
AG Ratio: 2 (calc) (ref 1.0–2.5)
ALT: 41 U/L (ref 9–46)
AST: 29 U/L (ref 10–35)
Albumin: 4.4 g/dL (ref 3.6–5.1)
Alkaline phosphatase (APISO): 50 U/L (ref 35–144)
BUN: 22 mg/dL (ref 7–25)
CO2: 24 mmol/L (ref 20–32)
Calcium: 9.5 mg/dL (ref 8.6–10.3)
Chloride: 102 mmol/L (ref 98–110)
Creat: 1 mg/dL (ref 0.70–1.18)
Globulin: 2.2 g/dL (calc) (ref 1.9–3.7)
Glucose, Bld: 126 mg/dL — ABNORMAL HIGH (ref 65–99)
Potassium: 4.4 mmol/L (ref 3.5–5.3)
Sodium: 138 mmol/L (ref 135–146)
Total Bilirubin: 0.6 mg/dL (ref 0.2–1.2)
Total Protein: 6.6 g/dL (ref 6.1–8.1)

## 2020-08-11 LAB — LIPID PANEL
Cholesterol: 130 mg/dL (ref <200)
HDL: 49 mg/dL (ref >=40)
LDL Cholesterol (Calc): 45 mg/dL (calc)
Non-HDL Cholesterol (Calc): 81 mg/dL (calc) (ref <130)
Total CHOL/HDL Ratio: 2.7 (calc) (ref <5.0)
Triglycerides: 341 mg/dL — ABNORMAL HIGH (ref <150)

## 2020-08-11 LAB — CBC WITH DIFFERENTIAL/PLATELET
Absolute Monocytes: 510 cells/uL (ref 200–950)
Basophils Absolute: 40 cells/uL (ref 0–200)
Basophils Relative: 0.8 %
Eosinophils Absolute: 280 cells/uL (ref 15–500)
Eosinophils Relative: 5.6 %
HCT: 38.8 % (ref 38.5–50.0)
Hemoglobin: 13.4 g/dL (ref 13.2–17.1)
Lymphs Abs: 1050 cells/uL (ref 850–3900)
MCH: 33.2 pg — ABNORMAL HIGH (ref 27.0–33.0)
MCHC: 34.5 g/dL (ref 32.0–36.0)
MCV: 96 fL (ref 80.0–100.0)
MPV: 10.4 fL (ref 7.5–12.5)
Monocytes Relative: 10.2 %
Neutro Abs: 3120 cells/uL (ref 1500–7800)
Neutrophils Relative %: 62.4 %
Platelets: 180 10*3/uL (ref 140–400)
RBC: 4.04 10*6/uL — ABNORMAL LOW (ref 4.20–5.80)
RDW: 12 % (ref 11.0–15.0)
Total Lymphocyte: 21 %
WBC: 5 10*3/uL (ref 3.8–10.8)

## 2020-08-11 LAB — HIGH SENSITIVITY CRP: hs-CRP: 0.4 mg/L

## 2020-08-11 LAB — VITAMIN D 25 HYDROXY (VIT D DEFICIENCY, FRACTURES): Vit D, 25-Hydroxy: 22 ng/mL — ABNORMAL LOW (ref 30–100)

## 2020-08-11 LAB — THYROID PANEL WITH TSH
Free Thyroxine Index: 1.6 (ref 1.4–3.8)
T3 Uptake: 31 % (ref 22–35)
T4, Total: 5.1 ug/dL (ref 4.9–10.5)
TSH: 2.05 mIU/L (ref 0.40–4.50)

## 2020-08-13 ENCOUNTER — Telehealth: Payer: Self-pay | Admitting: Family Medicine

## 2020-08-13 MED ORDER — TAMSULOSIN HCL 0.4 MG PO CAPS: 0.8000 mg | ORAL_CAPSULE | Freq: Every day | ORAL | 3 refills | Status: DC

## 2020-08-13 NOTE — Telephone Encounter (Signed)
Labs are notable for the following:  Vitamin D is low at 22.  We want this to be 50-80.  It is important to maintain a good vitamin D level, particularly with prostate cancer.  I recommend taking vitamin D3 at 5000 IU daily.  Recheck in 4 to 6 months.  Blood glucose was elevated at 126, which is in diabetes range.  Just to confirm, were you fasting?  It is very important to maintain a regular exercise regimen and to minimize dietary intake of processed carbohydrates including breads, pastas, cereals, sugars and sweets.  Recheck this with hemoglobin A1c in 4 to 6 months.  Triglycerides are elevated at 341.  Lifestyle changes above will help.  Recheck in 4-6 months.  Thyroid function looks good.  All else looks good.

## 2020-08-13 NOTE — Addendum Note (Signed)
Addended by: Hortencia Pilar on: 08/13/2020 09:24 AM   Modules accepted: Orders

## 2020-08-26 ENCOUNTER — Other Ambulatory Visit: Payer: Self-pay | Admitting: Cardiology

## 2020-08-27 NOTE — Telephone Encounter (Signed)
Informed patient to hold Eliquis. Patient voiced understanding.

## 2020-08-28 NOTE — Telephone Encounter (Signed)
This is a A-Fib clinic pt 

## 2020-09-06 ENCOUNTER — Telehealth: Payer: Self-pay | Admitting: Gastroenterology

## 2020-09-06 NOTE — Telephone Encounter (Signed)
This pt did not have a PV - thanks

## 2020-09-10 ENCOUNTER — Encounter: Payer: Self-pay | Admitting: Family Medicine

## 2020-09-10 MED ORDER — CIPROFLOXACIN HCL 500 MG PO TABS: 500.0000 mg | ORAL_TABLET | Freq: Two times a day (BID) | ORAL | 0 refills | Status: DC

## 2020-09-10 NOTE — Telephone Encounter (Signed)
Spoke with patient and he had misplaced his instructions but was able to find them on mychart. Also confirmed with patient to hold his Eliquis 2 days prior to procedure again.

## 2020-09-11 ENCOUNTER — Ambulatory Visit: Payer: Medicare Other | Admitting: Family Medicine

## 2020-09-17 ENCOUNTER — Ambulatory Visit (AMBULATORY_SURGERY_CENTER): Payer: Medicare Other | Admitting: Gastroenterology

## 2020-09-17 ENCOUNTER — Encounter: Payer: Self-pay | Admitting: Gastroenterology

## 2020-09-17 ENCOUNTER — Other Ambulatory Visit: Payer: Self-pay

## 2020-09-17 VITALS — BP 135/83 | HR 61 | Temp 98.2°F | Resp 18 | Ht 71.0 in | Wt 207.0 lb

## 2020-09-17 DIAGNOSIS — Z86010 Personal history of colonic polyps: Secondary | ICD-10-CM | POA: Diagnosis not present

## 2020-09-17 DIAGNOSIS — D123 Benign neoplasm of transverse colon: Secondary | ICD-10-CM | POA: Diagnosis not present

## 2020-09-17 DIAGNOSIS — D120 Benign neoplasm of cecum: Secondary | ICD-10-CM | POA: Diagnosis not present

## 2020-09-17 MED ORDER — SODIUM CHLORIDE 0.9 % IV SOLN: 500.0000 mL | Freq: Once | INTRAVENOUS | Status: DC

## 2020-09-17 NOTE — Patient Instructions (Signed)
Handouts given for polyps, diverticulosis, hemorrhoids and high fiber diet.  No aspirin, ibuprofen, naproxen or other NSAIDS for 2 weeks, take tylenol instead.  Resume your Eliquis at prior dose in 2 days.  YOU HAD AN ENDOSCOPIC PROCEDURE TODAY AT [facility]:   Refer to the procedure report that was given to you for any specific questions about what was found during the examination.  If the procedure report does not answer your questions, please call your gastroenterologist to clarify.  If you requested that your care partner not be given the details of your procedure findings, then the procedure report has been included in a sealed envelope for you to review at your convenience later.  YOU SHOULD EXPECT: Some feelings of bloating in the abdomen. Passage of more gas than usual.  Walking can help get rid of the air that was put into your GI tract during the procedure and reduce the bloating. If you had a lower endoscopy (such as a colonoscopy or flexible sigmoidoscopy) you may notice spotting of blood in your stool or on the toilet paper. If you underwent a bowel prep for your procedure, you may not have a normal bowel movement for a few days.  Please Note:  You might notice some irritation and congestion in your nose or some drainage.  This is from the oxygen used during your procedure.  There is no need for concern and it should clear up in a day or so.  SYMPTOMS TO REPORT IMMEDIATELY:   Following lower endoscopy (colonoscopy or flexible sigmoidoscopy):  Excessive amounts of blood in the stool  Significant tenderness or worsening of abdominal pains  Swelling of the abdomen that is new, acute  Fever of 100F or higher  For urgent or emergent issues, a gastroenterologist can be reached at any hour by calling [phone]. Do not use MyChart messaging for urgent concerns.    DIET:  We do recommend a small meal at first, but then you may proceed to your regular diet.  Drink  plenty of fluids but you should avoid alcoholic beverages for 24 hours.  ACTIVITY:  You should plan to take it easy for the rest of today and you should NOT DRIVE or use heavy machinery until tomorrow (because of the sedation medicines used during the test).    FOLLOW UP: Our staff will call the number listed on your records 48-72 hours following your procedure to check on you and address any questions or concerns that you may have regarding the information given to you following your procedure. If we do not reach you, we will leave a message.  We will attempt to reach you two times.  During this call, we will ask if you have developed any symptoms of COVID 19. If you develop any symptoms (ie: fever, flu-like symptoms, shortness of breath, cough etc.) before then, please call [phone].  If you test positive for Covid 19 in the 2 weeks post procedure, please call and report this information to [place].    If any biopsies were taken you will be contacted by phone or by letter within the next 1-3 weeks.  Please call us at [phone] if you have not heard about the biopsies in 3 weeks.    SIGNATURES/CONFIDENTIALITY: You and/or your care partner have signed paperwork which will be entered into your electronic medical record.  These signatures attest to the fact that that the information above on your After Visit Summary has been reviewed and is understood.  Full responsibility of the confidentiality of this discharge information lies with you and/or your care-partner.

## 2020-09-17 NOTE — Progress Notes (Signed)
Report to PACU, RN, vss, BBS= Clear.  

## 2020-09-17 NOTE — Progress Notes (Signed)
VS by CW  No changes to medical or social hx since previsit.  

## 2020-09-17 NOTE — Op Note (Signed)
[facility] Patient Name: Bradley Gilbert Procedure Date: 09/17/2020 10:26 AM MRN: [id number] Endoscopist: Ladene Artist , MD Age: 69 Referring MD:  Date of Birth: 1951/11/07 Gender: Male Account #: [id number] Procedure:                Colonoscopy Indications:              Surveillance: Personal history of adenomatous                            polyps on last colonoscopy 5 years ago Medicines:                Monitored Anesthesia Care Procedure:                Pre-Anesthesia Assessment:                           - Prior to the procedure, a History and Physical                            was performed, and patient medications and                            allergies were reviewed. The patient's tolerance of                            previous anesthesia was also reviewed. The risks                            and benefits of the procedure and the sedation                            options and risks were discussed with the patient.                            All questions were answered, and informed consent                            was obtained. Prior Anticoagulants: The patient has                            taken Eliquis (apixaban), last dose was 2 days                            prior to procedure. ASA Grade Assessment: II - A                            patient with mild systemic disease. After reviewing                            the risks and benefits, the patient was deemed in                            satisfactory condition to undergo the procedure.  After obtaining informed consent, the colonoscope                            was passed under direct vision. Throughout the                            procedure, the patient's blood pressure, pulse, and                            oxygen saturations were monitored continuously. The                            Colonoscope was introduced through the anus and                            advanced to  the the cecum, identified by                            appendiceal orifice and ileocecal valve. The                            ileocecal valve, appendiceal orifice, and rectum                            were photographed. The quality of the bowel                            preparation was adequate. The colonoscopy was                            performed without difficulty. The patient tolerated                            the procedure well. Scope In: 10:32:51 AM Scope Out: 10:46:09 AM Scope Withdrawal Time: 0 hours 11 minutes 14 seconds  Total Procedure Duration: 0 hours 13 minutes 18 seconds  Findings:                 The perianal and digital rectal examinations were                            normal.                           Three sessile polyps were found in the transverse                            colon (1) and cecum (2). The polyps were 4 to 5 mm                            in size. These polyps were removed with a cold                            snare. Resection and retrieval were complete.  Multiple small-mouthed diverticula were found in                            the left colon. There was no evidence of                            diverticular bleeding.                           Internal hemorrhoids were found during                            retroflexion. The hemorrhoids were small and Grade                            I (internal hemorrhoids that do not prolapse).                           The exam was otherwise without abnormality on                            direct and retroflexion views. Complications:            No immediate complications. Estimated blood loss:                            None. Estimated Blood Loss:     Estimated blood loss: none. Impression:               - Three 4 to 5 mm polyps in the transverse colon                            and in the cecum, removed with a cold snare.                            Resected and retrieved.                            - Mild diverticulosis in the left colon.                           - Internal hemorrhoids.                           - The examination was otherwise normal on direct                            and retroflexion views. Recommendation:           - Repeat colonoscopy after studies are complete for                            surveillance based on pathology results.                           - Resume Eliquis (apixaban) in 2 days at prior  dose. Refer to managing physician for further                            adjustment of therapy.                           - Patient has a contact number available for                            emergencies. The signs and symptoms of potential                            delayed complications were discussed with the                            patient. Return to normal activities tomorrow.                            Written discharge instructions were provided to the                            patient.                           - High fiber diet.                           - Continue present medications.                           - Await pathology results.                           - No aspirin, ibuprofen, naproxen, or other                            non-steroidal anti-inflammatory drugs for 2 weeks                            after polyp removal. Ladene Artist, MD 09/17/2020 10:52:17 AM This report has been signed electronically.

## 2020-09-17 NOTE — Progress Notes (Signed)
Called to room to assist during endoscopic procedure.  Patient ID and intended procedure confirmed with present staff. Received instructions for my participation in the procedure from the performing physician.  

## 2020-09-19 ENCOUNTER — Telehealth: Payer: Self-pay | Admitting: *Deleted

## 2020-09-19 NOTE — Telephone Encounter (Signed)
  Follow up Call-  Call back number 09/17/2020  Post procedure Call Back phone  # [phone]  Permission to leave phone message Yes  Some recent data might be hidden    Spoke with wife Patient questions:  Do you have a fever, pain , or abdominal swelling? No. Pain Score  0 *  Have you tolerated food without any problems? Yes.    Have you been able to return to your normal activities? Yes.    Do you have any questions about your discharge instructions: Diet   No. Medications  No. Follow up visit  No.  Do you have questions or concerns about your Care? No.  Actions: * If pain score is 4 or above: No action needed, pain <4.  1. Have you developed a fever since your procedure? no  2.   Have you had an respiratory symptoms (SOB or cough) since your procedure? no  3.   Have you tested positive for COVID 19 since your procedure no  4.   Have you had any family members/close contacts diagnosed with the COVID 19 since your procedure?  no   If yes to any of these questions please route to Joylene John, RN and Joella Prince, RN

## 2020-09-25 ENCOUNTER — Encounter: Payer: Self-pay | Admitting: Gastroenterology

## 2020-11-27 ENCOUNTER — Ambulatory Visit: Payer: Medicare Other | Admitting: Cardiology

## 2020-12-20 ENCOUNTER — Ambulatory Visit: Payer: Medicare Other | Admitting: Cardiology

## 2021-01-14 ENCOUNTER — Other Ambulatory Visit: Payer: Self-pay | Admitting: Internal Medicine

## 2021-01-14 ENCOUNTER — Other Ambulatory Visit: Payer: Self-pay | Admitting: Cardiology

## 2021-01-15 ENCOUNTER — Other Ambulatory Visit: Payer: Self-pay

## 2021-01-15 MED ORDER — POTASSIUM CHLORIDE CRYS ER 20 MEQ PO TBCR: 20.0000 meq | EXTENDED_RELEASE_TABLET | Freq: Two times a day (BID) | ORAL | 2 refills | Status: DC

## 2021-01-18 ENCOUNTER — Ambulatory Visit: Payer: Medicare Other | Admitting: Cardiology

## 2021-01-18 ENCOUNTER — Encounter: Payer: Self-pay | Admitting: Cardiology

## 2021-01-18 ENCOUNTER — Other Ambulatory Visit: Payer: Self-pay

## 2021-01-18 VITALS — BP 100/50 | HR 69 | Ht 71.0 in | Wt 207.0 lb

## 2021-01-18 DIAGNOSIS — I480 Paroxysmal atrial fibrillation: Secondary | ICD-10-CM

## 2021-01-18 DIAGNOSIS — I10 Essential (primary) hypertension: Secondary | ICD-10-CM

## 2021-01-18 DIAGNOSIS — I2510 Atherosclerotic heart disease of native coronary artery without angina pectoris: Secondary | ICD-10-CM

## 2021-01-18 NOTE — Patient Instructions (Signed)
Medication Instructions:  The current medical regimen is effective;  continue present plan and medications.  *If you need a refill on your cardiac medications before your next appointment, please call your pharmacy*  Follow-Up: At [facility], you and your health needs are our priority.  As part of our continuing mission to provide you with exceptional heart care, we have created designated Provider Care Teams.  These Care Teams include your primary Cardiologist (physician) and Advanced Practice Providers (APPs -  Physician Assistants and Nurse Practitioners) who all work together to provide you with the care you need, when you need it.  We recommend signing up for the patient portal called "MyChart".  Sign up information is provided on this After Visit Summary.  MyChart is used to connect with patients for Virtual Visits (Telemedicine).  Patients are able to view lab/test results, encounter notes, upcoming appointments, etc.  Non-urgent messages can be sent to your provider as well.   To learn more about what you can do with MyChart, go to [email or web address].com.    Your next appointment:   12 month(s)  The format for your next appointment:   In Person  Provider:   Mark Skains, MD   Thank you for choosing [facility]!!      

## 2021-01-18 NOTE — Progress Notes (Signed)
Cardiology Office Note:    Date:  01/18/2021   ID:  Bradley Gilbert, Bradley Gilbert 1951-10-13, MRN [id number]  PCP:  Eunice Blase, MD   [place]  Cardiologist:  Candee Furbish, MD  Advanced Practice Provider:  No care team member to display Electrophysiologist:  Thompson Grayer, MD       Referring MD: Eunice Blase, MD     History of Present Illness:    Bradley Gilbert is a 70 y.o. male here for the follow-up of atrial fibrillation, post ablation 2020, nonobstructive coronary artery disease, Dr. Inda Merlin.  Seen in atrial fibrillation clinic. Had ablation by Dr. Rayann Heman.  Eliquis  Cardiac catheterization 2012 mild CAD nonobstructive. 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  2018 showed atrial fibrillation with GI illness.  Demand ischemia.  About a year later had another episode of atrial fibrillation heart rates in the 160s  Left bundle branch block chronic.  Has been treated with prostate cancer at [place], radiation. Hot flashes at night.  This wakes him up almost every hour.  No high risk symptoms such as syncope bleeding orthopnea PND  Friend of [name], my patient. Cowboy.  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  He has 10 horses  Past Medical History:  Diagnosis Date  . Allergic rhinitis   . Complication of anesthesia    hard time waking up   . Coronary artery disease    Cath 08/15/11 with 30% mid LAD, o/w no CAD  . Hearing loss   . History of colon polyps    BENGIN  . Left rotator cuff tear   . OA (osteoarthritis)    KNEES AND LEFT SHOULDER  . Paroxysmal atrial fibrillation (HCC)   . PONV (postoperative nausea and vomiting)     Past Surgical History:  Procedure Laterality Date  . ATRIAL FIBRILLATION ABLATION N/A 01/18/2019   Procedure: ATRIAL FIBRILLATION ABLATION;  Surgeon: Thompson Grayer, MD;  Location: [place] [facility];  Service:  Cardiovascular;  Laterality: N/A;  . 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 CARPAL TUNNEL RELEASE Right 1990  . COLONOSCOPY    . KNEE ARTHROSCOPY Right YRS AGO  . LAPAROSCOPIC INGUINAL HERNIA REPAIR Right 09-05-2011  . POLYPECTOMY    . PROSTATE BIOPSY N/A 10/06/2019   Procedure: PROSTATE BIOPSY;  Surgeon: Royston Cowper, MD;  Location: [facility];  Service: Urology;  Laterality: N/A;  . SHOULDER ARTHROSCOPY WITH SUBACROMIAL DECOMPRESSION AND BICEP TENDON REPAIR Left 05/12/2014   Procedure: LEFT ARTHROSCOPY SHOULDER WITH EXAM UNDER ANESTHESIA,SUBACROMIAL DECOMPRESSION DISTAL CLAVICAL RESECTION LABIAL REPAIR VERSES DEBRIDEMENT;  Surgeon: Sydnee Cabal, MD;  Location: [place];  Service: Orthopedics;  Laterality: Left;  . TRANSTHORACIC ECHOCARDIOGRAM  08-19-2011   MILD LVH/  EF 54-00%/  GRADE I DIASTOLIC DYSFUNCTION    Current Medications: Current Meds  Medication Sig  . apixaban (ELIQUIS) 5 MG TABS tablet Take 1 tablet (5 mg total) by mouth 2 (two) times daily.  Marland Kitchen atorvastatin (LIPITOR) 20 MG tablet Take 1 tablet (20 mg total) by mouth daily. Please keep upcoming appt in February 2022 with Dr. Marlou Porch before anymore refills. Thank you  . B Complex Vitamins (VITAMIN-B COMPLEX) TABS Take 1  tablet by mouth daily.  . ciprofloxacin (CIPRO) 500 MG tablet Take 1 tablet (500 mg total) by mouth 2 (two) times daily.  Marland Kitchen diltiazem (CARDIZEM) 30 MG tablet Take 1 tablet (30 mg total) by mouth as directed. Take one tablet by mouth every 4 hours AS NEEDED for A-fib HR > 100 as long as BP > 100.  Marland Kitchen GRAPE SEED EXTRACT PO Take by mouth in the morning and at bedtime.  . Lycopene 10 MG CAPS Take by mouth.  . metoprolol succinate (TOPROL-XL) 25 MG 24 hr tablet TAKE 1 TABLET BY MOUTH AT  BEDTIME  . metoprolol tartrate (LOPRESSOR)  25 MG tablet Take 0.5 tablets (12.5 mg total) by mouth every 6 (six) hours as needed (breakthrough afib).  . Multiple Vitamins-Minerals (MULTIVITAMIN WITH MINERALS) tablet Take 1 tablet by mouth daily.  . potassium chloride SA (KLOR-CON) 20 MEQ tablet Take 1 tablet (20 mEq total) by mouth 2 (two) times daily.  . tamsulosin (FLOMAX) 0.4 MG CAPS capsule Take 2 capsules (0.8 mg total) by mouth daily.     Allergies:   Shellfish allergy and Sulfa antibiotics   Social History   Socioeconomic History  . Marital status: Married    Spouse name: Not on file  . Number of children: Not on file  . Years of education: Not on file  . Highest education level: Not on file  Occupational History  . Occupation: Diet Therapist, music: [employer]  Tobacco Use  . Smoking status: Never Smoker  . Smokeless tobacco: Never Used  Vaping Use  . Vaping Use: Never used  Substance and Sexual Activity  . Alcohol use: No    Alcohol/week: 0.0 standard drinks  . Drug use: No  . Sexual activity: Not on file  Other Topics Concern  . Not on file  Social History Narrative   Graduate of [employer].  Married - '85.  1 son - '70,  2 daughters - '74, '87;   5 grandchildren.  Hobbies - keeps horses and ponies.  Marriage - good health            Social Determinants of Health   Financial Resource Strain: Not on file  Food Insecurity: Not on file  Transportation Needs: Not on file  Physical Activity: Not on file  Stress: Not on file  Social Connections: Not on file     Family History: The patient's family history includes COPD in his father; Cancer in his father; Diabetes in his mother; Heart disease in his father; Throat cancer in his father. There is no history of Colon cancer, Prostate cancer, Rectal cancer, or Stomach cancer.  ROS:   Please see the history of present illness.     All other systems reviewed and are negative.  EKGs/Labs/Other Studies Reviewed:     EKG:  EKG is  ordered  today.  The ekg ordered today demonstrates sinus rhythm left bundle branch block 69  Recent Labs: 07/31/2020: Magnesium 1.9 08/10/2020: ALT 41; BUN 22; Creat 1.00; Hemoglobin 13.4; Platelets 180; Potassium 4.4; Sodium 138; TSH 2.05  Recent Lipid Panel    Component Value Date/Time   CHOL 130 08/10/2020 1340   TRIG 341 (H) 08/10/2020 1340   HDL 49 08/10/2020 1340   CHOLHDL 2.7 08/10/2020 1340   VLDL 16 09/10/2018 0439   LDLCALC 45 08/10/2020 1340   LDLDIRECT 102.0 02/18/2016 1647    Physical Exam:    VS:  BP (!) 100/50 (BP Location: Left Arm, Patient Position: Sitting,  Cuff Size: Normal)   Pulse 69   Ht 5\' 11"  (1.803 m)   Wt 207 lb (93.9 kg)   SpO2 96%   BMI 28.87 kg/m     Wt Readings from Last 3 Encounters:  01/18/21 207 lb (93.9 kg)  09/17/20 207 lb (93.9 kg)  08/10/20 204 lb 12.8 oz (92.9 kg)     GEN:  Well nourished, well developed in no acute distress HEENT: Normal NECK: No JVD; No carotid bruits LYMPHATICS: No lymphadenopathy CARDIAC: RRR, no murmurs, rubs, gallops RESPIRATORY:  Clear to auscultation without rales, wheezing or rhonchi  ABDOMEN: Soft, non-tender, non-distended MUSCULOSKELETAL:  No edema; No deformity  SKIN: Warm and dry NEUROLOGIC:  Alert and oriented x 3 PSYCHIATRIC:  Normal affect   ASSESSMENT:    1. Paroxysmal atrial fibrillation (HCC)   2. Essential hypertension   3. Coronary artery disease involving native coronary artery of native heart without angina pectoris    PLAN:    In order of problems listed above:  Paroxysmal atrial fibrillation -Post ablation Dr. Rayann Heman 2020.  Doing very well.  No recurrence.  Nonobstructive CAD 2012 left bundle branch block -Doing well.  No anginal symptoms.  Continue with atorvastatin.  LDL goal less than 70.  LDL 45.  Prostate cancer -Getting Lupron.  Hot flashes at night have been bothering him.  His PSA is much reduced.  Chronic anticoagulation -Continue with Eliquis.  No bleeding.  Hemoglobin  13.4.  1 year follow-up    Medication Adjustments/Labs and Tests Ordered: Current medicines are reviewed at length with the patient today.  Concerns regarding medicines are outlined above.  Orders Placed This Encounter  Procedures  . EKG 12-Lead   No orders of the defined types were placed in this encounter.   Patient Instructions  Medication Instructions:  The current medical regimen is effective;  continue present plan and medications.  *If you need a refill on your cardiac medications before your next appointment, please call your pharmacy*  Follow-Up: At [facility], you and your health needs are our priority.  As part of our continuing mission to provide you with exceptional heart care, we have created designated Provider Care Teams.  These Care Teams include your primary Cardiologist (physician) and Advanced Practice Providers (APPs -  Physician Assistants and Nurse Practitioners) who all work together to provide you with the care you need, when you need it.  We recommend signing up for the patient portal called "MyChart".  Sign up information is provided on this After Visit Summary.  MyChart is used to connect with patients for Virtual Visits (Telemedicine).  Patients are able to view lab/test results, encounter notes, upcoming appointments, etc.  Non-urgent messages can be sent to your provider as well.   To learn more about what you can do with MyChart, go to [email or web address].    Your next appointment:   12 month(s)  The format for your next appointment:   In Person  Provider:   Candee Furbish, MD   Thank you for choosing [facility]!!        Signed, Candee Furbish, MD  01/18/2021 10:00 AM    [place]

## 2021-02-09 IMAGING — CT CT HEART MORPH/PULM VEIN W/ CM & W/O CA SCORE
2 of 9 series · 6 of 20 positions shown, 7 images · IV contrast (APPLIED)
Comparison: Chest CT 01/23/2014.

Addendum:
EXAM:
OVER-READ INTERPRETATION  CT CHEST

The following report is an over-read performed by radiologist Dr.
Adenyo Ologo [REDACTED] on 01/14/2019. This
over-read does not include interpretation of cardiac or coronary
anatomy or pathology. The coronary calcium score/coronary CTA
interpretation by the cardiologist is attached.
CLINICAL DATA: 67-year-old male with atrial fibrillation scheduled
for an ablation.
Cardiac CT/CTA
TECHNIQUE: The patient was scanned on a Siemens Somatom scanner.

[Series 10: best diast · axial · 0.34mm/px · z∈[+1198,+1258]mm · 3 of 299 slices shown, 4 images]
[im 75/299  vessel]
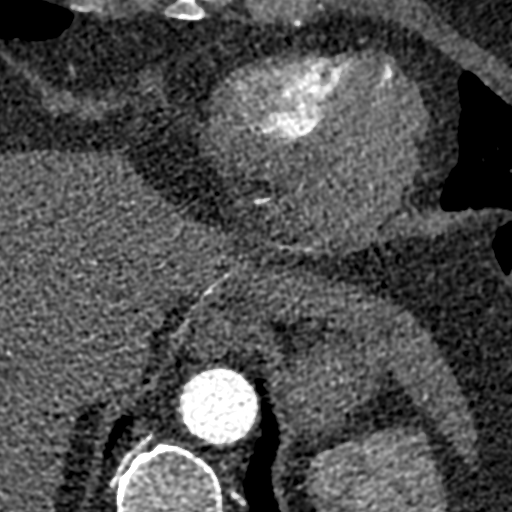
[im 75/299  lung]
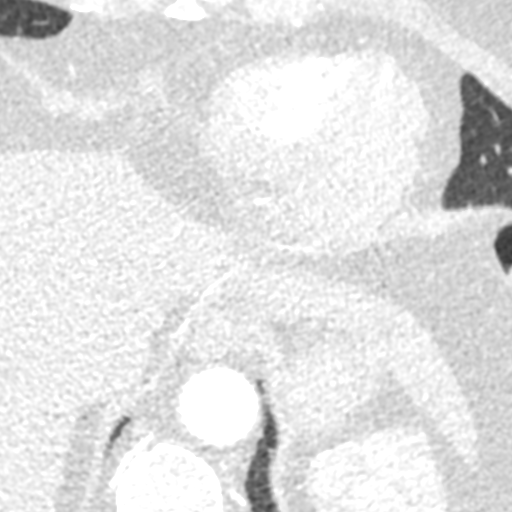
[im 150/299  vessel]
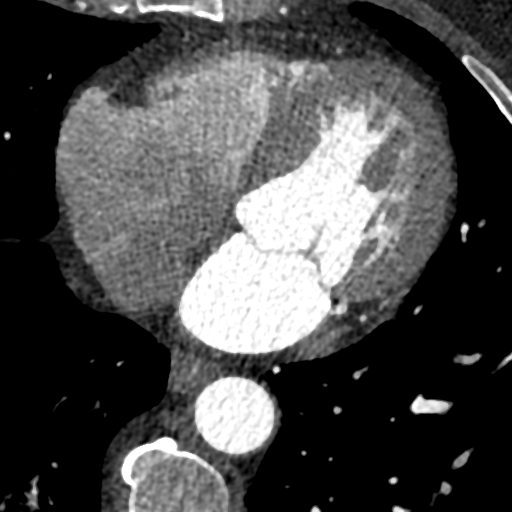
[im 224/299  vessel]
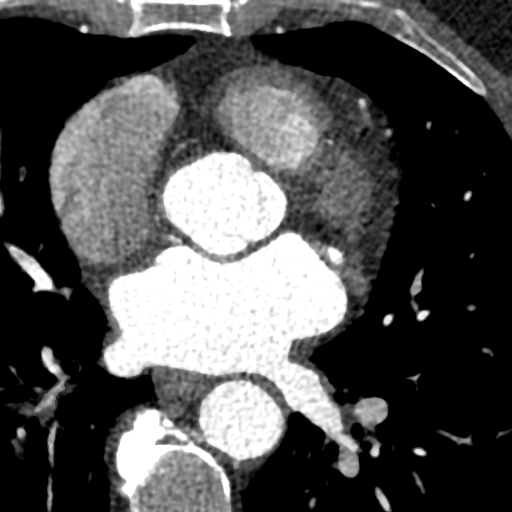

[Series 11: +300 ms · axial · 0.34mm/px · z∈[+1198,+1258]mm · 3 of 299 slices shown]
[im 75/299  vessel]
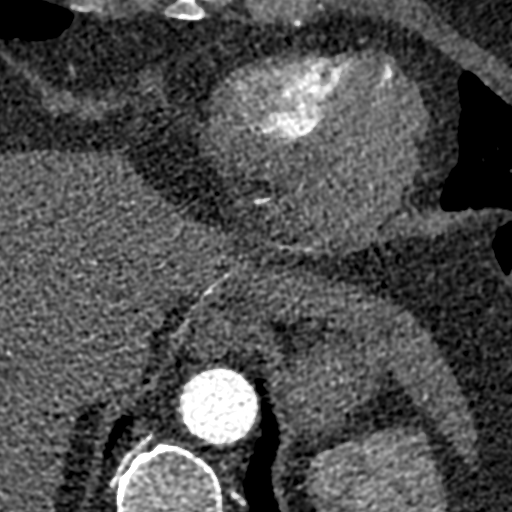
[im 150/299  vessel]
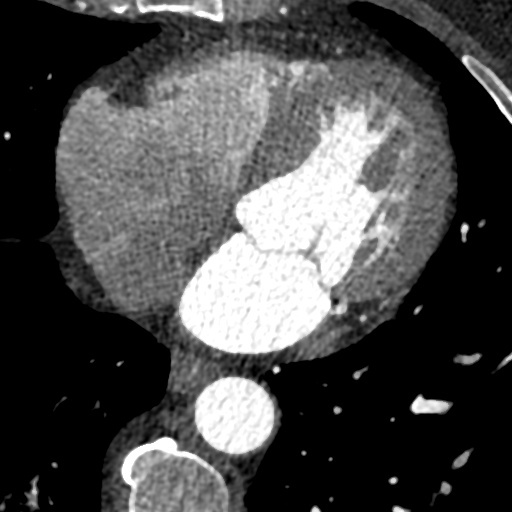
[im 224/299  vessel]
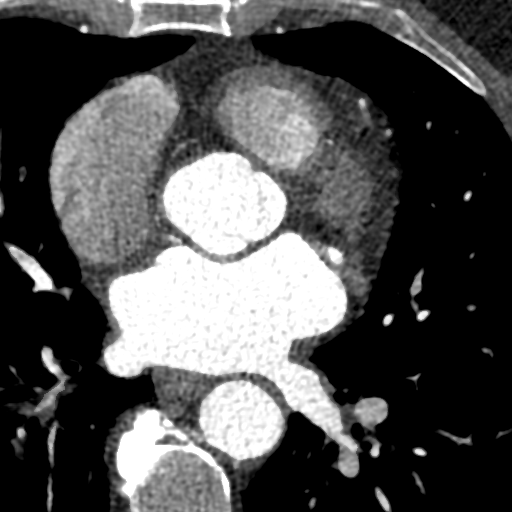

[6 of 20 positions shown; findings below may reference images not displayed]

FINDINGS: Calcified pleural plaque in the anterolateral aspect of the left
hemithorax. Mild linear scarring in the right lower lobe. 4 mm right
lower lobe nodule (axial image 19 of series 15), stable compared to
1344. 3 mm left lower lobe nodule (axial image 23 of series 15),
stable compared to 1344. Within the visualized portions of the
thorax there are no other larger more suspicious appearing pulmonary
nodules or masses, there is no acute consolidative airspace disease,
no pleural effusions, no pneumothorax and no lymphadenopathy.
Visualized portions of the upper abdomen demonstrates a 1.8 x 2.5 cm
low-attenuation lesion in segment 2 of the liver, compatible with a
simple cyst. There are no aggressive appearing lytic or blastic
lesions noted in the visualized portions of the skeleton.
IMPRESSION: 1. Small calcified pleural plaque in the anterolateral aspect of the
left hemithorax, likely related to prior left-sided infection or
trauma.
2. Small pulmonary nodules measuring 3-4 mm in the lower lobes of
the lungs bilaterally, stable compared to prior study from 1344,
considered definitively benign.
FINDINGS: A 120 kV prospective scan was triggered in the descending thoracic
aorta at 111 HU's. Gantry rotation speed was 280 msecs and
collimation was .9 mm. No beta blockade and no NTG was given. The 3D
data set was reconstructed in 5% intervals of the 60-80 % of the R-R
cycle. Diastolic phases were analyzed on a dedicated work station
using MPR, MIP and VRT modes. The patient received 80 cc of
contrast.

There is normal pulmonary vein drainage into the left atrium (2 on
the right and 2 on the left) with ostial measurements as follows:

RUPV: 20.7 x 13.9 mm

RLPV: 20.2 x 19.0 mm

LUPV: 22.9 x 15.5 mm

LLPV: 16.7 x 8.8 mm

The left atrial appendage is large with no evidence for a thrombus.

The esophagus runs in the left atrial midline and is not in the
proximity to any of the pulmonary veins.

Aorta:  Normal caliber.  No dissection or calcifications.

Aortic Valve:  Trileaflet.  No calcifications.

Coronary Arteries: Left coronary dominance. Separate origin of the
LAD and LCX arteries from the left coronary sinus. The study was
performed without use of NTG and insufficient for plaque evaluation.
Calcium score is 127 that represents 49 percentile for age/sex.
IMPRESSION: 1. There is normal pulmonary vein drainage into the left atrium.

2. The left atrial appendage is large with no evidence for a
thrombus.

3. The esophagus runs in the left atrial midline and is not in the
proximity to any of the pulmonary veins.

4. Calcium score is 127 that represents 49 percentile for age/sex.

*** End of Addendum ***

## 2021-02-25 ENCOUNTER — Other Ambulatory Visit: Payer: Self-pay | Admitting: Cardiology

## 2021-06-27 ENCOUNTER — Encounter: Payer: Self-pay | Admitting: Family Medicine

## 2021-06-28 MED ORDER — CIPROFLOXACIN HCL 500 MG PO TABS: 500.0000 mg | ORAL_TABLET | Freq: Two times a day (BID) | ORAL | 0 refills | Status: DC

## 2021-07-17 ENCOUNTER — Other Ambulatory Visit: Payer: Self-pay | Admitting: Family Medicine

## 2021-09-21 ENCOUNTER — Other Ambulatory Visit: Payer: Self-pay | Admitting: Internal Medicine

## 2021-12-29 ENCOUNTER — Other Ambulatory Visit: Payer: Self-pay | Admitting: Internal Medicine

## 2022-01-19 ENCOUNTER — Other Ambulatory Visit: Payer: Self-pay | Admitting: Internal Medicine

## 2022-02-11 ENCOUNTER — Telehealth: Payer: Self-pay | Admitting: *Deleted

## 2022-02-11 MED ORDER — POTASSIUM CHLORIDE CRYS ER 20 MEQ PO TBCR: 20.0000 meq | EXTENDED_RELEASE_TABLET | Freq: Two times a day (BID) | ORAL | 0 refills | Status: DC

## 2022-02-11 NOTE — Telephone Encounter (Signed)
Patient was here today w his wife.  Reports he is a patient of Dr. Marlou Porch and is requesting a refill of potassium.  It has previously been prescribed by Dr. Rayann Heman.  His last appointment with Dr. Marlou Porch was 12/2020.  I adv he needs to make a follow up appointment and refilled his potassium for 90 days. Sent to [facility] per pt preference. ?

## 2022-02-13 NOTE — Telephone Encounter (Signed)
Message sent to scheduling to contact pt to schedule his overdue 1 yr f/u appt.   ?

## 2022-03-01 ENCOUNTER — Other Ambulatory Visit: Payer: Self-pay | Admitting: Cardiology

## 2022-04-14 ENCOUNTER — Other Ambulatory Visit: Payer: Self-pay | Admitting: Cardiology

## 2022-04-16 ENCOUNTER — Encounter: Payer: Self-pay | Admitting: Internal Medicine

## 2022-04-16 ENCOUNTER — Ambulatory Visit: Payer: Medicare Other | Admitting: Internal Medicine

## 2022-04-16 VITALS — BP 110/62 | HR 68 | Ht 71.0 in | Wt 199.0 lb

## 2022-04-16 DIAGNOSIS — I10 Essential (primary) hypertension: Secondary | ICD-10-CM | POA: Diagnosis not present

## 2022-04-16 DIAGNOSIS — I480 Paroxysmal atrial fibrillation: Secondary | ICD-10-CM | POA: Diagnosis not present

## 2022-04-16 MED ORDER — POTASSIUM CHLORIDE CRYS ER 20 MEQ PO TBCR
20.0000 meq | EXTENDED_RELEASE_TABLET | Freq: Every day | ORAL | 3 refills | Status: DC
Start: 2022-04-16 — End: 2022-08-11

## 2022-04-16 MED ORDER — POTASSIUM CHLORIDE CRYS ER 20 MEQ PO TBCR: 20.0000 meq | EXTENDED_RELEASE_TABLET | Freq: Every day | ORAL | 3 refills | Status: DC

## 2022-04-16 NOTE — Progress Notes (Signed)
PCP: Eunice Blase, MD Primary Cardiologist: Dr Marlou Porch Primary EP: Dr Rayann Heman  Bradley Gilbert is a 71 y.o. male who presents today for routine electrophysiology followup.  Since last being seen in our clinic, the patient reports doing very well.  Today, he denies symptoms of palpitations, chest pain, shortness of breath,  lower extremity edema, dizziness, presyncope, or syncope.  The patient is otherwise without complaint today.   Past Medical History:  Diagnosis Date   Allergic rhinitis    Complication of anesthesia    hard time waking up    Coronary artery disease    Cath 08/15/11 with 30% mid LAD, o/w no CAD   Hearing loss    History of colon polyps    BENGIN   Left rotator cuff tear    OA (osteoarthritis)    KNEES AND LEFT SHOULDER   Paroxysmal atrial fibrillation (HCC)    PONV (postoperative nausea and vomiting)    Past Surgical History:  Procedure Laterality Date   ATRIAL FIBRILLATION ABLATION N/A 01/18/2019   Procedure: ATRIAL FIBRILLATION ABLATION;  Surgeon: Thompson Grayer, MD;  Location: [place] [facility];  Service: Cardiovascular;  Laterality: N/A;   CARDIAC CATHETERIZATION  08-15-2011  DR BENSIMHON   FALSE POSTIVE STRESS TEST--  MINIMAL NON-BSTRUCTIVE CAD ,  mLAD 30%, LM congenitally absent (there was seprate ostia for the lad & the lcfx)/   NORMAL LVF   CARPAL TUNNEL RELEASE Right 1990   COLONOSCOPY     KNEE ARTHROSCOPY Right YRS AGO   LAPAROSCOPIC INGUINAL HERNIA REPAIR Right 09-05-2011   POLYPECTOMY     PROSTATE BIOPSY N/A 10/06/2019   Procedure: PROSTATE BIOPSY;  Surgeon: Royston Cowper, MD;  Location: [facility];  Service: Urology;  Laterality: N/A;   SHOULDER ARTHROSCOPY WITH SUBACROMIAL DECOMPRESSION AND BICEP TENDON REPAIR Left 05/12/2014   Procedure: LEFT ARTHROSCOPY SHOULDER WITH EXAM UNDER ANESTHESIA,SUBACROMIAL DECOMPRESSION DISTAL CLAVICAL RESECTION LABIAL REPAIR VERSES DEBRIDEMENT;  Surgeon: Sydnee Cabal, MD;  Location: [place];   Service: Orthopedics;  Laterality: Left;   TRANSTHORACIC ECHOCARDIOGRAM  08-19-2011   MILD LVH/  EF 35-57%/  GRADE I DIASTOLIC DYSFUNCTION    ROS- all systems are reviewed and negatives except as per HPI above  Current Outpatient Medications  Medication Sig Dispense Refill   atorvastatin (LIPITOR) 20 MG tablet TAKE 1 TABLET BY MOUTH  DAILY 90 tablet 0   B Complex Vitamins (VITAMIN-B COMPLEX) TABS Take 1 tablet by mouth daily.     ciprofloxacin (CIPRO) 500 MG tablet Take 1 tablet (500 mg total) by mouth 2 (two) times daily. 14 tablet 0   ciprofloxacin (CIPRO) 500 MG tablet Take 1 tablet (500 mg total) by mouth 2 (two) times daily. 20 tablet 0   diltiazem (CARDIZEM) 30 MG tablet Take 1 tablet (30 mg total) by mouth as directed. Take one tablet by mouth every 4 hours AS NEEDED for A-fib HR > 100 as long as BP > 100. 45 tablet 1   ELIQUIS 5 MG TABS tablet TAKE 1 TABLET BY MOUTH  TWICE DAILY 180 tablet 3   GRAPE SEED EXTRACT PO Take by mouth in the morning and at bedtime.     Lycopene 10 MG CAPS Take by mouth.     metoprolol succinate (TOPROL-XL) 25 MG 24 hr tablet Take 1 tablet (25 mg total) by mouth at bedtime. Please make overdue appt with Cardiologist before anymore refills. Thank you 1st attempt 30 tablet 0   Multiple Vitamins-Minerals (MULTIVITAMIN WITH MINERALS) tablet Take  1 tablet by mouth daily.     potassium chloride SA (KLOR-CON M) 20 MEQ tablet TAKE 1 TABLET BY MOUTH TWICE  DAILY 180 tablet 0   tamsulosin (FLOMAX) 0.4 MG CAPS capsule Take 2 capsules (0.8 mg total) by mouth daily. 180 capsule 3   metoprolol tartrate (LOPRESSOR) 25 MG tablet Take 0.5 tablets (12.5 mg total) by mouth every 6 (six) hours as needed (breakthrough afib). 180 tablet 3   No current facility-administered medications for this visit.    Physical Exam: Vitals:   04/16/22 1009  BP: 110/62  Pulse: 68  SpO2: 95%  Weight: 199 lb (90.3 kg)  Height: '5\' 11"'$  (1.803 m)    GEN- The patient is well appearing,  alert and oriented x 3 today.   Head- normocephalic, atraumatic Eyes-  Sclera clear, conjunctiva pink Ears- hearing intact Oropharynx- clear Lungs- Clear to ausculation bilaterally, normal work of breathing Heart- Regular rate and rhythm, no murmurs, rubs or gallops, PMI not laterally displaced GI- soft, NT, ND, + BS Extremities- no clubbing, cyanosis, or edema  Wt Readings from Last 3 Encounters:  04/16/22 199 lb (90.3 kg)  01/18/21 207 lb (93.9 kg)  09/17/20 207 lb (93.9 kg)    EKG tracing ordered today is personally reviewed and shows sinus rhythm 68 bpm, PR 158 msec, LBBB (QRS 146 msec)  Assessment and Plan:  Paroxysmal atrial fibrillation Resolved post ablation Continue eliquis Stop toprol  2. HTN Stable No change required today  3. HL Stable No change required today  4. K I previously advised that he reduce KDur to 20 meq daily.  He did not do so.  I have again advised this change.  Hopefully, his KDur can be further weaned on return. I am not convinced that he will require this going forward.  Risks, benefits and potential toxicities for medications prescribed and/or refilled reviewed with patient today.   Return to see EP APP in a year  Thompson Grayer MD, [facility] 04/16/2022 10:14 AM

## 2022-04-16 NOTE — Patient Instructions (Signed)
Medication Instructions:  Your physician has recommended you make the following change in your medication:  DECREASE Potassium to 20 mEq ONCE daily 2.  STOP Toprol  *If you need a refill on your cardiac medications before your next appointment, please call your pharmacy*   Lab Work: None ordered   Testing/Procedures: None ordered   Follow-Up: At [facility], you and your health needs are our priority.  As part of our continuing mission to provide you with exceptional heart care, we have created designated Provider Care Teams.  These Care Teams include your primary Cardiologist (physician) and Advanced Practice Providers (APPs -  Physician Assistants and Nurse Practitioners) who all work together to provide you with the care you need, when you need it.  Your next appointment:   1 year(s)  The format for your next appointment:   In Person  Provider:   Tommye Standard, PA-C    Thank you for choosing [facility]!!   [phone]  Other Instructions   Important Information About Sugar

## 2022-06-04 ENCOUNTER — Ambulatory Visit (INDEPENDENT_AMBULATORY_CARE_PROVIDER_SITE_OTHER): Payer: Medicare Other | Admitting: Cardiology

## 2022-06-04 ENCOUNTER — Encounter: Payer: Self-pay | Admitting: Cardiology

## 2022-06-04 DIAGNOSIS — Z7901 Long term (current) use of anticoagulants: Secondary | ICD-10-CM | POA: Diagnosis not present

## 2022-06-04 DIAGNOSIS — I480 Paroxysmal atrial fibrillation: Secondary | ICD-10-CM | POA: Diagnosis not present

## 2022-06-04 DIAGNOSIS — E7800 Pure hypercholesterolemia, unspecified: Secondary | ICD-10-CM | POA: Diagnosis not present

## 2022-06-04 NOTE — Assessment & Plan Note (Signed)
Continuing with atorvastatin 20 mg a day.  No myalgias.  Last LDL 45.  Excellent at goal.  Continue with current prescription drug management.

## 2022-06-04 NOTE — Assessment & Plan Note (Addendum)
Underwent atrial fibrillation ablation in 2020 with Dr. Rayann Heman.  Overall doing well.  Continuing with Eliquis 5 mg twice a day.  Off of metoprolol 25 mg now.  Has diltiazem 30 mg to take as needed.

## 2022-06-04 NOTE — Patient Instructions (Signed)
Medication Instructions: Your physician recommends that you continue on your current medications as directed. Please refer to the Current Medication list given to you today.  *If you need a refill on your cardiac medications before your next appointment, please call your pharmacy*  Follow-Up: At [facility], you and your health needs are our priority.  As part of our continuing mission to provide you with exceptional heart care, we have created designated Provider Care Teams.  These Care Teams include your primary Cardiologist (physician) and Advanced Practice Providers (APPs -  Physician Assistants and Nurse Practitioners) who all work together to provide you with the care you need, when you need it.   Your next appointment:   1 year(s)  The format for your next appointment:   In Person  Provider:   Candee Furbish, MD   Important Information About Sugar

## 2022-06-04 NOTE — Assessment & Plan Note (Signed)
Eliquis 5 mg twice a day for prescription drug management.  High risk medication management, lab work, monitoring hemoglobin and creatinine closely all of which are stable.

## 2022-06-04 NOTE — Progress Notes (Signed)
Cardiology Office Note:    Date:  06/04/2022   ID:  Bradley Gilbert, DOB 1951-11-21, MRN [id number]  PCP:  Bradley Blase, MD   [facility] Providers Cardiologist:  Bradley Furbish, MD Electrophysiologist:  Bradley Grayer, MD     Referring MD: Bradley Blase, MD    History of Present Illness:    Bradley Gilbert is a 71 y.o. male here for the follow-up of paroxysmal atrial fibrillation post atrial fibrillation ablation in 2020, Dr. 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  Prostate cancer [facility], Dr. Rosana Gilbert Lupron radiation hot flashes.  Friend of Bradley Gilbert patient of mine.  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  He has 10 horses.  Prostate cancer - feels better. Was previously given 4 years to lives.Overall doing well.   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 Getting a caboose that can fit a wheel chair. Electric. Made by [name].  He enjoys going down to [employer].    Past Medical History:  Diagnosis Date   Allergic rhinitis    Complication of anesthesia    hard time waking up    Coronary artery disease    Cath 08/15/11 with 30% mid LAD, o/w no CAD   Hearing loss    History of colon polyps    BENGIN   Left rotator cuff tear    OA (osteoarthritis)    KNEES AND LEFT SHOULDER   Paroxysmal atrial fibrillation (HCC)    PONV (postoperative nausea and vomiting)     Past Surgical History:  Procedure Laterality Date   ATRIAL FIBRILLATION ABLATION N/A 01/18/2019   Procedure: ATRIAL FIBRILLATION ABLATION;  Surgeon: Bradley Grayer, MD;  Location: [place];  Service: Cardiovascular;  Laterality: N/A;   CARDIAC CATHETERIZATION  08-15-2011  DR BENSIMHON   FALSE POSTIVE STRESS TEST--  MINIMAL  NON-BSTRUCTIVE CAD ,  mLAD 30%, LM congenitally absent (there was seprate ostia for the lad & the lcfx)/   NORMAL LVF   CARPAL TUNNEL RELEASE Right 1990   COLONOSCOPY     KNEE ARTHROSCOPY Right YRS AGO   LAPAROSCOPIC INGUINAL HERNIA REPAIR Right 09-05-2011   POLYPECTOMY     PROSTATE BIOPSY N/A 10/06/2019   Procedure: PROSTATE BIOPSY;  Surgeon: Royston Cowper, MD;  Location: [facility];  Service: Urology;  Laterality: N/A;   SHOULDER ARTHROSCOPY WITH SUBACROMIAL DECOMPRESSION AND BICEP TENDON REPAIR Left 05/12/2014   Procedure: LEFT ARTHROSCOPY SHOULDER WITH EXAM UNDER ANESTHESIA,SUBACROMIAL DECOMPRESSION DISTAL CLAVICAL RESECTION LABIAL REPAIR VERSES DEBRIDEMENT;  Surgeon: Sydnee Cabal, MD;  Location: [place];  Service: Orthopedics;  Laterality: Left;   TRANSTHORACIC ECHOCARDIOGRAM  08-19-2011   MILD LVH/  EF 03-47%/  GRADE I DIASTOLIC DYSFUNCTION    Current Medications: Current Meds  Medication Sig   atorvastatin (LIPITOR) 20 MG tablet TAKE 1 TABLET BY MOUTH  DAILY   B Complex Vitamins (VITAMIN-B COMPLEX) TABS Take 1 tablet by mouth daily.   diltiazem (CARDIZEM) 30 MG tablet Take 1 tablet (30 mg total) by mouth as directed. Take one tablet by mouth every 4 hours AS NEEDED for A-fib HR > 100 as long as BP > 100.   ELIQUIS 5 MG TABS tablet TAKE 1 TABLET BY MOUTH  TWICE DAILY   GRAPE SEED EXTRACT PO  Take by mouth in the morning and at bedtime.   Lycopene 10 MG CAPS Take by mouth.   Multiple Vitamins-Minerals (MULTIVITAMIN WITH MINERALS) tablet Take 1 tablet by mouth daily.   potassium chloride SA (KLOR-CON M) 20 MEQ tablet Take 1 tablet (20 mEq total) by mouth daily.     Allergies:   Shellfish allergy and Sulfa antibiotics   Social History   Socioeconomic History   Marital status: Married    Spouse name: Not on file   Number of children: Not on file   Years of education: Not on file   Highest education level: Not on file  Occupational History   Occupation: Diet  Processor    Employer: [employer]  Tobacco Use   Smoking status: Never   Smokeless tobacco: Never  Vaping Use   Vaping Use: Never used  Substance and Sexual Activity   Alcohol use: No    Alcohol/week: 0.0 standard drinks of alcohol   Drug use: No   Sexual activity: Not on file  Other Topics Concern   Not on file  Social History Narrative   Graduate of [employer].  Married - '85.  1 son - '70,  2 daughters - '74, '87;   5 grandchildren.  Hobbies - keeps horses and ponies.  Marriage - good health            Social Determinants of Health   Financial Resource Strain: Not on file  Food Insecurity: Not on file  Transportation Needs: Not on file  Physical Activity: Not on file  Stress: Not on file  Social Connections: Not on file     Family History: The patient's family history includes COPD in his father; Cancer in his father; Diabetes in his mother; Heart disease in his father; Throat cancer in his father. There is no history of Colon cancer, Prostate cancer, Rectal cancer, or Stomach cancer.  ROS:   Please see the history of present illness.     All other systems reviewed and are negative.  EKGs/Labs/Other Studies Reviewed:    The following studies were reviewed today:  ECHO 2018: - Normal LV size with EF 55-60%. Moderate diastolic dysfunction.    Normal RV size and systolic function. No significant valvular    abnormalities.    NUC 2019:  Normal  EKG: Prior EKG showed sinus rhythm with bundle branch block.  Recent Labs: No results found for requested labs within last 365 days.  Recent Lipid Panel    Component Value Date/Time   CHOL 130 08/10/2020 1340   TRIG 341 (H) 08/10/2020 1340   HDL 49 08/10/2020 1340   CHOLHDL 2.7 08/10/2020 1340   VLDL 16 09/10/2018 0439   LDLCALC 45 08/10/2020 1340   LDLDIRECT 102.0 02/18/2016 1647     Risk Assessment/Calculations:              Physical Exam:    VS:  BP 124/70   Pulse 69   Ht '5\' 11"'$  (1.803  m)   Wt 198 lb 6.4 oz (90 kg)   SpO2 98%   BMI 27.67 kg/m     Wt Readings from Last 3 Encounters:  06/04/22 198 lb 6.4 oz (90 kg)  04/16/22 199 lb (90.3 kg)  01/18/21 207 lb (93.9 kg)     GEN:  Well nourished, well developed in no acute distress HEENT: Normal NECK: No JVD; No carotid bruits LYMPHATICS: No lymphadenopathy CARDIAC: RRR, no murmurs, no rubs, gallops RESPIRATORY:  Clear to auscultation without rales,  wheezing or rhonchi  ABDOMEN: Soft, non-tender, non-distended MUSCULOSKELETAL:  No edema; No deformity  SKIN: Warm and dry NEUROLOGIC:  Alert and oriented x 3 PSYCHIATRIC:  Normal affect   ASSESSMENT:    1. Paroxysmal atrial fibrillation (HCC)   2. Chronic anticoagulation   3. Pure hypercholesterolemia    PLAN:    In order of problems listed above:  Paroxysmal atrial fibrillation ([place]) Underwent atrial fibrillation ablation in 2020 with Dr. Rayann Gilbert.  Overall doing well.  Continuing with Eliquis 5 mg twice a day.  Off of metoprolol 25 mg now.  Has diltiazem 30 mg to take as needed.  Chronic anticoagulation Eliquis 5 mg twice a day for prescription drug management.  High risk medication management, lab work, monitoring hemoglobin and creatinine closely all of which are stable.  Pure hypercholesterolemia Continuing with atorvastatin 20 mg a day.  No myalgias.  Last LDL 45.  Excellent at goal.  Continue with current prescription drug management.         Medication Adjustments/Labs and Tests Ordered: Current medicines are reviewed at length with the patient today.  Concerns regarding medicines are outlined above.  No orders of the defined types were placed in this encounter.  No orders of the defined types were placed in this encounter.   Patient Instructions  Medication Instructions: Your physician recommends that you continue on your current medications as directed. Please refer to the Current Medication list given to you today.  *If you need a refill  on your cardiac medications before your next appointment, please call your pharmacy*  Follow-Up: At [facility], you and your health needs are our priority.  As part of our continuing mission to provide you with exceptional heart care, we have created designated Provider Care Teams.  These Care Teams include your primary Cardiologist (physician) and Advanced Practice Providers (APPs -  Physician Assistants and Nurse Practitioners) who all work together to provide you with the care you need, when you need it.   Your next appointment:   1 year(s)  The format for your next appointment:   In Person  Provider:   Candee Furbish, MD   Important Information About Sugar                Signed, Bradley Furbish, MD  06/04/2022 9:56 AM    [place]

## 2022-06-26 ENCOUNTER — Other Ambulatory Visit: Payer: Self-pay | Admitting: Cardiology

## 2022-08-11 ENCOUNTER — Other Ambulatory Visit: Payer: Self-pay

## 2022-08-11 ENCOUNTER — Other Ambulatory Visit: Payer: Self-pay | Admitting: *Deleted

## 2022-08-11 MED ORDER — POTASSIUM CHLORIDE CRYS ER 20 MEQ PO TBCR: 20.0000 meq | EXTENDED_RELEASE_TABLET | Freq: Every day | ORAL | 3 refills | Status: DC

## 2022-08-11 MED ORDER — APIXABAN 5 MG PO TABS: 5.0000 mg | ORAL_TABLET | Freq: Two times a day (BID) | ORAL | 0 refills | Status: DC

## 2022-08-11 NOTE — Telephone Encounter (Signed)
Prescription refill request for Eliquis received. Indication:Afib Last office visit:7/23 YWX:IPPND Labs Age: 71 Weight:90 kg  Prescription refilled

## 2022-08-11 NOTE — Telephone Encounter (Signed)
Prescription refill request for Eliquis received. Indication: Afib  Last office visit: 06/04/22 Marlou Porch)  Scr: 1.00 (08/10/20)  Age: 71 Weight: 90kg  Labs overdue. Called PCP to see if labs have been completed within in the past year.

## 2022-08-12 ENCOUNTER — Other Ambulatory Visit: Payer: Self-pay | Admitting: Pharmacist

## 2022-08-12 MED ORDER — APIXABAN 5 MG PO TABS: 5.0000 mg | ORAL_TABLET | Freq: Two times a day (BID) | ORAL | 0 refills | Status: DC

## 2022-09-02 ENCOUNTER — Other Ambulatory Visit: Payer: Self-pay

## 2022-09-02 DIAGNOSIS — I480 Paroxysmal atrial fibrillation: Secondary | ICD-10-CM

## 2022-09-04 ENCOUNTER — Ambulatory Visit: Payer: Medicare Other | Attending: Cardiology

## 2022-09-04 DIAGNOSIS — I480 Paroxysmal atrial fibrillation: Secondary | ICD-10-CM

## 2022-09-05 LAB — BASIC METABOLIC PANEL
BUN/Creatinine Ratio: 22 (ref 10–24)
BUN: 21 mg/dL (ref 8–27)
CO2: 24 mmol/L (ref 20–29)
Calcium: 9.9 mg/dL (ref 8.6–10.2)
Chloride: 103 mmol/L (ref 96–106)
Creatinine, Ser: 0.96 mg/dL (ref 0.76–1.27)
Glucose: 107 mg/dL — ABNORMAL HIGH (ref 70–99)
Potassium: 4.2 mmol/L (ref 3.5–5.2)
Sodium: 139 mmol/L (ref 134–144)
eGFR: 84 mL/min/{1.73_m2} (ref >59)

## 2022-09-05 LAB — CBC
Hematocrit: 43.8 % (ref 37.5–51.0)
Hemoglobin: 14.9 g/dL (ref 13.0–17.7)
MCH: 32 pg (ref 26.6–33.0)
MCHC: 34 g/dL (ref 31.5–35.7)
MCV: 94 fL (ref 79–97)
Platelets: 180 10*3/uL (ref 150–450)
RBC: 4.66 x10E6/uL (ref 4.14–5.80)
RDW: 12.3 % (ref 11.6–15.4)
WBC: 5.3 10*3/uL (ref 3.4–10.8)

## 2022-09-08 ENCOUNTER — Other Ambulatory Visit: Payer: Medicare Other

## 2022-11-13 ENCOUNTER — Other Ambulatory Visit: Payer: Self-pay | Admitting: Cardiology

## 2022-11-13 DIAGNOSIS — I480 Paroxysmal atrial fibrillation: Secondary | ICD-10-CM

## 2022-11-13 NOTE — Telephone Encounter (Signed)
Eliquis '5mg'$  refill request received. Patient is 71 years old, weight-90kg, Crea-0.96 on 09/04/2022, Diagnosis-Afib, and last seen by Dr. Marlou Porch on 06/04/2022. Dose is appropriate based on dosing criteria. Will send in refill to requested pharmacy.

## 2023-06-26 ENCOUNTER — Other Ambulatory Visit: Payer: Self-pay

## 2023-06-26 ENCOUNTER — Telehealth: Payer: Self-pay | Admitting: Cardiology

## 2023-06-26 MED ORDER — POTASSIUM CHLORIDE CRYS ER 20 MEQ PO TBCR: 20.0000 meq | EXTENDED_RELEASE_TABLET | Freq: Every day | ORAL | 1 refills | Status: DC

## 2023-06-26 MED ORDER — ATORVASTATIN CALCIUM 20 MG PO TABS: 20.0000 mg | ORAL_TABLET | Freq: Every day | ORAL | 1 refills | Status: DC

## 2023-06-26 NOTE — Telephone Encounter (Signed)
*  STAT* If patient is at the pharmacy, call can be transferred to refill team.   1. Which medications need to be refilled? (please list name of each medication and dose if known) atorvastatin (LIPITOR) 20 MG tablet potassium chloride SA (KLOR-CON M) 20 MEQ tablet  2. Which pharmacy/location (including street and city if local pharmacy) is medication to be sent to? [facility]) - [place] [place] - [address] [place] [address]  3. Do they need a 30 day or 90 day supply?  90 day supply

## 2023-06-26 NOTE — Telephone Encounter (Signed)
Pt's medications were sent to pt's pharmacy as requested. Confirmation received.  

## 2023-09-02 ENCOUNTER — Encounter: Payer: Self-pay | Admitting: Gastroenterology

## 2023-09-22 ENCOUNTER — Other Ambulatory Visit: Payer: Self-pay | Admitting: Cardiology

## 2023-09-22 DIAGNOSIS — I480 Paroxysmal atrial fibrillation: Secondary | ICD-10-CM

## 2023-09-22 NOTE — Telephone Encounter (Signed)
Prescription refill request for Eliquis received. Indication:afib Last office visit:upcoming Scr:0.96  10/23 Age: 72 Weight:90  kg  Prescription refilled

## 2023-10-02 ENCOUNTER — Encounter: Payer: Self-pay | Admitting: Gastroenterology

## 2023-11-17 ENCOUNTER — Ambulatory Visit: Payer: Medicare Other | Attending: Cardiology | Admitting: Cardiology

## 2023-11-17 ENCOUNTER — Encounter: Payer: Self-pay | Admitting: Cardiology

## 2023-11-17 VITALS — BP 116/70 | HR 71 | Ht 70.0 in | Wt 194.0 lb

## 2023-11-17 DIAGNOSIS — I2510 Atherosclerotic heart disease of native coronary artery without angina pectoris: Secondary | ICD-10-CM | POA: Diagnosis not present

## 2023-11-17 NOTE — Patient Instructions (Signed)

## 2023-11-17 NOTE — Progress Notes (Signed)
  Cardiology Office Note:  .   Date:  11/17/2023  ID:  Bradley Gilbert, DOB 02/01/1951, MRN [id number] PCP: Lavada Mesi, MD  [facility] Providers Cardiologist:  Donato Schultz, MD Electrophysiologist:  Hillis Range, MD (Inactive)     History of Present Illness: .   Bradley Gilbert is a 72 y.o. male Discussed with the use of AI scribe   History of Present Illness   The patient, a 72 year old with a history of paroxysmal atrial fibrillation post-ablation in 2020, nonobstructive coronary artery disease diagnosed in 2012, hypertension, hyperlipidemia, and chronic left bundle branch block, presents for follow-up. He also has a history of prostate cancer. The patient's coronary anatomy is notable for the left main and LAD having separate ostia.  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 He also enjoys visiting [employer].  The patient's current medication regimen includes Eliquis 5mg  twice daily and as-needed Diltiazem 30mg .         Studies Reviewed: Marland Kitchen   EKG Interpretation Date/Time:  Tuesday November 17 2023 10:05:22 EST Ventricular Rate:  67 PR Interval:  138 QRS Duration:  150 QT Interval:  438 QTC Calculation: 462 R Axis:   67  Text Interpretation: Normal sinus rhythm Left bundle branch block When compared with ECG of 03-Oct-2019 12:06, T wave inversion now evident in Inferior leads Confirmed by Donato Schultz ([id number]) on 11/17/2023 10:14:50 AM    Results   RADIOLOGY Coronary Angiography: Nonobstructive coronary artery disease, left main and LAD having separate ostia (2012)  DIAGNOSTIC Ablation: Paroxysmal atrial fibrillation (2020)     Risk Assessment/Calculations:            Physical Exam:   VS:  BP 116/70   Pulse 71   Ht 5\' 10"  (1.778 m)   Wt 194 lb (88 kg)   SpO2 93%   BMI 27.84 kg/m    Wt Readings from Last 3 Encounters:  11/17/23 194  lb (88 kg)  06/04/22 198 lb 6.4 oz (90 kg)  04/16/22 199 lb (90.3 kg)    GEN: Well nourished, well developed in no acute distress NECK: No JVD; No carotid bruits CARDIAC: RRR, no murmurs, no rubs, no gallops RESPIRATORY:  Clear to auscultation without rales, wheezing or rhonchi  ABDOMEN: Soft, non-tender, non-distended EXTREMITIES:  No edema; No deformity   ASSESSMENT AND PLAN: .    Assessment and Plan    Paroxysmal Atrial Fibrillation Paroxysmal atrial fibrillation post ablation in 2020 with Dr. Johney Frame. Managed with Eliquis 5 mg twice daily and diltiazem 30 mg as needed. - Continue Eliquis 5 mg twice daily - Use diltiazem 30 mg as needed  Nonobstructive Coronary Artery Disease Nonobstructive coronary artery disease diagnosed in 2012. Left main and LAD have separate ostia.  Chronic Left Bundle Branch Block Chronic left bundle branch block.  Hypertension -Stable no changes    Hyperlipidemia Lipitor 20mg  once a day. No changes  Prostate Cancer Seeing urology soon.             Dispo: 1 yr  Signed, Donato Schultz, MD

## 2024-02-07 ENCOUNTER — Other Ambulatory Visit: Payer: Self-pay | Admitting: Cardiology

## 2024-09-12 ENCOUNTER — Other Ambulatory Visit: Payer: Self-pay | Admitting: Cardiology

## 2024-09-12 DIAGNOSIS — I480 Paroxysmal atrial fibrillation: Secondary | ICD-10-CM

## 2024-09-12 NOTE — Telephone Encounter (Signed)
 Prescription refill request for Eliquis  received. Indication:afib Last office visit:12/24 Drm:wzzid labs Age:  Weight: Prescription refilled

## 2024-10-05 ENCOUNTER — Other Ambulatory Visit: Payer: Self-pay | Admitting: Cardiology

## 2024-10-05 DIAGNOSIS — I480 Paroxysmal atrial fibrillation: Secondary | ICD-10-CM

## 2024-10-10 NOTE — Telephone Encounter (Signed)
 Prescription refill request for Eliquis  received. Indication: PAF Last office visit: 11/17/23  CHRISTELLA Parchment MD Scr: 0.9 on 02/28/23  [facility] Age: 73 Weight: 88kg  Based on above findings Eliquis  5mg  twice daily is the appropriate dose.  Pt is due for appt with Dr Parchment 10/2024.  Message sent to schedulers to make appt.  Needs BMP/CBC at that time.  Has not been for requested lab work. Refill approved x 2.

## 2024-11-08 ENCOUNTER — Other Ambulatory Visit: Payer: Self-pay | Admitting: Cardiology

## 2024-11-08 DIAGNOSIS — I480 Paroxysmal atrial fibrillation: Secondary | ICD-10-CM

## 2024-11-09 NOTE — Telephone Encounter (Signed)
 Prescription refill request for Eliquis  received. Indication:afib Last office visit:needs appt Scr: Age:  Weight:  Prescription refilled

## 2024-11-18 ENCOUNTER — Other Ambulatory Visit: Payer: Self-pay | Admitting: Cardiology

## 2024-12-18 ENCOUNTER — Other Ambulatory Visit: Payer: Self-pay | Admitting: Cardiology

## 2024-12-20 ENCOUNTER — Other Ambulatory Visit: Payer: Self-pay | Admitting: Cardiology

## 2024-12-22 ENCOUNTER — Other Ambulatory Visit: Payer: Self-pay | Admitting: Cardiology

## 2024-12-22 DIAGNOSIS — I480 Paroxysmal atrial fibrillation: Secondary | ICD-10-CM

## 2024-12-22 NOTE — Telephone Encounter (Signed)
 Pt must schedule an overdue followup appt with Cardiology for any more refills. [phone] 2ND attempt Thank You

## 2025-03-29 ENCOUNTER — Ambulatory Visit: Admitting: Cardiology
